# Patient Record
Sex: Female | Born: 1970 | Race: Black or African American | Hispanic: No | Smoking: Former smoker
Health system: Southern US, Community
[De-identification: ages and names within clinical notes are randomized; demographics above are authoritative.]

## PROBLEM LIST (undated history)

## (undated) DIAGNOSIS — G4730 Sleep apnea, unspecified: Secondary | ICD-10-CM

## (undated) DIAGNOSIS — M797 Fibromyalgia: Secondary | ICD-10-CM

## (undated) DIAGNOSIS — K219 Gastro-esophageal reflux disease without esophagitis: Secondary | ICD-10-CM

## (undated) DIAGNOSIS — E119 Type 2 diabetes mellitus without complications: Secondary | ICD-10-CM

## (undated) DIAGNOSIS — E079 Disorder of thyroid, unspecified: Secondary | ICD-10-CM

## (undated) DIAGNOSIS — G35 Multiple sclerosis: Secondary | ICD-10-CM

## (undated) HISTORY — PX: HERNIA REPAIR: SHX51

## (undated) HISTORY — PX: ENDOMETRIAL BIOPSY: SHX622

---

## 2003-02-01 ENCOUNTER — Emergency Department (HOSPITAL_COMMUNITY): Admission: EM | Admit: 2003-02-01 | Discharge: 2003-02-01 | Payer: Self-pay | Admitting: Emergency Medicine

## 2003-03-12 ENCOUNTER — Emergency Department (HOSPITAL_COMMUNITY): Admission: EM | Admit: 2003-03-12 | Discharge: 2003-03-12 | Payer: Self-pay | Admitting: Emergency Medicine

## 2003-04-04 ENCOUNTER — Emergency Department (HOSPITAL_COMMUNITY): Admission: EM | Admit: 2003-04-04 | Discharge: 2003-04-04 | Payer: Self-pay | Admitting: Emergency Medicine

## 2003-04-07 ENCOUNTER — Other Ambulatory Visit: Admission: RE | Admit: 2003-04-07 | Discharge: 2003-04-07 | Payer: Self-pay | Admitting: Obstetrics and Gynecology

## 2003-05-06 ENCOUNTER — Encounter: Payer: Self-pay | Admitting: Obstetrics and Gynecology

## 2003-05-06 ENCOUNTER — Ambulatory Visit (HOSPITAL_COMMUNITY): Admission: RE | Admit: 2003-05-06 | Discharge: 2003-05-06 | Payer: Self-pay | Admitting: Obstetrics and Gynecology

## 2003-07-16 ENCOUNTER — Ambulatory Visit (HOSPITAL_COMMUNITY): Admission: RE | Admit: 2003-07-16 | Discharge: 2003-07-16 | Payer: Self-pay | Admitting: Obstetrics and Gynecology

## 2003-07-16 ENCOUNTER — Encounter: Payer: Self-pay | Admitting: Obstetrics and Gynecology

## 2003-08-07 ENCOUNTER — Inpatient Hospital Stay (HOSPITAL_COMMUNITY): Admission: AD | Admit: 2003-08-07 | Discharge: 2003-08-07 | Payer: Self-pay | Admitting: Obstetrics and Gynecology

## 2003-10-20 ENCOUNTER — Inpatient Hospital Stay (HOSPITAL_COMMUNITY): Admission: AD | Admit: 2003-10-20 | Discharge: 2003-10-20 | Payer: Self-pay | Admitting: Obstetrics and Gynecology

## 2003-11-19 ENCOUNTER — Inpatient Hospital Stay (HOSPITAL_COMMUNITY): Admission: AD | Admit: 2003-11-19 | Discharge: 2003-11-19 | Payer: Self-pay | Admitting: Obstetrics and Gynecology

## 2003-11-22 ENCOUNTER — Inpatient Hospital Stay (HOSPITAL_COMMUNITY): Admission: AD | Admit: 2003-11-22 | Discharge: 2003-11-22 | Payer: Self-pay | Admitting: Obstetrics and Gynecology

## 2003-12-06 ENCOUNTER — Inpatient Hospital Stay (HOSPITAL_COMMUNITY): Admission: AD | Admit: 2003-12-06 | Discharge: 2003-12-08 | Payer: Self-pay | Admitting: Obstetrics and Gynecology

## 2004-01-10 ENCOUNTER — Other Ambulatory Visit: Admission: RE | Admit: 2004-01-10 | Discharge: 2004-01-10 | Payer: Self-pay | Admitting: Obstetrics and Gynecology

## 2004-09-18 ENCOUNTER — Encounter: Admission: RE | Admit: 2004-09-18 | Discharge: 2004-09-18 | Payer: Self-pay | Admitting: Internal Medicine

## 2004-10-18 ENCOUNTER — Encounter: Admission: RE | Admit: 2004-10-18 | Discharge: 2004-10-18 | Payer: Self-pay | Admitting: Neurology

## 2004-11-01 ENCOUNTER — Ambulatory Visit (HOSPITAL_COMMUNITY): Admission: RE | Admit: 2004-11-01 | Discharge: 2004-11-01 | Payer: Self-pay | Admitting: Neurology

## 2005-01-02 ENCOUNTER — Encounter: Admission: RE | Admit: 2005-01-02 | Discharge: 2005-02-01 | Payer: Self-pay | Admitting: Neurology

## 2005-01-10 ENCOUNTER — Other Ambulatory Visit: Admission: RE | Admit: 2005-01-10 | Discharge: 2005-01-10 | Payer: Self-pay | Admitting: Obstetrics and Gynecology

## 2005-02-11 ENCOUNTER — Ambulatory Visit: Payer: Self-pay | Admitting: Physical Medicine & Rehabilitation

## 2005-02-11 ENCOUNTER — Inpatient Hospital Stay (HOSPITAL_COMMUNITY): Admission: AD | Admit: 2005-02-11 | Discharge: 2005-02-13 | Payer: Self-pay | Admitting: Neurology

## 2005-08-15 ENCOUNTER — Emergency Department (HOSPITAL_COMMUNITY): Admission: EM | Admit: 2005-08-15 | Discharge: 2005-08-15 | Payer: Self-pay | Admitting: Emergency Medicine

## 2006-01-24 ENCOUNTER — Encounter: Admission: RE | Admit: 2006-01-24 | Discharge: 2006-01-24 | Payer: Self-pay | Admitting: Neurology

## 2006-03-07 ENCOUNTER — Encounter: Admission: RE | Admit: 2006-03-07 | Discharge: 2006-03-07 | Payer: Self-pay | Admitting: Neurology

## 2006-04-01 ENCOUNTER — Encounter: Admission: RE | Admit: 2006-04-01 | Discharge: 2006-06-30 | Payer: Self-pay | Admitting: Neurology

## 2006-04-28 ENCOUNTER — Inpatient Hospital Stay (HOSPITAL_COMMUNITY): Admission: EM | Admit: 2006-04-28 | Discharge: 2006-04-30 | Payer: Self-pay | Admitting: Emergency Medicine

## 2006-11-02 ENCOUNTER — Encounter: Admission: RE | Admit: 2006-11-02 | Discharge: 2006-11-02 | Payer: Self-pay | Admitting: *Deleted

## 2007-05-29 ENCOUNTER — Ambulatory Visit (HOSPITAL_COMMUNITY): Admission: RE | Admit: 2007-05-29 | Discharge: 2007-05-29 | Payer: Self-pay | Admitting: *Deleted

## 2007-10-26 ENCOUNTER — Encounter: Admission: RE | Admit: 2007-10-26 | Discharge: 2007-10-26 | Payer: Self-pay | Admitting: Neurology

## 2007-11-04 ENCOUNTER — Inpatient Hospital Stay (HOSPITAL_COMMUNITY): Admission: AD | Admit: 2007-11-04 | Discharge: 2007-11-04 | Payer: Self-pay | Admitting: Obstetrics and Gynecology

## 2007-12-03 ENCOUNTER — Ambulatory Visit: Payer: Self-pay | Admitting: Obstetrics & Gynecology

## 2007-12-03 ENCOUNTER — Encounter: Payer: Self-pay | Admitting: Obstetrics & Gynecology

## 2007-12-10 ENCOUNTER — Ambulatory Visit (HOSPITAL_COMMUNITY): Admission: RE | Admit: 2007-12-10 | Discharge: 2007-12-10 | Payer: Self-pay | Admitting: Obstetrics and Gynecology

## 2007-12-18 ENCOUNTER — Ambulatory Visit: Payer: Self-pay | Admitting: *Deleted

## 2008-01-29 ENCOUNTER — Ambulatory Visit: Payer: Self-pay | Admitting: Obstetrics and Gynecology

## 2008-06-13 ENCOUNTER — Inpatient Hospital Stay (HOSPITAL_COMMUNITY): Admission: EM | Admit: 2008-06-13 | Discharge: 2008-06-16 | Payer: Self-pay | Admitting: Neurology

## 2008-06-16 ENCOUNTER — Encounter: Admission: RE | Admit: 2008-06-16 | Discharge: 2008-06-16 | Payer: Self-pay | Admitting: Neurology

## 2009-01-24 ENCOUNTER — Emergency Department (HOSPITAL_BASED_OUTPATIENT_CLINIC_OR_DEPARTMENT_OTHER): Admission: EM | Admit: 2009-01-24 | Discharge: 2009-01-24 | Payer: Self-pay | Admitting: Emergency Medicine

## 2009-03-05 ENCOUNTER — Emergency Department (HOSPITAL_BASED_OUTPATIENT_CLINIC_OR_DEPARTMENT_OTHER): Admission: EM | Admit: 2009-03-05 | Discharge: 2009-03-05 | Payer: Self-pay | Admitting: Emergency Medicine

## 2009-03-07 ENCOUNTER — Other Ambulatory Visit: Admission: RE | Admit: 2009-03-07 | Discharge: 2009-03-07 | Payer: Self-pay | Admitting: Family Medicine

## 2009-04-19 ENCOUNTER — Encounter: Admission: RE | Admit: 2009-04-19 | Discharge: 2009-04-19 | Payer: Self-pay | Admitting: Neurology

## 2009-08-22 ENCOUNTER — Emergency Department (HOSPITAL_BASED_OUTPATIENT_CLINIC_OR_DEPARTMENT_OTHER): Admission: EM | Admit: 2009-08-22 | Discharge: 2009-08-22 | Payer: Self-pay | Admitting: Emergency Medicine

## 2009-09-08 ENCOUNTER — Encounter: Admission: RE | Admit: 2009-09-08 | Discharge: 2009-09-28 | Payer: Self-pay | Admitting: Neurology

## 2009-09-08 ENCOUNTER — Encounter: Admission: RE | Admit: 2009-09-08 | Discharge: 2009-09-08 | Payer: Self-pay | Admitting: Neurology

## 2010-03-25 ENCOUNTER — Emergency Department (HOSPITAL_BASED_OUTPATIENT_CLINIC_OR_DEPARTMENT_OTHER): Admission: EM | Admit: 2010-03-25 | Discharge: 2010-03-25 | Payer: Self-pay | Admitting: Emergency Medicine

## 2010-09-15 ENCOUNTER — Encounter
Admission: RE | Admit: 2010-09-15 | Discharge: 2010-09-15 | Payer: Self-pay | Source: Home / Self Care | Attending: Internal Medicine | Admitting: Internal Medicine

## 2010-10-22 ENCOUNTER — Encounter: Payer: Self-pay | Admitting: Neurology

## 2010-12-15 ENCOUNTER — Emergency Department (HOSPITAL_BASED_OUTPATIENT_CLINIC_OR_DEPARTMENT_OTHER)
Admission: EM | Admit: 2010-12-15 | Discharge: 2010-12-15 | Disposition: A | Discharge status: Home / Self Care | Payer: Medicare PPO | Attending: Emergency Medicine | Admitting: Emergency Medicine

## 2010-12-15 DIAGNOSIS — Z79899 Other long term (current) drug therapy: Secondary | ICD-10-CM | POA: Insufficient documentation

## 2010-12-15 DIAGNOSIS — M79609 Pain in unspecified limb: Secondary | ICD-10-CM | POA: Insufficient documentation

## 2010-12-15 DIAGNOSIS — L089 Local infection of the skin and subcutaneous tissue, unspecified: Secondary | ICD-10-CM | POA: Insufficient documentation

## 2010-12-17 LAB — BASIC METABOLIC PANEL
BUN: 9 mg/dL (ref 6–23)
CO2: 33 mEq/L — ABNORMAL HIGH (ref 19–32)
Chloride: 103 mEq/L (ref 96–112)
Creatinine, Ser: 0.8 mg/dL (ref 0.4–1.2)
Potassium: 3.9 mEq/L (ref 3.5–5.1)

## 2011-01-03 LAB — URINALYSIS, ROUTINE W REFLEX MICROSCOPIC
Ketones, ur: 15 mg/dL — AB
Specific Gravity, Urine: 1.039 — ABNORMAL HIGH (ref 1.005–1.030)

## 2011-01-03 LAB — PREGNANCY, URINE: Preg Test, Ur: NEGATIVE

## 2011-01-08 LAB — URINE CULTURE

## 2011-01-08 LAB — COMPREHENSIVE METABOLIC PANEL
ALT: 7 U/L (ref 0–35)
Calcium: 8.9 mg/dL (ref 8.4–10.5)
Creatinine, Ser: 0.8 mg/dL (ref 0.4–1.2)
GFR calc Af Amer: >60 mL/min (ref >60)
Glucose, Bld: 79 mg/dL (ref 70–99)
Sodium: 145 mEq/L (ref 135–145)
Total Protein: 8.4 g/dL — ABNORMAL HIGH (ref 6.0–8.3)

## 2011-01-08 LAB — URINALYSIS, ROUTINE W REFLEX MICROSCOPIC
Bilirubin Urine: NEGATIVE
Nitrite: NEGATIVE
Specific Gravity, Urine: 1.029 (ref 1.005–1.030)
Urobilinogen, UA: 0.2 mg/dL (ref 0.0–1.0)
pH: 5.5 (ref 5.0–8.0)

## 2011-01-08 LAB — URINE MICROSCOPIC-ADD ON

## 2011-01-08 LAB — PREGNANCY, URINE: Preg Test, Ur: NEGATIVE

## 2011-01-08 LAB — CBC
HCT: 34.7 % — ABNORMAL LOW (ref 36.0–46.0)
MCHC: 32.6 g/dL (ref 30.0–36.0)

## 2011-01-08 LAB — DIFFERENTIAL
Eosinophils Absolute: 0 10*3/uL (ref 0.0–0.7)
Lymphocytes Relative: 45 % (ref 12–46)
Lymphs Abs: 2.8 10*3/uL (ref 0.7–4.0)
Monocytes Relative: 9 % (ref 3–12)
Neutrophils Relative %: 45 % (ref 43–77)

## 2011-02-13 NOTE — Group Therapy Note (Signed)
NAME:  [name]       ACCOUNT NO.:  [id number]   MEDICAL RECORD NO.:  [id number]          PATIENT TYPE:  WOC   LOCATION:  [facility]                   FACILITY:  [facility]   PHYSICIAN:  Allie Bossier, MD        DATE OF BIRTH:  [birth year]   DATE OF SERVICE:  12/03/2007                                  CLINIC NOTE   [name] as a 40 year old divorced black gravida 1, para 1, with a 4-year-  old daughter, who complains of heavy irregular and long periods since  the birth of her daughter 4 years ago.  Prior to that, she was on oral  contraceptive pills and had regular periods.  She has also had a  lifelong issue with hirsutism, and she was seen in the [facility] for this  bleeding on November 04, 2007.  At that time, she was slightly anemic,  with hemoglobin being 10.6.  She was given a Depo-Provera injection and  a appropriate prescription for p.o. Provera.   PAST MEDICAL HISTORY:  1. Morbid obesity.  2. Hirsutism.  3. Multiple sclerosis diagnosed by Dr. Avie George.  4. Cervical dysplasia at age 9.  5. Anemia.  6. Menorrhagia.   PAST SURGICAL HISTORY:  1. Umbilical hernia repair.  2. Cryosurgery.   ALLERGIES:  No allergies to medicines.  No latex allergies.   REVIEW OF SYSTEMS:  She had a Pap smear in 2007 at Dr. Ashley George' office  that she reports as normal.  She is sexually active and uses condoms  100% of the time.  She is on disability secondary to multiple sclerosis.  She has been monogamous for less than a month, and has lost 10 pounds  recently.   PHYSICAL EXAMINATION:  VITAL SIGNS:  Weight 295 pounds, height 5 feet 4  inches, pulse 97, blood pressure 127/74.  HEENT:  Normal.  HEART:  Regular rate and rhythm.  BREASTS:  Normal bilaterally.  ABDOMEN:  Obese.  No palpable organs.  PELVIC:  External genitalia normal.  Cervix appears normal.  Bimanual  exam:  I am not able to palpate any masses.   ASSESSMENT AND PLAN:  Menorrhagia, with resulting anemia, along with  hirsutism and morbid obesity.  It is quite likely that she has  polycystic ovarian syndrome.  I certainly would recommend weight loss as  general health maintenance exam.  For her gynecology health, I have  checked a Pap smear.  With regard to her bleeding, I have  checked a repeat CBC and a TSH, as well as cervical cultures and a  random glucose due to her morbid obesity.  She will follow up in several  weeks.      Allie Bossier, MD     MCD/MEDQ  D:  12/18/2007  T:  12/18/2007  Job:  [id number]

## 2011-02-13 NOTE — Group Therapy Note (Signed)
[name], [name]       ACCOUNT NO.:  [id number]   MEDICAL RECORD NO.:  [id number]          PATIENT TYPE:  WOC   LOCATION:  [facility]                   FACILITY:  [facility]   PHYSICIAN:  Karlton Lemon, MD      DATE OF BIRTH:  1971-08-22   DATE OF SERVICE:                                  CLINIC NOTE   CHIEF COMPLAINT:  Follow-up of results.   HISTORY OF PRESENT ILLNESS:  This is a 40 year old, gravida 1, para 1  with a history of menorrhagia, seen by Dr. Marice Potter on December 03, 2007.  She  states that her bleeding continued after she saw Dr. Marice Potter, until December 12, 2007.  She had bled for approximately 2 1/2 weeks at that time.  She  has a history of missed periods and then heavy bleeding in the past.  Her assessment involved Pap smear, which was negative for  intraepithelial lesion, ultrasound showing no evidence of fibroids.  The  uterus appeared normal, the ovaries appeared normal and there were no  adnexal masses.  She had DC and chlamydia that was negative.  Hemoglobin  10.0, platelets 337, glucose 79, TSH 1.278.  White blood count was  normal.  The patient has been counseled about these results.  She has  been started on slow FE iron replacement for her anemia.   OBSTETRIC HISTORY:  She is gravida 1, para 1.   PAST MEDICAL HISTORY:  She has multiple sclerosis, fibromyalgia and  arthritis and menorrhagia.   PHYSICAL EXAMINATION:  Well appearing female, in no distress.  Temperature 98.5, pulse 84, blood pressure 118/78, weight 292.5.   ASSESSMENT AND PLAN:  The patient is here for results and counseling of  labs performed before.  She is a 40 year old gravida 1, para 1 with  menorrhagia.  She was previously normal when she was on birth control.  The results of her testing from her previous visits are normal, except  for mild to moderate anemia.  She has been started on iron for this iron  deficiency anemia.  She will be started on oral contraceptive pills,  Sprintec to help  control her cycle.  She has been instructed to start  this Sunday and she is to follow-up in 3 months to assess how well this  method is doing to control her periods.           ______________________________  Karlton Lemon, MD     NS/MEDQ  D:  12/18/2007  T:  12/18/2007  Job:  [id number]

## 2011-02-13 NOTE — Discharge Summary (Signed)
[name], [name]       ACCOUNT NO.:  [id number]   MEDICAL RECORD NO.:  [id number]          PATIENT TYPE:  INP   LOCATION:  3004                         FACILITY:  [facility]   PHYSICIAN:  Genene Churn. Love, M.D.    DATE OF BIRTH:  01/06/1971   DATE OF ADMISSION:  06/13/2008  DATE OF DISCHARGE:  06/16/2008                               DISCHARGE SUMMARY   This is one of multiple [facility] admissions for this 40-year-  old right-handed African American single female admitted from the  emergency room on June 13, 2008, by Dr. Noel Christmas for  suspected multiple sclerosis attack.   HISTORY OF PRESENT ILLNESS:  [name] has a history of optic neuritis  in the past and transverse myelitis with chronic gait disorder and  intermittent speech difficulties secondary to multiple sclerosis.  She  has had documented lesions in the brain and the spinal cord.  She was  initially treated with Avonex therapy and subsequently switched to Rebif  because of worsening condition.  She was in her usual state of health,  walking with a walker and independent activities of daily living when  she developed worsening gait and speech difficulties on June 12, 2008.  She was seen in the emergency room by Dr. Noel Christmas on  June 13, 2008, and admitted for evaluation.  She had no associated  Lhermitte sign, or bowel or bladder symptomatology.   PAST MEDICAL HISTORY:  1. Pain in her joints, arms, and legs thought to represent      fibromyalgia.  2. Obesity.  3. Obstructive sleep apnea syndrome.  4. Right proximal leg discomfort thought to represent meralgia      paresthetica.  5. Umbilical hernia repair.  6. Anxiety disorder.   MEDICATIONS AT THE TIME OF ADMISSION INCLUDED:  1. Provigil 200 mg q.a.m.  2. Rebif 44 mcg on Mondays, Wednesdays, and Fridays.  3. Skelaxin 400 mg t.i.d.  4. Effexor XR 75 mg daily.  5. Baclofen 30 mg t.i.d.  6. Lyrica 100 mg t.i.d.  7. Aleve  220 mg two tablets daily.  8. OxyContin 20 mg in the morning and 10 mg in the evening.   PHYSICAL EXAMINATION:  VITAL SIGNS:  At the time of her admission  revealed temperature of 98.5; pulse 85, markedly obese female in no  acute distress with blood pressure of 131/91.  GENERAL:  She was oriented to x3.  She follows 1, 2, and 3-step  commands.  HEENT:  She has difficulty with speech output, had foreign accent  syndrome.  She left out articles in her speech.  Her visual fields were  full.  The extraocular movements were full.  Corneal's are present.  Hearing was intact.  Air conduction greater than bone conduction.  NEUROLOGIC:  Motor examination revealed she had ataxia in both upper and  lower extremities with 1 to 2+ deep tendon reflexes.  Plantar responses  downgoing.  EXTREMITIES:  Normal according to Dr. Roseanne Reno when she was admitted.   LABORATORY DATA:  CBC with white blood cell count of 11,000, hemoglobin  10.2, hematocrit 32.1, and platelet count 328,000.  Protime was  14.0  with an INR of 1.1, and PTT was 27.  Composite metabolic panel reveals  sodium of 139, potassium 3.6, chloride 106, CO2 content 27, glucose 81,  and BUN 9.  Liver function tests were normal.  Albumin was 3.3 and  calcium was 8.8.  Chest x-ray with 2 views on June 13, 2008, showed  no evidence of active disease.   HOSPITAL COURSE:  The patient was admitted with suspected acute attack  of demyelinating disease and placed on IV Solu-Medrol 1 g daily for 3  days to be followed by p.o. prednisone taper.  Because of her size, she  could not have an MRI study in the hospital and it was set up as an  outpatient for [facility] imaging to be performed of the brain and of  the cervical spine without and with contrast enhancement.  During her  hospital course, she was treated with Lovenox 40 mg subcu daily and it  was noted that she had been again developing improvement in her speech  following Solu-Medrol and  was able to walk with a cane following the  second dosage.  She used her CPAP machine in the hospital for her  obstructive sleep apnea syndrome.   IMPRESSION:  1. Multiple sclerosis, code 340.  2. Obstructive sleep apnea syndrome, code 780.57.  3. Obesity, code 278.01.   PLAN AT THIS TIME:  Discharge the patient for an MRI this day on  baclofen 30 mg t.i.d.; Provigil 200 mg daily; Skelaxin 400 mg t.i.d.;  OxyContin 20 mg in the morning and 10 mg at night; Effexor XR 75 mg  daily; Lyrica 100 mg t.i.d.; and Protonix 80 mg daily; Rebif 44 mcg  subcu Mondays, Wednesdays, and Fridays; prednisone taper 60 mg daily for  3 days, 40 mg daily for 3 days, and then 20 mg daily for 4 days.  She  will return to the office in 10 days for followup evaluation.  Pending  studies at the time of discharge will be a serum for interferon  antibodies.           ______________________________  Genene Churn. Sandria Manly, M.D.     JML/MEDQ  D:  06/16/2008  T:  06/16/2008  Job:  [id number]

## 2011-02-13 NOTE — H&P (Signed)
NAME:  [name], [name]       ACCOUNT NO.:  [id number]   MEDICAL RECORD NO.:  [id number]          PATIENT TYPE:  WOC   LOCATION:  [facility]                          FACILITY:  [facility]   PHYSICIAN:  Noel Christmas, MD    DATE OF BIRTH:  09/30/1971   DATE OF ADMISSION:  DATE OF DISCHARGE:                              HISTORY & PHYSICAL   This is a 40 year old African-American lady with a history of multiple  sclerosis, the patient of Dr. Sandria Manly, who began experiencing speech  difficulty yesterday as well as difficulty walking.  Symptoms were worse  on waking up today.  She has experienced problems with getting words  out, in addition to requiring assistance with ambulation including with  use of her cane.  She has not experienced any numbness involving the  right side.  She has known demyelinating lesions involving brain and  spinal cord and has a history of transverse myelitis.  Her last  admission for multiple sclerosis exacerbation was in July 2007.  She had  a good response to a brief treatment of high dose Solu-Medrol followed  by prednisone taper as an outpatient.  This is her first exacerbation  apparently since the hospitalization.  The patient has a history also of  fibromyalgia and chronic pain.  She has been on Rebif 44 mcg on Monday,  Wednesday, and Friday of each week.  She typically walks without a cane  unless she has problems with hip or lower extremity pain otherwise.  She  has not developed any bowel or bladder control problems.  The patient  has not experienced swallowing difficulty.   PAST MEDICAL HISTORY:  In addition to multiple sclerosis is remarkable  for recurrent low back pain as well as fibromyalgia, chronic obesity,  umbilical hernia repair, and chronic anxiety disorder as well as chronic  depression.   CURRENT MEDICATIONS:  1. Provigil 200 mg q.a.m.  2. Rebif 44 mcg on Monday, Wednesday, and Friday of each week.  3. Skelaxin 400 mg t.i.d.  4. Effexor XR 75  mg per day.  5. Baclofen 30 mg t.i.d.  6. Lyrica 100 mg t.i.d.  7. Aleve 220 mg 2 tablets daily.  8. OxyContin 20 mg q.a.m. and 10 mg q.p.m.   FAMILY HISTORY:  Noncontributory.   REVIEW OF SYSTEMS:  Negative except for chronic somatic pain syndrome  associated with fibromyalgia and chronic low back pain in addition to  obesity.   PHYSICAL EXAMINATION:  VITAL SIGNS:  Temperature is 98.5, pulse was 85  per minute, respirations 18 per minute, and blood pressure was 131/91.  GENERAL:  The appearance was that of a markedly obese young lady who is  alert and cooperative in no acute distress.  She was oriented to time as  well as place.  She had significant problems with speech output, but  clearly knew what she wanted to say.  There were no frank paraphasic  errors.  She had no difficulty understanding spoken language.  HEAD, EYES, EARS, NOSE, AND THROAT:  Remarkable.  NECK:  Supple with no masses or tenderness.  CHEST:  Clear to auscultation.  CARDIAC:  Reveals normal rate and  rhythm and normal heart sounds.  ABDOMEN:  Soft and distended.  Bowel sounds were normal.  EXTREMITIES:  Obese and symmetrical.  She had normal peripheral pulses  including dorsalis pedis pulses in both feet which were 2+.  GENITALS and RECTUM:  Deferred.   Her pupils reacted normally to light.  Extraocular movements and visual  fields were normal.  There was no facial weakness.  Hearing was normal.  Speech as described above was very hesitant and there was spasticity of  speech output along with some frustration and not being able to get out  what she wanted to say.  Palette and movement was symmetrical.  Coordination of extremities was normal.  Motor exam showed mild hip  flexor weakness on the right compared to the left.  She had equivocal  quadriceps weakness on the right as well compared to the left.  Tibialis  anterior and gastrocnemius strength was normal.  Strength of her right  upper extremity and left  upper and lower extremities was normal.  Muscle  tone was normal throughout.  Deep tendon reflexes were 2+, brisk, and  symmetrical in the upper extremities throughout the knees and 2+ of  ankles.  Plantar responses were flexor.  Sensory examination was normal.  Carotid auscultation was normal.   CLINICAL IMPRESSION:  1. Exacerbation of multiple sclerosis manifested by speech difficulty      and gait instability acutely.  2. Chronic fibromyalgia.  3. Chronic obesity.   PLAN:  1. IV administration of Solu-Medrol 1 g per day with total of 3 doses      followed by prednisone taper as an outpatient.  2. Physical therapy intervention for gait evaluation recommendations.  3. Speech therapy intervention for evaluation and recommendations as      well for speech output difficulty.      Noel Christmas, MD  Electronically Signed     CS/MEDQ  D:  06/13/2008  T:  06/14/2008  Job:  [id number]

## 2011-02-13 NOTE — Group Therapy Note (Signed)
NAMEDAVINE, SWENEY       ACCOUNT NO.:  [id number]   MEDICAL RECORD NO.:  [id number]          PATIENT TYPE:  WOC   [facility]ATION[facility]                   [facility]AC[facility]   PHYSICIAN:  Argentina Donovan, MD        DATE OF BIRTH:  20-Jan-1971   DATE OF SERVICE:  01/29/2008                                  CLINIC NOTE   The patient is a 40 year old English speaking Hispanic female, gravida  1, para 1-0-0-1 with multiple sclerosis for which she is on many  medications.  She comes in because she wants an antiviral to suppress  her recurrent episodes of herpes; however, she has only two within a  year.  I have talked to her about the possibility of using Zovirax, and  she wants a prescription for that.  She also has had problems with Depo-  Provera and birth control pills with breakthrough bleeding, possibly due  to her weight of 297 pounds.  I have convinced her to fill out the  papers for the [facility] program and see if we did a Mirena IUD for her which  I think would be better.  Her third complaint is a knot on her labia  which is nontender.  We examined that, and it turned out to be a small  sebaceous cyst.   IMPRESSION:  Multiple sclerosis with morbid obesity.   PLAN:  Uterine contraceptive device, i.e., Mirena if the patient  qualifies and a prescription for Valtrex.           ______________________________  Argentina Donovan, MD     PR/MEDQ  D:  01/29/2008  T:  01/29/2008  Job:  [id number]

## 2011-02-16 NOTE — Discharge Summary (Signed)
NAME:  [name], [name]                       ACCOUNT NO.:  [id number]   MEDICAL RECORD NO.:  [id number]                   PATIENT TYPE:  INP   LOCATION:  9109                                 [facility]   PHYSICIAN:  James A. Ashley Royalty, M.D.             DATE OF BIRTH:  [birth year]   DATE OF ADMISSION:  12/06/2003  DATE OF DISCHARGE:  12/08/2003                                 DISCHARGE SUMMARY   DISCHARGE DIAGNOSES:  1. Intrauterine pregnancy at term, delivered.  2. Spontaneous rupture of membranes.  3. Group B Streptococcus carrier.  4. Term birth living child, vertex.   OPERATIONS AND SPECIAL PROCEDURES:  OB delivery with episiotomy and  episiorrhaphy.   COMPLICATIONS OF DELIVERY:  Shoulder dystocia - relieved by extending the  episiotomy, suprapubic pressure, and McRoberts maneuver.   CONSULTATIONS:  None.   DISCHARGE MEDICATIONS:  Tylenol.   HISTORY AND PHYSICAL:  This is a 40 year old primigravida, 2 days before her  due date.  Prenatal care complicated by group B strep positive and apparent  cervical stenosis.  The patient presented complaining of labor with onset at  4 a.m.  For the remainder of the history and physical please see chart.   HOSPITAL COURSE:  The patient was admitted to [facility] of  [place].  Admission laboratory studies were drawn.  Initial cervical  examination revealed the cervix to be closed, 90-100% effaced, -1 station,  vertex presentation.  It felt stenotic.  Spontaneous rupture of membranes  was documented.  The patient went on to labor.  On December 06, 2003 at 3:05  p.m. she was noted to be 5 cm, 100% effaced, +1 station, vertex  presentation.  A forebag was ruptured and revealed meconium.  Amnioinfusion  was initiated.  The patient went on to deliver at 6:29 p.m. on December 06, 2003.  The infant was an 8-pound 1-ounce female, Apgars 8 at one minute and  9 five minutes, sent to newborn nursery.  Delivery was complicated by  shoulder  dystocia which was relieved by extending the episiotomy, suprapubic  pressure, and McRoberts maneuvers.  The infant was subjected to DeLee  suction on the perineum.  Pediatrics team was present at delivery.  Arterial  cord pH was obtained and the value was 7.31.  The patient's postpartum  course was benign.  She was discharged home on postpartum day #2, afebrile  and in satisfactory condition.   ACCESSORY CLINICAL FINDINGS:  Hemoglobin and hematocrit on admission were  11.3 and 35.1 respectively.  Repeat values obtained on December 08, 2003 were  8.9 and 27.3 respectively.  White blood cell count on the day of discharge  was 9600.   DISPOSITION:  The patient is to [facility] in 4-6  weeks for postpartum evaluation.  James A. Ashley Royalty, M.D.    [name]  D:  12/29/2003  T:  12/29/2003  Job:  [id number]

## 2011-02-16 NOTE — Discharge Summary (Signed)
[name], [name]       ACCOUNT NO.:  [id number]   MEDICAL RECORD NO.:  [id number]          PATIENT TYPE:  INP   LOCATION:  5015                         FACILITY:  [facility]   PHYSICIAN:  Genene Churn. Love, M.D.    DATE OF BIRTH:  07-04-1971   DATE OF ADMISSION:  02/11/2005  DATE OF DISCHARGE:                                 DISCHARGE SUMMARY   PATIENT ADDRESS:  [address]  [place], [place]  [id number].   This is one of several [facility] admissions for this 40 year old  right-handed African-American female from [place], [place],  admitted for evaluation of bilateral lower extremity pain.   HISTORY OF PRESENT ILLNESS:  [name] has a known history of  multiple sclerosis complicated by a gait disorder.  She has recently  developed problems with pain occurring in her joints with swelling, on a  tapering course of steroids for treatment of multiple sclerosis.  She was  admitted by Dr. Vickey Huger on Feb 11, 2005, to [facility] with  complaints of difficulty walking.  There was no history of single-eye visual  loss, double vision, hiccups, swallowing problems, etc.  She was admitted  for treatment with IV steroids, considering this possibly an MS attack.  She  had no known history of alcoholism or cigarette use and has not had any  serious history of medical problems.  She has had a past history of an  intrauterine pregnancy which delivered in March 2005, a group B  streptococcus carrier and multiple sclerosis.  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  Her other  details of her history have been dictated elsewhere.   PHYSICAL EXAMINATION:  VITAL SIGNS:  At the time of admission, her vital  signs were normal.  There was some difficulty in feeling a pulse in her left  radial, but she felt pulses in the popliteal.  MENTAL STATUS:  Her speech revealed a foreign accent syndrome,  which sounded  almost [place].  NEUROLOGICAL:  Her cranial nerve examination revealed the visual fields to  be full.  Discs were flat with spontaneous venous pulsations seen.  Extraocular movements were full, and corneals were present.  There was no  facial motor asymmetry.  Hearing was present with air conduction greater  than bone conduction.  Tongue was midline.  The uvula was midline.  Gags  were present.  Motor examination revealed 5/5 strength in the upper and  lower extremities.  She complained of pain in her wrists and in her ankles  bilaterally.  Her strength was in the 5/5 range.  Deep tendon reflexes were  2+, and plantar responses were downgoing.  GENERAL:  Examination was unremarkable.   LABORATORY DATA:  Her white blood cell count was 16,200 while on steroids,  hemoglobin 12.8, hematocrit 38.5, platelet count 353K.  The differential was  69% polys, 25% lymphs and 5% monocytes.  Sed rate was about 25.  ANA is  pending, but a previous ANA, about four months ago, was unremarkable.  Initial sodium was 134, potassium 4.1, chloride 104, CO2 content 24, glucose  199 with a repeat of 157, BUN 13, creatinine 1.0, calcium 8.5.  Uric acid  5.1.  Serum protein electrophoresis unremarkable.  Her CRP was high with  sensitivity 29.2.  Urine drug screen was unremarkable.  Urinalysis was  negative.  RA factor was less than 20.  Bone scan was negative.  X-rays of  the lower extremities revealed a right ankle medial malleolar fracture which  was old.  Chest x-ray showed no significant abnormalities.   HOSPITAL COURSE:  The patient was thought most likely to have had a  tenosynovitis versus MS exacerbation and was treated with 500 mg of IV Solu-  Medrol q.d. x 3 days with definite improvement in symptomatology.  She was  seen by physical therapy, who felt that she was ambulating 200 feet and  would do well as an outpatient.  She was seen in consultation by Dr.  Jimmy Footman from rheumatology,  who recommended that her diagnosis was  synovitis of the ankles and considered doing a Parvovirus.  ANA is pending  at the time of this dictation.   IMPRESSION:  1.  Bilateral lower extremity pain, code 729.5, most likely representing      tenosynovitis.  2.  Multiple sclerosis, code 340.  3.  Obesity, code 278.01.   PLAN:  At this time is to discharge the patient on a tapering course of  prednisone 60 mg q.d. for 2 days, 40 mg q.d. for 3 days and 20 mg q.d. for 3  days; Cymbalta 30 mg q.h.s. is added to the regimen; Avonex 30 mcg IM q.  weekly; Flexeril 10 mg up to t.i.d. for pain.  She will be out of work until  Feb 19, 2005, and then begin six-hour days.  She will return to me on Feb 20, 2005, for further evaluation.  Pending studies at this time are  Parvovirus and ANA.      JML/MEDQ  D:  02/13/2005  T:  02/13/2005  Job:  [id number]   cc:   Marcene Duos, M.D.  [place]  [place]  [place] [address]  Fax: [phone]

## 2011-02-16 NOTE — H&P (Signed)
NAME:  [name], [name]                       ACCOUNT NO.:  [id number]   MEDICAL RECORD NO.:  [id number]                   PATIENT TYPE:  MAT   LOCATION:  MATC                                 FACILITY:  [facility]   PHYSICIAN:  Timothy P. Fontaine, M.D.           DATE OF BIRTH:  17-Sep-1971   DATE OF ADMISSION:  08/07/2003  DATE OF DISCHARGE:                                HISTORY & PHYSICAL   CHIEF COMPLAINT:  Cough, sputum.   HISTORY OF PRESENT ILLNESS:  A 40 year old G1, P0 female at approximately 21  to 22 weeks with a 1 week history of increasing cough and sputum production.  The patient notes that her sputum is clear and she has been having a  worsening cough since its onset about a week ago. She has no fevers or  chills. No nausea, vomiting, diarrhea or constipation. She has no urinary  tract symptoms and no symptoms attributable to the pregnancy such as  cramping, vaginal bleeding or discharge changes. The patient actually was  evaluated a family practitioner with auscultation and was told that her  lungs were clear 2 days ago and she presented because she was taking  Robitussin and did not seem to be getting better. For the remainder of her  past history, see her [facility].   PHYSICAL EXAMINATION:  VITAL SIGNS:  Afebrile. Vital signs are stable.  HEENT:  Normal.  LUNGS:  Clear.  CARDIAC:  Regular rate without rubs, murmurs, or gallops.  ABDOMEN:  Appropriate uterine size, gestational age with positive fetal  heart tones. Soft, nontender without masses, guarding or rebound or  organomegaly.  PELVIC:  Speculum of pelvic shows normal appearing discharge. Cervix is long  and closed. GC and Chlamydia screen done.   LABORATORY DATA:  Urinalysis shows greater than 80 ketones with moderate  leukocyte esterase. Microscopic shows 3 to 6 WBC's, 0 to 2 RBC's, few  bacteria, few squamous and trichomonas.   ASSESSMENT:  Viral upper respiratory infection. Lungs are clear. Discussed  with  patient, the appropriate over-the-counter medications and she is going  to try these to see if this does not help. IV hydration x1 liter due to the  ketonuria, although the patient does note that she has been drinking. Will  go ahead and hydrate her. Low level changes on her urinalysis. Will go ahead  and check a urine culture. Do not feel strong enough that we need to treat  at this time as she is asymptomatic and again, the urinalysis is low level.  Will go ahead and check culture and then treat as appropriate. Lastly, she  does have trichomonas on her urinalysis and I discussed this with her. A GC  and Chlamydia screen was done and I will go ahead and treat her with Flagyl  2 gram single dose after appropriate  discussion with her as far as the risks and benefits of mediation exposure  during pregnancy, which she understands  and accepts. The patient's questions  are answered to her satisfaction and she will followup with Dr. Ashley Royalty  after this coming week and they will followup on the culture results, both  GC and Chlamydia, as well as her urine culture.                                               Timothy P. Audie Box, M.D.    TPF/MEDQ  D:  08/07/2003  T:  08/07/2003  Job:  [id number]   cc:   Fayrene Fearing A. Ashley Royalty, M.D.  [address]  [place], [place] [address]  Fax: [phone]

## 2011-02-16 NOTE — Op Note (Signed)
NAMECHELCY, BOLDA             ACCOUNT NO.:  [id number]   MEDICAL RECORD NO.:  [id number]          PATIENT TYPE:  OUT   LOCATION:  [facility]C                          FACILITY:  [facility]   PHYSICIAN:  Genene Churn. Love, M.D.    DATE OF BIRTH:  28-Jan-1971   DATE OF PROCEDURE:  11/01/2004  DATE OF DISCHARGE:                                 OPERATIVE REPORT   CLINICAL INFORMATION:  This patient has had symptoms of radiculopathy and is  being evaluated for the possibility of a demyelinating disorder.   PHYSICIAN:  Genene Churn. Love, M.D.   DESCRIPTION OF PROCEDURE:  The patient was prepped and draped in the left  lateral decubitus position using Betadine and 1% Xylocaine.  L4-5 interspace  was entered.  She noted some numbness initially in her left foot along the  sole, and this was repositioned.  Opening pressure was 210 mmH20.  Initially  one drop of blood that was sort of pink, but clear CSF was obtained and sent  for protein, glucose, cell count, differential, angiotensin converting  enzyme, IgG, oligoclonal IgG, anti-DNA and anti-neuronal DNA.  The patient  tolerated the procedure well.      JML/MEDQ  D:  11/01/2004  T:  11/01/2004  Job:  [id number]

## 2011-02-16 NOTE — Discharge Summary (Signed)
NAMEANAYI, [name]       ACCOUNT NO.:  [id number]   MEDICAL RECORD NO.:  [id number]          PATIENT TYPE:  INP   LOCATION:  3003                         FACILITY:  [facility]   PHYSICIAN:  Genene Churn. Love, M.D.    DATE OF BIRTH:  01-07-1971   DATE OF ADMISSION:  04/28/2006  DATE OF DISCHARGE:  04/30/2006                                 DISCHARGE SUMMARY   This is one of several [facility] admissions for this 40 year old  right-handed black female with a history of multiple sclerosis. She has a  past history of optic neuritis and symptoms of transverse myelitis with pain  in her legs at times thought to represent fibromyalgia and followed by Dr.  Jimmy Footman. She has had documented lesions of the brain and spinal cord with  recent study in June 2007 showing a new lesion in the left medullary portion  of the brain stem. At that time she was switched from Avonex to Rebif. She  has been on disability because of discomfort in her legs from suspected  fibromyalgia versus multiple sclerosis. Over the last week-and-a-half she  has had difficulty with lower back pain. She awoke early on the morning of  April 28, 2006 having fallen out of bed and noted initially that she had  numbness in both legs which resolved in the right leg, and she was left with  numbness in her left leg. She had no associated bowel or bladder  dysfunction. She came to the emergency room and was admitted by Dr. Anne Hahn.   PAST MEDICAL HISTORY:  Significant for multiple sclerosis, new onset left  greater than right lower extremity numbness, fibromyalgia, obesity,  umbilical hernia repair, anxiety, and depression.   MEDICATIONS ON ADMISSION:  Rebif 44 mcg three times per week, Lexapro 20 mg  once per day, Baclofen 20 mg three times per day, Skelaxin 400 mg three  times per day, vitamin D supplement, Lexapro 100 mg t.i.d.   Other details of her history have been dictated elsewhere.   PHYSICAL EXAMINATION:   NEUROLOGICAL:  Examination at time of admission  revealed that her strength was good in her lower extremities. She had pain  with elevation of her left leg. She had altered sensation of her left leg  all the way to the hip level. Otherwise, examination was unremarkable.   LABORATORY DATA:  Sodium 436, potassium 3.7, chloride 100, CO2 31, glucose  81, BUN 8, creatinine 0.9. Calcium 8.4. Total protein 6.7. Albumin 3.2. SGOT  23, SGPT 17. Alk phos 58, total bilirubin 0.7. White blood cell count 8900,  hemoglobin 11.7, hematocrit 35.7, platelet count 256K. Differential was 63%  polys, 31% lymphs, 5% monocytes, 1% eosinophils. Sed rate was elevated at  47.   X-rays of the chest, lumbar spine, and left hip showed no evidence of any  significant abnormalities.  An MRI study of the lumbar spine without and  with contrast enhancement showed no significant abnormalities. EKG showed  normal sinus rhythm and was in normal EKG. Urinalysis is pending at the time  of dictation.   HOSPITAL COURSE:  The patient was admitted and placed on high-dose IV  Solu-  Medrol. Has had significant improvement in symptomatology. Initial concerns  were that she had injured herself with a fall out of bed. Other  possibilities was an acute MS attack. Recent complaints of back pain and  elevated sed rate raise the question of fibromyalgia as a potential cause  for her discomfort. In the hospital her numbness resolved in the left leg,  and she was able to walk in the room without assistance. She was taking  oxycodone without Tylenol for pain relief.   IMPRESSION:  1.  Low back pain, code 724.4.  2.  Left leg numbness, code 782.0.  3.  Multiple sclerosis with active disease by MRI study June 2007, code 340.  4.  Obesity, code 278.01.  5.  Depression, code 311.  6.  Fibromyalgia, code 729.1.   PLAN:  At this time is discharge the patient on tapering course of  prednisone and oxycodone 10 mg q.4 h.  p.r.n. pain. Return  to me in two  weeks as an outpatient for further evaluation.   DISCHARGE MEDICATIONS:  Rebif 44 mcg subcu three times a week, Lexapro 20 mg  daily, Baclofen 20 mg t.i.d., Skelaxin 400 mg three times per day, vitamin D  supplement, prednisone 10 mg, five day steroid taper, oxycodone 10 mg q.4 h.  p.r.n. pain, Tums or antacids daily, and Lyrica 100 mg t.i.d.   She is discharged improved from prehospital status with urinalysis pending  at the time of dictation.           ______________________________  Genene Churn. Sandria Manly, M.D.     JML/MEDQ  D:  04/30/2006  T:  04/30/2006  Job:  [id number]

## 2011-02-16 NOTE — H&P (Signed)
NAMERONA, TOMSON       ACCOUNT NO.:  [id number]   MEDICAL RECORD NO.:  [id number]          PATIENT TYPE:  INP   LOCATION:  1828                         FACILITY:  [facility]   PHYSICIAN:  Marlan Palau, M.D.  DATE OF BIRTH:  10/30/1970   DATE OF ADMISSION:  04/28/2006  DATE OF DISCHARGE:                                HISTORY & PHYSICAL   NEUROLOGY ADMISSION NOTE   HISTORY OF PRESENT ILLNESS:  [name] is a 40 year old, right-  handed, black female born 1970/12/10, with a history of multiple  sclerosis followed by Dr. Sandria Manly.  This patient has had optic neuritis in the  past and central complaints, also felt to have fibromyalgia.  The patient  has recently been documented to have acute lesions in the brain and spinal  cord by MRI studies and was switched from Avonex to Rebif.  The patient has  been on disability for the last two to three months because of this.  The  patient comes in at this point with a one week history of severe back pain  and pain down the left leg.  Dr. Sandria Manly had set the patient up to have an MRI  scan of the lumbosacral spine to be done on the 29th of July, but patient  comes into the emergency room at this point for evaluation of gait disorder.  The patient claims that she has been fully ambulatory up until last evening.  The patient apparently fell out of bed around 1 or 2 in the morning.  The  patient has had noted numbness of both legs when she fell, but notes that  the right leg has improved.  The left leg has remained quite numb, cannot  feel the leg at all, has severe pain in the back and down the left leg at  this point, and she cannot walk at this time.  The patient denies any  problems controlling the bowels or the bladder, the patient is not sure  whether there may be some weakness present or not.  The patient denies any  neck pain or pain in the arms, numbness in the arms.  The patient comes to  the emergency room for an  evaluation of the worsening problems with pain in  the back and leg, the difficulty with ambulation, and numbness in the leg.  The patient claims the numbness includes the entirety of the leg up into the  hip area, does not include the body.   PAST MEDICAL HISTORY:  1.  History of multiple sclerosis.  2.  New onset of left greater than right lower extremity numbness, weakness,      severe back pain, and left leg pain.  3.  Fibromyalgia.  4.  Obesity.  5.  Umbilical hernia repair.  6.  Anxiety disorder.   CURRENT MEDICATIONS:  1.  Rebif three times a week.  2.  Lexapro 20 mg a day.  3.  Baclofen 20 mg three times a day.  4.  Skelaxin 400 mg three times a day.  5.  Vitamin D supplementation.   The patient has no known drug allergies.  Does  not smoke or drink.   SOCIAL HISTORY:  This patient lives in the [place], [place], area,  is on short-term disability, has a 68-year-old child, is divorced.   FAMILY MEDICAL HISTORY:  Father died with lung cancer.  Mother is alive with  hypertension and degenerative arthritis.  The patient has one brother, who  has degenerative arthritis.  One sister died from TTP.   REVIEW OF SYSTEMS:  Notable for no recent fevers, chills.  The patient does  note a slight headache two days ago.  Denies neck pain, has slight shortness  of breath, denies chest pain, denies any problems controlling the bowels or  the bladder, denies any abdominal pain, numbness on the body.  The patient  has not had any blackout episodes or dizziness.   PHYSICAL EXAMINATION:  VITAL SIGNS:  Blood pressure is 132/93, heart rate is  77, respiratory rate 20, temperature afebrile.  GENERAL:  This patient is a markedly obese, black female, who is alert and  cooperative at the time of the examination.  HEENT:  Head is atraumatic.  Eyes:  Pupils are equal, round, and reactive to  light.  Disks are flat bilaterally.  NECK:  Supple, no carotid bruits noted.  RESPIRATORY  EXAMINATION:  Clear.  CARDIOVASCULAR EXAMINATION:  Regular rate and rhythm, no obvious murmurs or  rubs noted.  EXTREMITIES:  Without significant edema.  ABDOMEN:  Obese, nontender, positive bowel sounds.  NEUROLOGIC EXAMINATION:  Cranial nerves as above.  Facial symmetry is  present.  The patient has good sensation of the face to pinprick and soft  touch bilaterally, has good strength of the facial muscles, muscles of the  head, turning and shoulder shrug bilaterally.  Speech is well-enunciated and  not aphasic.  Again, extraocular movements are full.  Motor testing appears  to show good strength on all fours, including the left lower extremity.  The  patient notes decreased pinprick sensation in the entirety of the left leg  as compared to the right.  Vibratory sensation is depressed on the leg as  compared to the right.  Pinprick, soft touch, and vibratory sensation is  symmetric in the arms.  The patient has good finger-to-nose-to-finger and  toe-to-finger bilaterally.  No drift is seen in the upper extremities.  Deep  tendon reflexes are present but symmetric.  Toes are neutral bilaterally.  The patient does note pain with elevation of the leg and with internal and  external rotation of the hip.  Much of this patient's complaints of pain is  around the left hip.   IMPRESSION:  1.  History of multiple sclerosis.  2.  Back pain and left leg pain.  3.  Status post fall with worsening pain in the left leg, and numbness, both      legs, left greater than right.   This patient really has minimal findings objectively on examination but does  need to be considered for workup for a possible herniated lumbosacral disk.  The patient claims that she is no longer ambulatory.  The patient claims  numbness in the entirety of the left leg, had numbness on both legs  initially.  I suppose a multiple sclerosis attack does need to be  considered.  The patient's multiple sclerosis has been quite  active  recently.  PLAN:  1.  Admission to [facility].  2.  X-ray of the left hip.  3.  MRI scan of the lumbosacral spine.  4.  Admission blood work.  5.  IV steroids and analgesics.   I will follow the patient closely, of course, while in house.      Marlan Palau, M.D.  Electronically Signed     CKW/MEDQ  D:  04/28/2006  T:  04/28/2006  Job:  [id number]   cc:   Marcene Duos, M.D.  [facility]

## 2011-02-16 NOTE — Consult Note (Signed)
NAMEBRYNNLY, BONET             ACCOUNT NO.:  [id number]   MEDICAL RECORD NO.:  [id number]          PATIENT TYPE:  INP   LOCATION:  5015                         FACILITY:  [facility]   PHYSICIAN:  Lemmie Evens, M.D.DATE OF BIRTH:  [birth year]   DATE OF CONSULTATION:  02/13/2005  DATE OF DISCHARGE:  02/13/2005                                   CONSULTATION   [name] is a 40 year old African-American female who works in the  laboratory at [employer] who has been hospitalized for an evaluation  of bilateral ankle and leg pain.  In addition, it was felt she could have a  flare-up of her multiple sclerosis.   The patient has had a diagnosis of multiple sclerosis since January of this  year.  She did have an abnormal brain MRI scan suggesting demyelinating  process.  Initially, she did have marked difficulty walking with frequent  muscle discomfort as well as spinal pain.  She has had optic neuritis with  blurriness of vision.  Over the last few months she has required  intermittent infusion of high dose Medrol as well as short courses of oral  prednisone.   Approximately a month ago the patient developed discomfort in the hips,  legs, and even ankle region.  She has not had pain or swelling in the upper  extremity.  The pain in the ankle area became so severe that she found it  difficult to walk without assistance over the course of the past week.   The patient has noticed swelling in the ankle joint region.  She has not had  definite skin rash, mouth sores, chest or abdominal complaints, or evidence  of Raynaud's phenomenon.  She denies numbness in the lower extremity,  although she has had some numbness in the hands and arms.   The patient does have a history of GERD.  She does take medication for that.  Also, she has been using injectable Interferon weekly.  She has had no prior  history of reaction to that.   The patient does have a 60-month-old daughter.   Apparently the daughter has  been in good health.  Patient does have a sister with lupus.   The patient has not had fevers, sweats, chills, cough, severe sore throat,  or diarrhea.   In the initial phase of the hospitalization it was noted that her hemoglobin  was 12.8, white cell count 16,200, platelets 353,000, sedimentation rate 5,  creatinine 1, CRP 29, uric acid 5.  Quantitative immunoglobulins for IgM,  IgG, and IgA were within normal limits.  Urinalysis was normal.  Rheumatoid  factor was negative.  Drug screen was negative.  Chest x-ray appeared  normal.  X-ray of both ankles showed bony fragments over the medial and  lateral malleolar bone in the left ankle and in the medial aspect of the  right ankle.   Patient was told that her ANA was negative in January.  Also, she did  undergo a bone scan during the hospitalization.  There is a note in the  chart that it was negative.   Patient did  receive a two-day course of Solu-Medrol 500 mg given  intravenously.   Patient is feeling somewhat better at this time with less pain and swelling  in the lower extremity joints.   PHYSICAL EXAMINATION:  GENERAL:  The patient appears to be healthy and well-  nourished.  EXTREMITIES:  Good hand grip bilaterally.  There is no swelling of the MCP  or PIP joint.  Wrist, elbow, and shoulder examination appear normal.  Knee,  ankle, and foot examination reveal good mobility without joint swelling.  She does have good motion of both ankle joints.  There is some tenderness on  palpation of the posterolateral aspect of the right ankle.  Hip examination  revealed good hip flexion, extension, abduction, adduction, internal, and  external rotation.  Muscle strength testing revealed 5/5 strength in the  biceps, triceps, deltoid, and lower extremity muscle groups.  Skin  examination revealed no definite rash on the trunk, face, or extremity.   ASSESSMENT:  The patient has had a history of pain in both  ankle joints with  some swelling.  This could be a reactive arthritis which has improved  significantly with parenteral Medrol.  Certainly, one would have to rule out  a drug side effect or even exposure to a recent infection such as a viral  infection.  I did order Parvovirus, B19, IgG, and IgM antibody, __________  therapy, and CPK.  The patient would benefit by continued physical therapy  to help with lower extremity muscle strengthening.  It is very doubtful that  the patient has lupus with reported negative ANA about three months ago.  Also, she does not have the clinical features for rheumatoid arthritis in  that she has no involvement of the upper extremity joints.  Sarcoidosis is  less likely with a normal chest x-ray.   The patient may continue to use Voltaren 50 mg once or twice a day for  control of joint discomfort.      JJZ/MEDQ  D:  02/13/2005  T:  02/13/2005  Job:  [id number]

## 2011-03-12 ENCOUNTER — Emergency Department (HOSPITAL_BASED_OUTPATIENT_CLINIC_OR_DEPARTMENT_OTHER)
Admission: EM | Admit: 2011-03-12 | Discharge: 2011-03-12 | Disposition: A | Discharge status: Home / Self Care | Payer: Medicare PPO | Attending: Emergency Medicine | Admitting: Emergency Medicine

## 2011-03-12 DIAGNOSIS — Z79899 Other long term (current) drug therapy: Secondary | ICD-10-CM | POA: Insufficient documentation

## 2011-03-12 DIAGNOSIS — L0231 Cutaneous abscess of buttock: Secondary | ICD-10-CM | POA: Insufficient documentation

## 2011-03-14 ENCOUNTER — Emergency Department (HOSPITAL_BASED_OUTPATIENT_CLINIC_OR_DEPARTMENT_OTHER)
Admission: EM | Admit: 2011-03-14 | Discharge: 2011-03-14 | Disposition: A | Discharge status: Home / Self Care | Payer: Medicare PPO | Attending: Emergency Medicine | Admitting: Emergency Medicine

## 2011-03-14 DIAGNOSIS — L02419 Cutaneous abscess of limb, unspecified: Secondary | ICD-10-CM | POA: Insufficient documentation

## 2011-03-14 DIAGNOSIS — IMO0001 Reserved for inherently not codable concepts without codable children: Secondary | ICD-10-CM | POA: Insufficient documentation

## 2011-03-14 DIAGNOSIS — G35 Multiple sclerosis: Secondary | ICD-10-CM | POA: Insufficient documentation

## 2011-03-14 DIAGNOSIS — L03119 Cellulitis of unspecified part of limb: Secondary | ICD-10-CM | POA: Insufficient documentation

## 2011-03-14 LAB — GLUCOSE, CAPILLARY: Glucose-Capillary: 104 mg/dL — ABNORMAL HIGH (ref 70–99)

## 2011-03-18 LAB — CULTURE, ROUTINE-ABSCESS: Gram Stain: NONE SEEN

## 2011-06-07 ENCOUNTER — Other Ambulatory Visit: Payer: Self-pay | Admitting: Neurology

## 2011-06-07 DIAGNOSIS — M5410 Radiculopathy, site unspecified: Secondary | ICD-10-CM

## 2011-06-07 DIAGNOSIS — G5710 Meralgia paresthetica, unspecified lower limb: Secondary | ICD-10-CM

## 2011-06-07 DIAGNOSIS — R269 Unspecified abnormalities of gait and mobility: Secondary | ICD-10-CM

## 2011-06-07 DIAGNOSIS — G35 Multiple sclerosis: Secondary | ICD-10-CM

## 2011-06-22 LAB — SAMPLE TO BLOOD BANK

## 2011-06-22 LAB — CBC
HCT: 32.5 — ABNORMAL LOW
Hemoglobin: 10.6 — ABNORMAL LOW
MCHC: 32.7
RBC: 4.18
RDW: 16 — ABNORMAL HIGH

## 2011-06-22 LAB — WET PREP, GENITAL: Clue Cells Wet Prep HPF POC: NONE SEEN

## 2011-06-22 LAB — GC/CHLAMYDIA PROBE AMP, GENITAL: GC Probe Amp, Genital: NEGATIVE

## 2011-06-25 ENCOUNTER — Ambulatory Visit
Admission: RE | Admit: 2011-06-25 | Discharge: 2011-06-25 | Disposition: A | Discharge status: Home / Self Care | Payer: Medicare PPO | Source: Ambulatory Visit | Attending: Neurology | Admitting: Neurology

## 2011-06-25 DIAGNOSIS — G35 Multiple sclerosis: Secondary | ICD-10-CM

## 2011-06-25 DIAGNOSIS — G5710 Meralgia paresthetica, unspecified lower limb: Secondary | ICD-10-CM

## 2011-06-25 DIAGNOSIS — R269 Unspecified abnormalities of gait and mobility: Secondary | ICD-10-CM

## 2011-06-25 DIAGNOSIS — M5410 Radiculopathy, site unspecified: Secondary | ICD-10-CM

## 2011-06-25 MED ORDER — GADOBENATE DIMEGLUMINE 529 MG/ML IV SOLN
20.0000 mL | Freq: Once | INTRAVENOUS | Status: AC | PRN
  Administered 2011-06-25: 20 mL via INTRAVENOUS

## 2011-06-26 LAB — POCT PREGNANCY, URINE
Operator id: 297281
Preg Test, Ur: NEGATIVE

## 2011-07-04 LAB — MISCELLANEOUS TEST

## 2011-07-04 LAB — CBC
HCT: 32.1 — ABNORMAL LOW
Hemoglobin: 10.2 — ABNORMAL LOW
MCHC: 31.8
Platelets: 328
RDW: 16.9 — ABNORMAL HIGH

## 2011-07-04 LAB — PROTIME-INR
INR: 1.1
Prothrombin Time: 14

## 2011-07-04 LAB — COMPREHENSIVE METABOLIC PANEL
Albumin: 3.3 — ABNORMAL LOW
BUN: 9
Calcium: 8.8
Glucose, Bld: 81
Potassium: 3.6
Sodium: 139
Total Protein: 7

## 2011-07-04 LAB — APTT: aPTT: 27

## 2011-11-15 ENCOUNTER — Ambulatory Visit
Admission: RE | Admit: 2011-11-15 | Discharge: 2011-11-15 | Disposition: A | Discharge status: Home / Self Care | Payer: Medicare PPO | Source: Ambulatory Visit | Attending: Neurology | Admitting: Neurology

## 2011-11-15 ENCOUNTER — Other Ambulatory Visit: Payer: Self-pay | Admitting: Neurology

## 2011-11-15 DIAGNOSIS — R52 Pain, unspecified: Secondary | ICD-10-CM

## 2012-07-22 ENCOUNTER — Emergency Department (HOSPITAL_BASED_OUTPATIENT_CLINIC_OR_DEPARTMENT_OTHER)
Admission: EM | Admit: 2012-07-22 | Discharge: 2012-07-22 | Disposition: A | Discharge status: Home / Self Care | Payer: Medicare PPO | Attending: Emergency Medicine | Admitting: Emergency Medicine

## 2012-07-22 ENCOUNTER — Encounter (HOSPITAL_BASED_OUTPATIENT_CLINIC_OR_DEPARTMENT_OTHER): Payer: Self-pay

## 2012-07-22 DIAGNOSIS — Z79899 Other long term (current) drug therapy: Secondary | ICD-10-CM | POA: Insufficient documentation

## 2012-07-22 DIAGNOSIS — J329 Chronic sinusitis, unspecified: Secondary | ICD-10-CM | POA: Insufficient documentation

## 2012-07-22 DIAGNOSIS — G35 Multiple sclerosis: Secondary | ICD-10-CM | POA: Insufficient documentation

## 2012-07-22 DIAGNOSIS — E079 Disorder of thyroid, unspecified: Secondary | ICD-10-CM | POA: Insufficient documentation

## 2012-07-22 DIAGNOSIS — E119 Type 2 diabetes mellitus without complications: Secondary | ICD-10-CM | POA: Insufficient documentation

## 2012-07-22 DIAGNOSIS — G4730 Sleep apnea, unspecified: Secondary | ICD-10-CM | POA: Insufficient documentation

## 2012-07-22 DIAGNOSIS — Z791 Long term (current) use of non-steroidal anti-inflammatories (NSAID): Secondary | ICD-10-CM | POA: Insufficient documentation

## 2012-07-22 DIAGNOSIS — R51 Headache: Secondary | ICD-10-CM

## 2012-07-22 HISTORY — DX: Type 2 diabetes mellitus without complications: E11.9

## 2012-07-22 HISTORY — DX: Sleep apnea, unspecified: G47.30

## 2012-07-22 HISTORY — DX: Disorder of thyroid, unspecified: E07.9

## 2012-07-22 HISTORY — DX: Fibromyalgia: M79.7

## 2012-07-22 HISTORY — DX: Multiple sclerosis: G35

## 2012-07-22 MED ORDER — FLUCONAZOLE 150 MG PO TABS: 150.0000 mg | ORAL_TABLET | Freq: Once | ORAL | Status: AC

## 2012-07-22 MED ORDER — SODIUM CHLORIDE 0.9 % IV BOLUS (SEPSIS)
1000.0000 mL | Freq: Once | INTRAVENOUS | Status: AC
  Administered 2012-07-22: 1000 mL via INTRAVENOUS

## 2012-07-22 MED ORDER — MORPHINE SULFATE 4 MG/ML IJ SOLN
4.0000 mg | Freq: Once | INTRAMUSCULAR | Status: AC
  Administered 2012-07-22: 4 mg via INTRAVENOUS
  Filled 2012-07-22: qty 1

## 2012-07-22 MED ORDER — AMOXICILLIN 500 MG PO CAPS: 500.0000 mg | ORAL_CAPSULE | Freq: Three times a day (TID) | ORAL | Status: DC

## 2012-07-22 MED ORDER — ONDANSETRON HCL 4 MG/2ML IJ SOLN
4.0000 mg | Freq: Once | INTRAMUSCULAR | Status: AC
  Administered 2012-07-22: 4 mg via INTRAVENOUS
  Filled 2012-07-22: qty 2

## 2012-07-22 NOTE — ED Notes (Signed)
Pt reports a headache x 1 week. 

## 2012-07-22 NOTE — ED Provider Notes (Signed)
History     CSN: [id number]  Arrival date & time 07/22/12  1805   First MD Initiated Contact with Patient 07/22/12 1836      Chief Complaint  Patient presents with  . Headache     HPI Patient presents to the emergency room with complaints of left-sided headache. The headache has been ongoing for the last week progressively getting worse. She has been having sinus congestion and pressure on the left side of her face. She has been trying antihistamines and decongestants as well as Flonase nasal spray without relief. She has not had any fevers or neck pain. She has had some nausea and vomiting that she attributes to the postnasal drip. She says that she gets this about once a year  She does have a history of multiple sclerosis and recently started a steroid taper for an exacerbation associated with speech deficits and extremity weakness. She states usually she does not having trouble with headaches associated with her MS Past Medical History  Diagnosis Date  . MS (multiple sclerosis)   . Thyroid disease   . Fibromyalgia   . Sleep apnea   . Diabetes mellitus without complication     Past Surgical History  Procedure Date  . Hernia repair     No family history on file.  History  Substance Use Topics  . Smoking status: Never Smoker   . Smokeless tobacco: Not on file  . Alcohol Use: No    OB History    Grav Para Term Preterm Abortions TAB SAB Ect Mult Living                  Review of Systems  All other systems reviewed and are negative.    Allergies  Review of patient's allergies indicates no known allergies.  Home Medications   Current Outpatient Rx  Name Route Sig Dispense Refill  . BACLOFEN 10 MG PO TABS Oral Take 20 mg by mouth 2 (two) times daily.    Marland Kitchen CETIRIZINE HCL 10 MG PO TABS Oral Take 10 mg by mouth daily.    Marland Kitchen FINGOLIMOD HCL 0.5 MG PO CAPS Oral Take by mouth.    . FLUOXETINE HCL 20 MG PO CAPS Oral Take 20 mg by mouth daily.    Marland Kitchen FLUTICASONE  PROPIONATE 50 MCG/ACT NA SUSP Nasal Place 2 sprays into the nose daily.    Marland Kitchen LEVOTHYROXINE SODIUM 75 MCG PO TABS Oral Take 75 mcg by mouth daily.    . MELOXICAM 7.5 MG PO TABS Oral Take 7.5 mg by mouth daily.    Marland Kitchen METFORMIN HCL 500 MG PO TABS Oral Take 500 mg by mouth 2 (two) times daily with a meal.    . MODAFINIL 200 MG PO TABS Oral Take 200 mg by mouth daily.    . MORPHINE SULFATE ER 30 MG PO CP24 Oral Take 30 mg by mouth every morning.    Marland Kitchen MORPHINE SULFATE ER 50 MG PO CP24 Oral Take 50 mg by mouth at bedtime as needed.    Marland Kitchen PERCOCET PO Oral Take 20 mg by mouth daily.    Marland Kitchen PREGABALIN 75 MG PO CAPS Oral Take 75 mg by mouth 2 (two) times daily.      BP 147/77  Pulse 98  Temp 98.1 F (36.7 C) (Oral)  Resp 18  Ht 5\' 4"  (1.626 m)  Wt 230 lb (104.327 kg)  BMI 39.48 kg/m2  SpO2 99%  Physical Exam  Nursing note and vitals reviewed. Constitutional: She  appears well-developed and well-nourished. No distress.  HENT:  Head: Normocephalic and atraumatic.  Right Ear: External ear normal.  Left Ear: External ear normal.       Left maxillary sinus tenderness to palpation  Eyes: Conjunctivae normal are normal. Right eye exhibits no discharge. Left eye exhibits no discharge. No scleral icterus.  Neck: Neck supple. No tracheal deviation present.  Cardiovascular: Normal rate, regular rhythm and intact distal pulses.   Pulmonary/Chest: Effort normal and breath sounds normal. No stridor. No respiratory distress. She has no wheezes. She has no rales.  Abdominal: Soft. Bowel sounds are normal. She exhibits no distension. There is no tenderness. There is no rebound and no guarding.  Musculoskeletal: She exhibits no edema and no tenderness.  Neurological: She is alert. She has normal strength. She displays tremor. No cranial nerve deficit ( no gross defecits noted) or sensory deficit. She exhibits normal muscle tone. She displays no seizure activity.       Slurred speech  Skin: Skin is warm and dry.  No rash noted.  Psychiatric: She has a normal mood and affect.    ED Course  Procedures (including critical care time)  Medications  Fingolimod HCl (GILENYA) 0.5 MG CAPS (not administered)  FLUoxetine (PROZAC) 20 MG capsule (not administered)  modafinil (PROVIGIL) 200 MG tablet (not administered)  baclofen (LIORESAL) 10 MG tablet (not administered)  pregabalin (LYRICA) 75 MG capsule (not administered)  metFORMIN (GLUCOPHAGE) 500 MG tablet (not administered)  levothyroxine (SYNTHROID, LEVOTHROID) 75 MCG tablet (not administered)  Oxycodone-Acetaminophen (PERCOCET PO) (not administered)  morphine (KADIAN) 30 MG 24 hr capsule (not administered)  morphine (KADIAN) 50 MG 24 hr capsule (not administered)  meloxicam (MOBIC) 7.5 MG tablet (not administered)  cetirizine (ZYRTEC) 10 MG tablet (not administered)  fluticasone (FLONASE) 50 MCG/ACT nasal spray (not administered)  morphine 4 MG/ML injection 4 mg (4 mg Intravenous Given 07/22/12 1913)  ondansetron (ZOFRAN) injection 4 mg (4 mg Intravenous Given 07/22/12 1909)  sodium chloride 0.9 % bolus 1,000 mL (1000 mL Intravenous New Bag/Given 07/22/12 1905)    Labs Reviewed - No data to display No results found.    MDM  The patient is feeling much better at this point after treatment. Her symptoms are suggestive of a sinus infection. I will give her a prescription for antibiotics and have her followup with her primary Dr.  At this time there does not appear to be any evidence of an acute emergency medical condition and the patient appears stable for discharge with appropriate outpatient follow up.         Celene Kras, MD 07/22/12 2029

## 2012-10-23 DIAGNOSIS — E559 Vitamin D deficiency, unspecified: Secondary | ICD-10-CM | POA: Insufficient documentation

## 2012-10-23 DIAGNOSIS — G250 Essential tremor: Secondary | ICD-10-CM | POA: Insufficient documentation

## 2012-10-23 DIAGNOSIS — R269 Unspecified abnormalities of gait and mobility: Secondary | ICD-10-CM | POA: Insufficient documentation

## 2012-10-23 DIAGNOSIS — E349 Endocrine disorder, unspecified: Secondary | ICD-10-CM | POA: Insufficient documentation

## 2012-10-23 DIAGNOSIS — G5710 Meralgia paresthetica, unspecified lower limb: Secondary | ICD-10-CM | POA: Insufficient documentation

## 2012-10-23 DIAGNOSIS — IMO0002 Reserved for concepts with insufficient information to code with codable children: Secondary | ICD-10-CM | POA: Insufficient documentation

## 2012-10-23 DIAGNOSIS — F411 Generalized anxiety disorder: Secondary | ICD-10-CM | POA: Insufficient documentation

## 2012-10-23 DIAGNOSIS — E041 Nontoxic single thyroid nodule: Secondary | ICD-10-CM | POA: Insufficient documentation

## 2012-10-23 DIAGNOSIS — R251 Tremor, unspecified: Secondary | ICD-10-CM | POA: Insufficient documentation

## 2012-10-23 DIAGNOSIS — R7309 Other abnormal glucose: Secondary | ICD-10-CM | POA: Insufficient documentation

## 2012-10-23 DIAGNOSIS — G35 Multiple sclerosis: Secondary | ICD-10-CM | POA: Insufficient documentation

## 2013-01-19 ENCOUNTER — Other Ambulatory Visit: Payer: Self-pay

## 2013-01-19 MED ORDER — MORPHINE SULFATE ER 30 MG PO CP24: 30.0000 mg | ORAL_CAPSULE | Freq: Every morning | ORAL | Status: DC

## 2013-01-19 MED ORDER — MORPHINE SULFATE ER 50 MG PO CP24: 50.0000 mg | ORAL_CAPSULE | Freq: Every evening | ORAL | Status: DC

## 2013-01-19 MED ORDER — OXYCODONE-ACETAMINOPHEN 5-325 MG PO TABS: 1.0000 | ORAL_TABLET | Freq: Two times a day (BID) | ORAL | Status: DC

## 2013-01-19 NOTE — Telephone Encounter (Signed)
[facility] patient assigned to Dr [name].  Requesting refills on all 3 pain meds.  She woulld like them mailed to her when ready.

## 2013-02-10 ENCOUNTER — Other Ambulatory Visit: Payer: Self-pay

## 2013-02-10 MED ORDER — OXYCODONE-ACETAMINOPHEN 5-325 MG PO TABS: 1.0000 | ORAL_TABLET | Freq: Two times a day (BID) | ORAL | Status: DC

## 2013-02-10 MED ORDER — MORPHINE SULFATE ER 50 MG PO CP24: 50.0000 mg | ORAL_CAPSULE | Freq: Every evening | ORAL | Status: DC

## 2013-02-10 MED ORDER — MORPHINE SULFATE ER 30 MG PO CP24: 30.0000 mg | ORAL_CAPSULE | Freq: Every morning | ORAL | Status: DC

## 2013-02-10 NOTE — Telephone Encounter (Signed)
[facility] patient assigned to Dr [name] calling to refill all 3 of her pain meds.

## 2013-02-19 ENCOUNTER — Encounter: Payer: Self-pay | Admitting: Neurology

## 2013-02-19 DIAGNOSIS — R7309 Other abnormal glucose: Secondary | ICD-10-CM

## 2013-02-19 DIAGNOSIS — E349 Endocrine disorder, unspecified: Secondary | ICD-10-CM

## 2013-02-19 DIAGNOSIS — E041 Nontoxic single thyroid nodule: Secondary | ICD-10-CM

## 2013-02-19 DIAGNOSIS — R269 Unspecified abnormalities of gait and mobility: Secondary | ICD-10-CM

## 2013-02-19 DIAGNOSIS — IMO0002 Reserved for concepts with insufficient information to code with codable children: Secondary | ICD-10-CM

## 2013-02-19 DIAGNOSIS — G250 Essential tremor: Secondary | ICD-10-CM

## 2013-02-19 DIAGNOSIS — F411 Generalized anxiety disorder: Secondary | ICD-10-CM

## 2013-02-19 DIAGNOSIS — G35 Multiple sclerosis: Secondary | ICD-10-CM

## 2013-02-19 DIAGNOSIS — E559 Vitamin D deficiency, unspecified: Secondary | ICD-10-CM

## 2013-02-20 ENCOUNTER — Encounter: Payer: Self-pay | Admitting: Neurology

## 2013-02-20 ENCOUNTER — Ambulatory Visit (INDEPENDENT_AMBULATORY_CARE_PROVIDER_SITE_OTHER): Payer: Medicare PPO | Admitting: Neurology

## 2013-02-20 ENCOUNTER — Other Ambulatory Visit: Payer: Self-pay

## 2013-02-20 VITALS — Ht 64.0 in | Wt 311.0 lb

## 2013-02-20 DIAGNOSIS — G35 Multiple sclerosis: Secondary | ICD-10-CM

## 2013-02-20 DIAGNOSIS — R269 Unspecified abnormalities of gait and mobility: Secondary | ICD-10-CM

## 2013-02-20 DIAGNOSIS — G35D Multiple sclerosis, unspecified: Secondary | ICD-10-CM

## 2013-02-20 MED ORDER — FINGOLIMOD HCL 0.5 MG PO CAPS: 0.5000 mg | ORAL_CAPSULE | Freq: Every day | ORAL | Status: DC

## 2013-02-20 NOTE — Progress Notes (Signed)
History of Present Illness:  [name] is a 42 year old right-handed African American single female from [place], [place], she was diagnosed with multiple sclerosis 10/2004 with positive MRI of the brain and  6 oligoclonal bands in CSF.  She was patient of Dr. Sandria George.  She was begun on Avonex. 03/12/2006 she had  new cervical medullary lesion and was changed to rebif. She is in the EPOC study with Gilenya and began the  medication 05/24/10. She is not having any side effects from the medication. She had a normal examination at the [facility] specialists clinic checking for macular edema on 08/17/10. She denies side effects from the medication.  She denies Lhermitte's sign or bowel or bladder dysfunction. She has  lower back pain and right hip pain that extends to her right upper thigh. It occurs intermittently. She has pain in the muscles of her arms and legs.     She has thyroid disease and thyroid nodules and was followed by Dr.Kerr, endocrinologist but now by Dr. Louis George who indicates that she  has diabetes mellitus. Anti thyroglobulin and thyro peroxidase antibodies have been negative. 05/29/2011 CMP and CBC were normal except for white blood cell count 3800 and hemoglobin 11.5.glucose was 106.   06/25/11 MRI of the brain and cervical spine showed a few scattered supratentorial nonspecific foci of T2 hyperintensity without enhancement and no change versus 04/19/09. There was disc bulging at C5-6 and C6-7 without cord lesions present and no change versus 06/16/08.   She complains of lower back pain which is not radiating to her legs. She is on Kadian 30 mg XR 24 one in the morning and Kadian 50 mg XR one in the evening for fibromyalgia pain and leg pain.  She is hesitant to  increase Lyrica 75 mg twice a day because of weight gain. She is on baclofen 20 mg twice a day.    She has numbness in the right anterior thigh which may be lumbar radiculopathy versus meralgia paresthetica. Her bowel and  bladder function are normal.    She is independent in her activities of daily living. She has had recurrence of pain in her back and right hip down the right leg.This is made worse by walking and decreased with sitting down.  She rates it  6/10.It is also made worse by cold air. Thursday 11/08/11 she felt better but was overtired. She noticed her right leg dragging. 11/13/11, she stood up and noted a lightheaded sensation and hot flash. She noted increased numbness in her legs  She developed foreign accent syndrome in 2012.  A course of 3 days of IV Solu-Medrol followed by po  prednisone was effective. This was her first attacks since she had begun Gilenya.   She is using CPAP At 13 cm of water with ESS 3. She had  pulmonary function tests because of decreased breath sounds  which were normal.     07/08/2012 she awoke with nausea and  shaking of her right hand and arm, and  inability to speak. Her right leg "did not want to go".  The following day her speech was hesitant  but well-formed. She was treated by iv followed by po steroid.  UPDATE May 23rd, 2014, last clinical visit is Oct 23, 2012 by Dr. Sandria George She complains of bilateral leg hot, burning sensation, right leg pain from hip to foot,  She complains of diffuse body achy pain, She is still taking Gilenya, both legs and feet hurt burning sensation, especially at  night time, on chronic morphine,  Review of Systems  Out of a complete 14 system review, the patient complains of only the following symptoms, and all other reviewed systems are negative.   Constitutional:   fatigue Cardiovascular:  N/A Ear/Nose/Throat:  N/A Skin: N/A Eyes: N/A Respiratory: N/A Gastroitestinal: N/A    Hematology/Lymphatic:  N/A Endocrine:  Feeling hot, cold Musculoskeletal: aching muscles Allergy/Immunology: allergy Neurological: weakness, insominia, tremor Psychiatric:    N/A  Neurologic Exam  Mental Status: Alert and oriented to time, place, and person.   Speaks with hesitant speech and foreign accent syndrome  Cranial Nerves: Visual fields are full to count fingers examination. Discs flat.  Extraocular movements full. Visual field is full on confrontational test.  Hearing intact.  There was no facial asymmetry.  Tongue midline, uvula midline, and gags present.   Tremor in her voice. Sternocleidomastoid and trapezius testing normal. No ocular dysmetria Motor: 5/5 strength proximally and distally in the upper and lower extremities.  No evidence of proximal, pronator, or distal drift.  No focal atrophy and no fasciculations seen.   Sensory: Intact to pinprick, light touch, joint position, and vibration sensation. Coordination:  Outstretched hand and arm tremor.  Intact finger-to-nose, heel-to-shin, and rapid alternating movements.  No evidence of rebound. Gait and Station:  Can stand on her toes. Can stand on her heels. Can get out of a chair without using her hands and arms.  She complains of mild atalgic due to right leg pain. Reflexes:  DTRs 2+ and equal.  Plantar responses downgoing.   Assessement and Plan: 42 yo with relapsing remitting multiple sclerosis, on Gilenya treatment. Also with chronic diffuse body achy pain, fibromyalgia, Diabetes mellitus, Thyroid nodule, Foreign Accent syndrome   1. She is doing well on Gilenya.  2. Moderate exercise. 3. Pain management refer.

## 2013-02-24 DIAGNOSIS — Z0289 Encounter for other administrative examinations: Secondary | ICD-10-CM

## 2013-03-03 ENCOUNTER — Telehealth: Payer: Self-pay | Admitting: Neurology

## 2013-03-03 NOTE — Telephone Encounter (Signed)
Patient called stating she is having a MS flare. Patient stated she is having tremors in her back, arms and left leg, and she was told to take Baclofen and lay down and she had tried that and it didn't work yesterday. Patient want to know if she can be seen today.

## 2013-03-03 NOTE — Telephone Encounter (Signed)
Please give her a followup appt in 1-3 days.

## 2013-03-05 ENCOUNTER — Ambulatory Visit (INDEPENDENT_AMBULATORY_CARE_PROVIDER_SITE_OTHER): Payer: Medicare PPO | Admitting: Neurology

## 2013-03-05 ENCOUNTER — Encounter: Payer: Self-pay | Admitting: Neurology

## 2013-03-05 VITALS — BP 138/87 | HR 66 | Ht 65.0 in | Wt 311.0 lb

## 2013-03-05 DIAGNOSIS — R269 Unspecified abnormalities of gait and mobility: Secondary | ICD-10-CM

## 2013-03-05 DIAGNOSIS — G35 Multiple sclerosis: Secondary | ICD-10-CM

## 2013-03-05 NOTE — Progress Notes (Signed)
History of Present Illness:  [name] is a 42 year old right-handed African American single female from [place], [place], she was diagnosed with multiple sclerosis 10/2004 with positive MRI of the brain and  6 oligoclonal bands in CSF.  She was patient of Dr. Sandria Manly.  She was begun on Avonex. 03/12/2006 she had  new cervical medullary lesion and was changed to rebif. She is in the EPOC study with Gilenya and began the  medication 05/24/10. She is not having any side effects from the medication. She had a normal examination at the [facility] specialists clinic checking for macular edema on 08/17/10. She denies side effects from the medication.  She denies Lhermitte's sign or bowel or bladder dysfunction. She has  lower back pain and right hip pain that extends to her right upper thigh. It occurs intermittently. She has pain in the muscles of her arms and legs.     She has thyroid disease and thyroid nodules and was followed by Dr.Kerr, endocrinologist but now by Dr. Louis Meckel who indicates that she  has diabetes mellitus. Anti thyroglobulin and thyro peroxidase antibodies have been negative. 05/29/2011 CMP and CBC were normal except for white blood cell count 3800 and hemoglobin 11.5.glucose was 106.   06/25/11 MRI of the brain and cervical spine showed a few scattered supratentorial nonspecific foci of T2 hyperintensity without enhancement and no change versus 04/19/09. There was disc bulging at C5-6 and C6-7 without cord lesions present and no change versus 06/16/08.   She complains of lower back pain which is not radiating to her legs. She is on Kadian 30 mg XR 24 one in the morning and Kadian 50 mg XR one in the evening for fibromyalgia pain and leg pain.  She is hesitant to  increase Lyrica 75 mg twice a day because of weight gain. She is on baclofen 20 mg twice a day.    She has numbness in the right anterior thigh which may be lumbar radiculopathy versus meralgia paresthetica. Her bowel and  bladder function are normal.    She is independent in her activities of daily living. She has had recurrence of pain in her back and right hip down the right leg.This is made worse by walking and decreased with sitting down.  She rates it  6/10.It is also made worse by cold air. Thursday 11/08/11 she felt better but was overtired. She noticed her right leg dragging. 11/13/11, she stood up and noted a lightheaded sensation and hot flash. She noted increased numbness in her legs  She developed foreign accent syndrome in 2012.  A course of 3 days of IV Solu-Medrol followed by po  prednisone was effective. This was her first attacks since she had begun Gilenya.   She is using CPAP At 13 cm of water with ESS 3. She had  pulmonary function tests because of decreased breath sounds  which were normal.     07/08/2012 she awoke with nausea and  shaking of her right hand and arm, and  inability to speak. Her right leg "did not want to go".  The following day her speech was hesitant  but well-formed. She was treated by iv followed by po steroid.  UPDATE May 23rd, 2014, last clinical visit is Oct 23, 2012 by Dr. Sandria Manly She complains of bilateral leg hot, burning sensation, right leg pain from hip to foot,  She complains of diffuse body achy pain, She is still taking Gilenya, both legs and feet hurt burning sensation, especially at  night time, on chronic morphine,  UPDATE June 5th 2014: Since her last visit in May 20 third, she had multiple phone call to our office, with constellation of complains, including increased bilateral lower extremity tremor, gait difficulty, continued language difficulty, she is taking baclofen 20 mg 3 times a day, Xanax 0. 2 5 mg twice a day per instruction of on-call physician.   Review of Systems  Out of a complete 14 system review, the patient complains of only the following symptoms, and all other reviewed systems are negative.   Constitutional:   Fatigue, obese Cardiovascular:   N/A Ear/Nose/Throat:  N/A Skin: N/A Eyes: N/A Respiratory: N/A Gastroitestinal: N/A    Hematology/Lymphatic:  N/A Endocrine:  Feeling hot, cold Musculoskeletal: aching muscles Allergy/Immunology: allergy Neurological: weakness, insominia, tremor Psychiatric:    N/A  Neurologic Exam  Mental Status: Alert and oriented to time, place, and person.  Speaks with hesitant speech and foreign accent syndrome  Cranial Nerves: Visual fields are full to count fingers examination. Extraocular movements full. Visual field is full on confrontational test.  Hearing intact.  There was no facial asymmetry.  Tongue midline, uvula midline, and gags present.   Tremor in her voice. Sternocleidomastoid and trapezius testing normal. No ocular dysmetria Motor: 5/5 strength proximally and distally in the upper and lower extremities.  No evidence of proximal, pronator, or distal drift.  No focal atrophy and no fasciculations seen.   Sensory: Intact to pinprick, light touch, joint position, and vibration sensation. Coordination:  Outstretched hand and arm tremor.  Intact finger-to-nose, heel-to-shin, and rapid alternating movements.  No evidence of rebound. Gait and Station:  she get up from chair by pushing on chair arm, relies on her cane, cautious, Deep tendon reflex, hypoactive and symmetric   Assessement and Plan: 41 yo with relapsing remitting multiple sclerosis, on Gilenya treatment. Also with chronic diffuse body achy pain, fibromyalgia, Diabetes mellitus, Thyroid nodule, Foreign Accent syndrome   1. Keep Gilenya.  2. Repeat MRI brain w/wo 3. Lab today 4. Will hold off steroid treatment for possible MS because of her comorbidity. Wait MRI result

## 2013-03-06 LAB — COMPREHENSIVE METABOLIC PANEL
AST: 26 IU/L (ref 0–40)
Albumin: 4.1 g/dL (ref 3.5–5.5)
Alkaline Phosphatase: 110 IU/L (ref 39–117)
BUN/Creatinine Ratio: 15 (ref 9–23)
BUN: 12 mg/dL (ref 6–24)
CO2: 25 mmol/L (ref 19–28)
Calcium: 8.9 mg/dL (ref 8.7–10.2)
Chloride: 102 mmol/L (ref 97–108)

## 2013-03-06 LAB — CBC
MCV: 81 fL (ref 79–97)
Platelets: 252 10*3/uL (ref 155–379)
RBC: 4.43 x10E6/uL (ref 3.77–5.28)
WBC: 4.2 10*3/uL (ref 3.4–10.8)

## 2013-03-09 NOTE — Progress Notes (Signed)
Quick Note:  Called patient and told her normal labs. ______

## 2013-03-10 ENCOUNTER — Other Ambulatory Visit: Payer: Self-pay | Admitting: Neurology

## 2013-03-10 MED ORDER — MORPHINE SULFATE ER 30 MG PO CP24: 30.0000 mg | ORAL_CAPSULE | Freq: Every morning | ORAL | Status: DC

## 2013-03-10 MED ORDER — OXYCODONE-ACETAMINOPHEN 5-325 MG PO TABS: 1.0000 | ORAL_TABLET | Freq: Two times a day (BID) | ORAL | Status: DC

## 2013-03-10 MED ORDER — MORPHINE SULFATE ER 50 MG PO CP24: 50.0000 mg | ORAL_CAPSULE | Freq: Every evening | ORAL | Status: DC

## 2013-03-10 NOTE — Telephone Encounter (Signed)
I refilled her pain meds, please check on her pain management refer, further refill to be done by pain management.

## 2013-03-10 NOTE — Telephone Encounter (Signed)
Called patient and spoke to her she wants to know if Dr.Yan is doing A1C lab.

## 2013-03-10 NOTE — Telephone Encounter (Signed)
Patient called requesting refills on all 3 pain meds.  She would like them mailed to her when ready.

## 2013-03-10 NOTE — Telephone Encounter (Signed)
Pt states she was advised Dr. [name] req something different than what she saw on her results she wants to see about her pre-diabetic if she is and did they do the wrong test. Please call pt about this to confirm if the correct test was given per lab work.

## 2013-03-13 ENCOUNTER — Ambulatory Visit
Admission: RE | Admit: 2013-03-13 | Discharge: 2013-03-13 | Disposition: A | Discharge status: Home / Self Care | Payer: Medicare PPO | Source: Ambulatory Visit | Attending: Neurology | Admitting: Neurology

## 2013-03-13 DIAGNOSIS — G35 Multiple sclerosis: Secondary | ICD-10-CM

## 2013-03-13 DIAGNOSIS — R269 Unspecified abnormalities of gait and mobility: Secondary | ICD-10-CM

## 2013-03-13 MED ORDER — GADOBENATE DIMEGLUMINE 529 MG/ML IV SOLN
20.0000 mL | Freq: Once | INTRAVENOUS | Status: AC | PRN
  Administered 2013-03-13: 20 mL via INTRAVENOUS

## 2013-03-15 ENCOUNTER — Encounter (HOSPITAL_COMMUNITY): Payer: Self-pay | Admitting: *Deleted

## 2013-03-15 ENCOUNTER — Inpatient Hospital Stay (HOSPITAL_COMMUNITY)
Admission: EM | Admit: 2013-03-15 | Discharge: 2013-03-18 | DRG: 059 | Disposition: A | Discharge status: Home / Self Care | Payer: Medicare PPO | Attending: Family Medicine | Admitting: Family Medicine

## 2013-03-15 DIAGNOSIS — Z87891 Personal history of nicotine dependence: Secondary | ICD-10-CM

## 2013-03-15 DIAGNOSIS — K5900 Constipation, unspecified: Secondary | ICD-10-CM | POA: Diagnosis present

## 2013-03-15 DIAGNOSIS — R2681 Unsteadiness on feet: Secondary | ICD-10-CM

## 2013-03-15 DIAGNOSIS — F411 Generalized anxiety disorder: Secondary | ICD-10-CM | POA: Diagnosis present

## 2013-03-15 DIAGNOSIS — F329 Major depressive disorder, single episode, unspecified: Secondary | ICD-10-CM | POA: Diagnosis present

## 2013-03-15 DIAGNOSIS — G252 Other specified forms of tremor: Secondary | ICD-10-CM

## 2013-03-15 DIAGNOSIS — I10 Essential (primary) hypertension: Secondary | ICD-10-CM | POA: Diagnosis present

## 2013-03-15 DIAGNOSIS — F3289 Other specified depressive episodes: Secondary | ICD-10-CM | POA: Diagnosis present

## 2013-03-15 DIAGNOSIS — IMO0001 Reserved for inherently not codable concepts without codable children: Secondary | ICD-10-CM | POA: Diagnosis present

## 2013-03-15 DIAGNOSIS — E039 Hypothyroidism, unspecified: Secondary | ICD-10-CM | POA: Diagnosis present

## 2013-03-15 DIAGNOSIS — G35 Multiple sclerosis: Principal | ICD-10-CM | POA: Diagnosis present

## 2013-03-15 DIAGNOSIS — G8929 Other chronic pain: Secondary | ICD-10-CM | POA: Diagnosis present

## 2013-03-15 DIAGNOSIS — T380X5A Adverse effect of glucocorticoids and synthetic analogues, initial encounter: Secondary | ICD-10-CM | POA: Diagnosis present

## 2013-03-15 DIAGNOSIS — R269 Unspecified abnormalities of gait and mobility: Secondary | ICD-10-CM

## 2013-03-15 DIAGNOSIS — E119 Type 2 diabetes mellitus without complications: Secondary | ICD-10-CM | POA: Diagnosis present

## 2013-03-15 DIAGNOSIS — Z79899 Other long term (current) drug therapy: Secondary | ICD-10-CM

## 2013-03-15 DIAGNOSIS — Z6841 Body Mass Index (BMI) 40.0 and over, adult: Secondary | ICD-10-CM

## 2013-03-15 DIAGNOSIS — G4730 Sleep apnea, unspecified: Secondary | ICD-10-CM | POA: Diagnosis present

## 2013-03-15 DIAGNOSIS — E559 Vitamin D deficiency, unspecified: Secondary | ICD-10-CM | POA: Diagnosis present

## 2013-03-15 DIAGNOSIS — G5710 Meralgia paresthetica, unspecified lower limb: Secondary | ICD-10-CM | POA: Diagnosis present

## 2013-03-15 LAB — CBC WITH DIFFERENTIAL/PLATELET
HCT: 36.6 % (ref 36.0–46.0)
Hemoglobin: 12.1 g/dL (ref 12.0–15.0)
Lymphocytes Relative: 5 % — ABNORMAL LOW (ref 12–46)
Monocytes Absolute: 0.3 10*3/uL (ref 0.1–1.0)
Monocytes Relative: 3 % (ref 3–12)
Neutro Abs: 9.9 10*3/uL — ABNORMAL HIGH (ref 1.7–7.7)
WBC: 10.7 10*3/uL — ABNORMAL HIGH (ref 4.0–10.5)

## 2013-03-15 LAB — COMPREHENSIVE METABOLIC PANEL
ALT: 12 U/L (ref 0–35)
Albumin: 3.8 g/dL (ref 3.5–5.2)
Alkaline Phosphatase: 100 U/L (ref 39–117)
Chloride: 101 mEq/L (ref 96–112)
Glucose, Bld: 134 mg/dL — ABNORMAL HIGH (ref 70–99)
Potassium: 4.1 mEq/L (ref 3.5–5.1)
Sodium: 137 mEq/L (ref 135–145)
Total Bilirubin: 0.3 mg/dL (ref 0.3–1.2)
Total Protein: 7.3 g/dL (ref 6.0–8.3)

## 2013-03-15 MED ORDER — ONDANSETRON HCL 4 MG PO TABS: 4.0000 mg | ORAL_TABLET | Freq: Four times a day (QID) | ORAL | Status: DC | PRN

## 2013-03-15 MED ORDER — FLUTICASONE PROPIONATE 50 MCG/ACT NA SUSP
2.0000 | Freq: Every day | NASAL | Status: DC
  Administered 2013-03-16 – 2013-03-18 (×3): 2 via NASAL
  Filled 2013-03-15: qty 16

## 2013-03-15 MED ORDER — ALPRAZOLAM 0.25 MG PO TABS: 0.2500 mg | ORAL_TABLET | Freq: Every day | ORAL | Status: DC

## 2013-03-15 MED ORDER — INSULIN ASPART 100 UNIT/ML ~~LOC~~ SOLN: 0.0000 [IU] | Freq: Every day | SUBCUTANEOUS | Status: DC

## 2013-03-15 MED ORDER — HEPARIN SODIUM (PORCINE) 5000 UNIT/ML IJ SOLN
5000.0000 [IU] | Freq: Three times a day (TID) | INTRAMUSCULAR | Status: DC
  Administered 2013-03-15 – 2013-03-18 (×8): 5000 [IU] via SUBCUTANEOUS
  Filled 2013-03-15 (×11): qty 1

## 2013-03-15 MED ORDER — ALPRAZOLAM 0.25 MG PO TABS
0.2500 mg | ORAL_TABLET | Freq: Two times a day (BID) | ORAL | Status: DC
  Administered 2013-03-15 – 2013-03-18 (×6): 0.25 mg via ORAL
  Filled 2013-03-15 (×6): qty 1

## 2013-03-15 MED ORDER — SODIUM CHLORIDE 0.9 % IJ SOLN: 3.0000 mL | INTRAMUSCULAR | Status: DC | PRN

## 2013-03-15 MED ORDER — MELOXICAM 7.5 MG PO TABS
7.5000 mg | ORAL_TABLET | Freq: Every day | ORAL | Status: DC
  Administered 2013-03-15 – 2013-03-17 (×3): 7.5 mg via ORAL
  Filled 2013-03-15 (×4): qty 1

## 2013-03-15 MED ORDER — MODAFINIL 200 MG PO TABS
200.0000 mg | ORAL_TABLET | Freq: Every day | ORAL | Status: DC
  Administered 2013-03-16 – 2013-03-18 (×3): 200 mg via ORAL
  Filled 2013-03-15 (×3): qty 1

## 2013-03-15 MED ORDER — BACLOFEN 20 MG PO TABS
20.0000 mg | ORAL_TABLET | Freq: Three times a day (TID) | ORAL | Status: DC
  Administered 2013-03-15 – 2013-03-18 (×8): 20 mg via ORAL
  Filled 2013-03-15 (×10): qty 1

## 2013-03-15 MED ORDER — PREGABALIN 75 MG PO CAPS
75.0000 mg | ORAL_CAPSULE | Freq: Two times a day (BID) | ORAL | Status: DC
  Administered 2013-03-15 – 2013-03-18 (×6): 75 mg via ORAL
  Filled 2013-03-15 (×6): qty 1

## 2013-03-15 MED ORDER — MAGNESIUM OXIDE -MG SUPPLEMENT 400 (240 MG) MG PO TABS: 40.0000 mg | ORAL_TABLET | Freq: Every day | ORAL | Status: DC

## 2013-03-15 MED ORDER — SODIUM CHLORIDE 0.9 % IV SOLN: 250.0000 mL | INTRAVENOUS | Status: DC | PRN

## 2013-03-15 MED ORDER — FLUOXETINE HCL 20 MG PO CAPS
20.0000 mg | ORAL_CAPSULE | Freq: Every day | ORAL | Status: DC
  Administered 2013-03-16 – 2013-03-18 (×3): 20 mg via ORAL
  Filled 2013-03-15 (×3): qty 1

## 2013-03-15 MED ORDER — MORPHINE SULFATE ER 15 MG PO TBCR
60.0000 mg | EXTENDED_RELEASE_TABLET | Freq: Every day | ORAL | Status: DC
  Administered 2013-03-15 – 2013-03-17 (×3): 60 mg via ORAL
  Filled 2013-03-15 (×5): qty 4

## 2013-03-15 MED ORDER — MORPHINE SULFATE ER 15 MG PO TBCR
30.0000 mg | EXTENDED_RELEASE_TABLET | Freq: Every day | ORAL | Status: DC
  Administered 2013-03-16 – 2013-03-18 (×3): 30 mg via ORAL
  Filled 2013-03-15 (×2): qty 2

## 2013-03-15 MED ORDER — MORPHINE SULFATE ER 30 MG PO CP24: 30.0000 mg | ORAL_CAPSULE | Freq: Every morning | ORAL | Status: DC

## 2013-03-15 MED ORDER — MAGNESIUM OXIDE 400 (241.3 MG) MG PO TABS
200.0000 mg | ORAL_TABLET | Freq: Every day | ORAL | Status: DC
  Administered 2013-03-16 – 2013-03-18 (×3): 200 mg via ORAL
  Filled 2013-03-15 (×3): qty 0.5

## 2013-03-15 MED ORDER — OXYCODONE-ACETAMINOPHEN 5-325 MG PO TABS
1.0000 | ORAL_TABLET | Freq: Two times a day (BID) | ORAL | Status: DC | PRN
  Administered 2013-03-16 – 2013-03-18 (×3): 1 via ORAL
  Filled 2013-03-15: qty 2
  Filled 2013-03-15: qty 1
  Filled 2013-03-15: qty 2
  Filled 2013-03-15: qty 1

## 2013-03-15 MED ORDER — SODIUM CHLORIDE 0.9 % IV SOLN
500.0000 mg | Freq: Two times a day (BID) | INTRAVENOUS | Status: AC
  Administered 2013-03-15 – 2013-03-18 (×6): 500 mg via INTRAVENOUS
  Filled 2013-03-15 (×7): qty 4

## 2013-03-15 MED ORDER — SENNA 8.6 MG PO TABS
1.0000 | ORAL_TABLET | Freq: Two times a day (BID) | ORAL | Status: DC
  Administered 2013-03-15 – 2013-03-18 (×5): 8.6 mg via ORAL
  Filled 2013-03-15 (×7): qty 1

## 2013-03-15 MED ORDER — ONDANSETRON HCL 4 MG/2ML IJ SOLN
4.0000 mg | Freq: Four times a day (QID) | INTRAMUSCULAR | Status: DC | PRN
  Administered 2013-03-17: 4 mg via INTRAVENOUS
  Filled 2013-03-15: qty 2

## 2013-03-15 MED ORDER — MORPHINE SULFATE ER 50 MG PO CP24: 50.0000 mg | ORAL_CAPSULE | Freq: Every evening | ORAL | Status: DC

## 2013-03-15 MED ORDER — HYDROMORPHONE HCL PF 1 MG/ML IJ SOLN
1.0000 mg | Freq: Once | INTRAMUSCULAR | Status: AC
  Administered 2013-03-15: 1 mg via INTRAVENOUS
  Filled 2013-03-15: qty 1

## 2013-03-15 MED ORDER — SODIUM CHLORIDE 0.9 % IJ SOLN
3.0000 mL | Freq: Two times a day (BID) | INTRAMUSCULAR | Status: DC
  Administered 2013-03-15: 3 mL via INTRAVENOUS
  Administered 2013-03-16: 21:00:00 via INTRAVENOUS
  Administered 2013-03-17 – 2013-03-18 (×3): 3 mL via INTRAVENOUS

## 2013-03-15 MED ORDER — DOCUSATE SODIUM 100 MG PO CAPS
200.0000 mg | ORAL_CAPSULE | Freq: Two times a day (BID) | ORAL | Status: DC
  Administered 2013-03-15: 100 mg via ORAL
  Administered 2013-03-16 – 2013-03-18 (×5): 200 mg via ORAL
  Filled 2013-03-15 (×3): qty 2
  Filled 2013-03-15: qty 1

## 2013-03-15 MED ORDER — LEVOTHYROXINE SODIUM 100 MCG PO TABS
100.0000 ug | ORAL_TABLET | Freq: Every day | ORAL | Status: DC
  Administered 2013-03-16 – 2013-03-18 (×3): 100 ug via ORAL
  Filled 2013-03-15 (×4): qty 1

## 2013-03-15 MED ORDER — INSULIN ASPART 100 UNIT/ML ~~LOC~~ SOLN
0.0000 [IU] | Freq: Three times a day (TID) | SUBCUTANEOUS | Status: DC
  Administered 2013-03-16 (×2): 3 [IU] via SUBCUTANEOUS
  Administered 2013-03-16 – 2013-03-17 (×2): 2 [IU] via SUBCUTANEOUS
  Administered 2013-03-17 – 2013-03-18 (×3): 3 [IU] via SUBCUTANEOUS

## 2013-03-15 MED ORDER — FINGOLIMOD HCL 0.5 MG PO CAPS
0.5000 mg | ORAL_CAPSULE | Freq: Every day | ORAL | Status: DC
  Administered 2013-03-16 – 2013-03-18 (×3): 0.5 mg via ORAL

## 2013-03-15 MED ORDER — LORATADINE 10 MG PO TABS
10.0000 mg | ORAL_TABLET | Freq: Every day | ORAL | Status: DC
  Administered 2013-03-16 – 2013-03-18 (×3): 10 mg via ORAL
  Filled 2013-03-15 (×3): qty 1

## 2013-03-15 NOTE — Consult Note (Signed)
NEURO HOSPITALIST CONSULT NOTE    Reason for Consult: Exacerbation of multiple sclerosis  HPI:                                                                                                                                          [name] is an 42 y.o. female with multiple sclerosis diagnosed in 2006, fibromyalgia, thyroid disease and sleep apnea, presenting with progressive weakness of left lower extremity and difficulty with walking as well as progression of tremor affecting right extremities more than left extremities and also affecting her speech. She was seen by Dr. [name] at [facility] on 03/05/2013 and had an MRI on 03/13/2013. MRI results pending. Her gait has become progressively more unstable. Patient has had her lying on family members as well as use of a cane for ambulation over the past one to 2 days. Patient is being admitted for a course of Solu-Medrol intravenously, 500 mg every 12 hours x6. She has been taking herGilenya for multiple sclerosis.  Past Medical History  Diagnosis Date  . MS (multiple sclerosis)   . Thyroid disease   . Fibromyalgia   . Sleep apnea   . Diabetes mellitus without complication     Past Surgical History  Procedure Laterality Date  . Hernia repair      Family History  Problem Relation Age of Onset  . Hypertension Mother   . High blood pressure Father   . Migraines Mother   . Lung cancer Father     Social History:  reports that she quit smoking about 24 years ago. She has never used smokeless tobacco. She reports that she does not drink alcohol or use illicit drugs.  No Known Allergies  MEDICATIONS:                                                                                                                     I have reviewed the patient's current medications. Prior to Admission:    ALPRAZolam (XANAX) 0.25 MG tablet           .  baclofen (LIORESAL) 10 MG tablet   Oral   Take 20 mg  by mouth 2 (two) times daily.       .  cetirizine (ZYRTEC) 10 MG  tablet   Oral   Take 10 mg by mouth daily.       .  Fingolimod HCl (GILENYA) 0.5 MG CAPS   Oral   Take 1 capsule (0.5 mg total) by mouth daily.   90 capsule   3   .  FLUoxetine (PROZAC) 20 MG capsule   Oral   Take 20 mg by mouth daily.       .  fluticasone (FLONASE) 50 MCG/ACT nasal spray   Nasal   Place 2 sprays into the nose daily.       Marland Kitchen  levothyroxine (SYNTHROID, LEVOTHROID) 75 MCG tablet   Oral   Take 100 mcg by mouth daily.       .  Magnesium Oxide 400 (240 MG) MG TABS   Oral   Take 40 mg by mouth daily.       .  meloxicam (MOBIC) 7.5 MG tablet   Oral   Take 7.5 mg by mouth daily.       .  modafinil (PROVIGIL) 200 MG tablet   Oral   Take 200 mg by mouth daily.       Marland Kitchen  morphine (KADIAN) 30 MG 24 hr capsule   Oral   Take 1 capsule (30 mg total) by mouth every morning.   30 capsule   0     Mail   .  morphine (KADIAN) 50 MG 24 hr capsule   Oral   Take 1 capsule (50 mg total) by mouth every evening.   30 capsule   0     Mail   .  oxyCODONE-acetaminophen (PERCOCET) 5-325 MG per tablet   Oral   Take 1 tablet by mouth 2 (two) times daily.   60 tablet   0     Mail   .  pregabalin (LYRICA) 75 MG capsule   Oral   Take 75 mg by mouth 2 (two) times daily.       .  Prenatal Vit-Fe Sulfate-FA (PRENATAL VITAMIN PO)   Oral   Take by mouth. Patient not pregnant these work better for her.       .  Vitamin D, Ergocalciferol, (DRISDOL) 50000 UNITS CAPS   Oral   Take 50,000 Units by mouth every 7 (seven) days.           ROS:                                                                                                                                       History obtained from the patient  General ROS: negative for - chills, fever, night sweats, weight gain or weight loss Psychological ROS: negative for - behavioral disorder, hallucinations, memory difficulties, mood swings or suicidal ideation Ophthalmic ROS: negative for - blurry  vision, double vision, eye pain or loss of vision ENT ROS: negative for - epistaxis, nasal discharge, oral lesions, sore throat, tinnitus or  vertigo Allergy and Immunology ROS: negative for - hives or itchy/watery eyes Hematological and Lymphatic ROS: negative for - bleeding problems, bruising or swollen lymph nodes Endocrine ROS: negative for - galactorrhea, hair pattern changes, polydipsia/polyuria or temperature intolerance Respiratory ROS: negative for - cough, hemoptysis, shortness of breath or wheezing Cardiovascular ROS: negative for - chest pain, dyspnea on exertion, edema or irregular heartbeat Gastrointestinal ROS: negative for - abdominal pain, diarrhea, hematemesis, nausea/vomiting or stool incontinence Genito-Urinary ROS: negative for - dysuria, hematuria, incontinence or urinary frequency/urgency Musculoskeletal ROS: negative for - joint swelling or muscular weakness Neurological ROS: as noted in HPI Dermatological ROS: negative for rash and skin lesion changes   Blood pressure 114/50, pulse 75, temperature 98.1 F (36.7 C), temperature source Oral, resp. rate 15, weight 141.069 kg (311 lb), SpO2 98.00%.   Neurologic Examination:                                                                                                      Mental Status: Alert, oriented, thought content appropriate.  Speech is somewhat telegraphic in character in addition to being slightly dysarthric. Able to follow commands without difficulty. Cranial Nerves: II-Visual fields were normal. III/IV/VI-Pupils were equal and reacted. Extraocular movements were full and conjugate.    V/VII-no facial numbness and no facial weakness. VIII-normal. X-slightly dysarthric speech; symmetrical palatal movement. Motor: Strength of upper and lower extremities was normal with manual testing; muscle tone was normal throughout. Patient had fairly coarse tremor involving head trunk and extremities, right greater than  left. Tremor was somewhat variable and at times was not present when she was distracted. Sensory: Normal throughout. Deep Tendon Reflexes: 2+ and symmetric. Plantars: Flexor bilaterally Cerebellar: Normal finger-to-nose testing and impaired commensurate with the extent of upper extremity tremors bilaterally.  No results found for this basename: cbc, bmp, coags, chol, tri, ldl, hga1c    Results for orders placed during the hospital encounter of 03/15/13 (from the past 48 hour(s))  CBC WITH DIFFERENTIAL     Status: Abnormal   Collection Time    03/15/13  4:10 PM      Result Value Range   WBC 10.7 (*) 4.0 - 10.5 K/uL   RBC 4.43  3.87 - 5.11 MIL/uL   Hemoglobin 12.1  12.0 - 15.0 g/dL   HCT 40.9  81.1 - 91.4 %   MCV 82.6  78.0 - 100.0 fL   MCH 27.3  26.0 - 34.0 pg   MCHC 33.1  30.0 - 36.0 g/dL   RDW 78.2  95.6 - 21.3 %   Platelets 282  150 - 400 K/uL   Neutrophils Relative % 92 (*) 43 - 77 %   Neutro Abs 9.9 (*) 1.7 - 7.7 K/uL   Lymphocytes Relative 5 (*) 12 - 46 %   Lymphs Abs 0.5 (*) 0.7 - 4.0 K/uL   Monocytes Relative 3  3 - 12 %   Monocytes Absolute 0.3  0.1 - 1.0 K/uL   Eosinophils Relative 0  0 - 5 %   Eosinophils Absolute 0.0  0.0 - 0.7 K/uL  Basophils Relative 0  0 - 1 %   Basophils Absolute 0.0  0.0 - 0.1 K/uL  COMPREHENSIVE METABOLIC PANEL     Status: Abnormal   Collection Time    03/15/13  4:10 PM      Result Value Range   Sodium 137  135 - 145 mEq/L   Potassium 4.1  3.5 - 5.1 mEq/L   Chloride 101  96 - 112 mEq/L   CO2 28  19 - 32 mEq/L   Glucose, Bld 134 (*) 70 - 99 mg/dL   BUN 11  6 - 23 mg/dL   Creatinine, Ser 1.61  0.50 - 1.10 mg/dL   Calcium 9.2  8.4 - 09.6 mg/dL   Total Protein 7.3  6.0 - 8.3 g/dL   Albumin 3.8  3.5 - 5.2 g/dL   AST 20  0 - 37 U/L   ALT 12  0 - 35 U/L   Alkaline Phosphatase 100  39 - 117 U/L   Total Bilirubin 0.3  0.3 - 1.2 mg/dL   GFR calc non Af Amer >90  >90 mL/min   GFR calc Af Amer >90  >90 mL/min   Comment:            The eGFR  has been calculated     using the CKD EPI equation.     This calculation has not been     validated in all clinical     situations.     eGFR's persistently     <90 mL/min signify     possible Chronic Kidney Disease.  GLUCOSE, CAPILLARY     Status: Abnormal   Collection Time    03/15/13  4:19 PM      Result Value Range   Glucose-Capillary 123 (*) 70 - 99 mg/dL   Comment 1 Documented in Chart     Comment 2 Notify RN      No results found.   Assessment/Plan: MS exacerbation with progressive deterioration of ambulatory ability in addition to worsening of tremor and increasing speech difficulty. There may be psychophysiologic factors contributing to the patient's presentation as well. Deficits have been getting progressively over the past 2-3 weeks and up to the point that patient can no longer function independently with activities of daily living.  Recommendations: 1. Solu-Medrol intravenously 500 mg every 12 hours for total of 6 doses 2. Continue Gilenya at current dose 3. Physical therapy, occupational therapy and speech therapy consults  Venetia Maxon M.D. [facility] [phone]  03/15/2013, 5:29 PM

## 2013-03-15 NOTE — ED Notes (Signed)
Pt A&Ox4, interactive, calm, NAD, resps e/u, dysarthria present, words clear, denies pain, states, bed is uncomfortable. Mother present at [facility]. NPO explained with rationale and plan. FM contacted regarding home MS med and home CPAP. Report called to Phil, RN 4N, pt going to room 4N 10.

## 2013-03-15 NOTE — H&P (Signed)
FMTS Attending Admission Note: Brooke Fant,MD I  have seen and examined this patient, reviewed their chart. I have discussed this patient with the resident. I agree with the resident's findings, assessment and care plan.  42 Y/O Female with Pmx MS present to the ED with few week hx of gait imbalance which had gradually worsened,this is associated with speech difficulty, I could barely understand patient.She has had similar episode in the past with her MS for which she was treated with steroid by her neurologist,but steroid was delayed this time so she can get MRI done to assess her MS. She denies any recent fall at home,she feels weak and does have muscle spasm with movement of her joints especially her lower limbs. She feels more weak on her right LL compared to the left.  Exam: Gen: Not in distress. Neuro: Awake,alert,oriented x 3, CN ii-VII,assessed intact,positive dysarthria.Power of right LL about 3/5,others normal,she had tremors with exam. No sensory loss,DTR intact.Gait not assessed at this time. Resp: Air entry equal B/L. CV: S1 S2 no murmurs. Abd: Benign. Ext: No edema.  A/P: 42 Y/O Female with MS flare up presenting as gait instability and dysarthria.        Appreciate neuro consult.        Continue steroid regimen as per neuro. Continue home med.        PT assessment.        Fall precaution.        Might benefit from speech therapy.  For chronic conditions,continue home regimen.

## 2013-03-15 NOTE — ED Provider Notes (Signed)
History     CSN: [id number]  Arrival date & time 03/15/13  1500   First MD Initiated Contact with Patient 03/15/13 1551      Chief Complaint  Patient presents with  . Multiple Sclerosis    (Consider location/radiation/quality/duration/timing/severity/associated sxs/prior treatment) HPI Comments: Pt presents to the ED for MS relapse.  Pt notes she went to [place], [place] on 02/20/13 and ever since returning she has had increased tremors, changes in gait and speech, and increased pain.  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  She recently saw her neurologist, Dr. [name], with labs completed and MRI.  Trial of PO steroids attempted but no changes in sx.  Over past few days, she has been unable to ambulate at all.  Also notes some increased LE edema, but states this is due to her Lyrica.  Denies any chest pain, SOB, palpitations, abdominal pain, N/V/D, fevers, sweats, or chills.  The history is provided by the patient.    Past Medical History  Diagnosis Date  . MS (multiple sclerosis)   . Thyroid disease   . Fibromyalgia   . Sleep apnea   . Diabetes mellitus without complication     Past Surgical History  Procedure Laterality Date  . Hernia repair      Family History  Problem Relation Age of Onset  . Hypertension Mother   . High blood pressure Father   . Migraines Mother   . Lung cancer Father     History  Substance Use Topics  . Smoking status: Former Smoker    Quit date: 10/01/1988  . Smokeless tobacco: Never Used  . Alcohol Use: No     Comment: Quit alcohol consumption in 1990's    OB History   Grav Para Term Preterm Abortions TAB SAB Ect Mult Living                  Review of Systems  Musculoskeletal: Positive for myalgias and gait problem.  Neurological: Positive for tremors and speech difficulty.  All other systems reviewed and are negative.    Allergies  Review of patient's allergies indicates no known allergies.  Home  Medications   Current Outpatient Rx  Name  Route  Sig  Dispense  Refill  . ALPRAZolam (XANAX) 0.25 MG tablet               . baclofen (LIORESAL) 10 MG tablet   Oral   Take 20 mg by mouth 2 (two) times daily.         . cetirizine (ZYRTEC) 10 MG tablet   Oral   Take 10 mg by mouth daily.         . Fingolimod HCl (GILENYA) 0.5 MG CAPS   Oral   Take 1 capsule (0.5 mg total) by mouth daily.   90 capsule   3   . FLUoxetine (PROZAC) 20 MG capsule   Oral   Take 20 mg by mouth daily.         . fluticasone (FLONASE) 50 MCG/ACT nasal spray   Nasal   Place 2 sprays into the nose daily.         Marland Kitchen levothyroxine (SYNTHROID, LEVOTHROID) 75 MCG tablet   Oral   Take 100 mcg by mouth daily.          . Magnesium Oxide 400 (240 MG) MG TABS   Oral   Take 40 mg by mouth daily.         Marland Kitchen  meloxicam (MOBIC) 7.5 MG tablet   Oral   Take 7.5 mg by mouth daily.         . modafinil (PROVIGIL) 200 MG tablet   Oral   Take 200 mg by mouth daily.         Marland Kitchen morphine (KADIAN) 30 MG 24 hr capsule   Oral   Take 1 capsule (30 mg total) by mouth every morning.   30 capsule   0     Mail   . morphine (KADIAN) 50 MG 24 hr capsule   Oral   Take 1 capsule (50 mg total) by mouth every evening.   30 capsule   0     Mail   . oxyCODONE-acetaminophen (PERCOCET) 5-325 MG per tablet   Oral   Take 1 tablet by mouth 2 (two) times daily.   60 tablet   0     Mail   . pregabalin (LYRICA) 75 MG capsule   Oral   Take 75 mg by mouth 2 (two) times daily.         . Prenatal Vit-Fe Sulfate-FA (PRENATAL VITAMIN PO)   Oral   Take by mouth. Patient not pregnant these work better for her.         . Vitamin D, Ergocalciferol, (DRISDOL) 50000 UNITS CAPS   Oral   Take 50,000 Units by mouth every 7 (seven) days.           BP 146/81  Pulse 72  Temp(Src) 98.1 F (36.7 C) (Oral)  Resp 20  Wt 311 lb (141.069 kg)  BMI 51.75 kg/m2  SpO2 100%  Physical Exam  Nursing note and  vitals reviewed. Constitutional: She is oriented to person, place, and time. She appears well-developed and well-nourished. No distress.  HENT:  Head: Normocephalic and atraumatic.  Mouth/Throat: Oropharynx is clear and moist.  Eyes: Conjunctivae and EOM are normal. Pupils are equal, round, and reactive to light.  Neck: Normal range of motion. Neck supple.  Cardiovascular: Normal rate, regular rhythm and normal heart sounds.   Pulmonary/Chest: Effort normal and breath sounds normal. No respiratory distress. She has no wheezes.  Abdominal: Soft. Bowel sounds are normal. There is no tenderness. There is no guarding.  Musculoskeletal: Normal range of motion. She exhibits edema (2+ BLE).  Neurological: She is alert and oriented to person, place, and time. She displays tremor. No cranial nerve deficit or sensory deficit. She displays no seizure activity.  Tremors of RUE and BLE, sensation intact  Skin: Skin is warm and dry.  Psychiatric: She has a normal mood and affect.  Speech difficult to understand- foreign accent syndrome    ED Course  Procedures (including critical care time)  Labs Reviewed  CBC WITH DIFFERENTIAL - Abnormal; Notable for the following:    WBC 10.7 (*)    Neutrophils Relative % 92 (*)    Neutro Abs 9.9 (*)    Lymphocytes Relative 5 (*)    Lymphs Abs 0.5 (*)    All other components within normal limits  GLUCOSE, CAPILLARY - Abnormal; Notable for the following:    Glucose-Capillary 123 (*)    All other components within normal limits  COMPREHENSIVE METABOLIC PANEL   No results found.   1. Unstable gait   2. Multiple sclerosis       MDM   Labs as above.  IV dilaudid given for pain.  Consulted neuro, Dr. Roseanne Reno, he will review recent MRI results and consult if needed.  Consulted unassigned, pt will  be admitted to family medicine service.  VS stable for transfer.         Garlon Hatchet, PA-C 03/16/13 1600

## 2013-03-15 NOTE — H&P (Signed)
Family Medicine Teaching [facility] Admission History and Physical Service Pager: [phone]  Patient name: [name] [facility] Medical record number: [id number] Date of birth: [birth year] Age: 42 y.o. Gender: female  Primary Care Provider: Charlette Caffey, MD  Chief Complaint: weakness  Assessment and Plan: [name] is a 42 y.o. year old female presenting with weakness and speech difficulty from an MS flare. Neurology consulted in ED and recommended admission for IV steroid administration.  # MS Flare: - neurology consulting, appreciate neuro recommendations - IV solumedrol per neurology - PT/OT/SLP evaluations - continue home gilenya per neuro  # Psych: - continue home prozac, xanax prn  # Chronic pain: - continue home morphine PO, with percocet prn - continue lyrica, mobic  # Hypothyroidism: - check TSH - continue synthroid  # DM: - check HgbA1c - moderate SSI - anticipate that will need increased insulin due to hyperglycemia from high dose IV solumedrol  # Sleep apnea: - CPAP per RT while hospitalized  # FEN/GI: - stroke swallow screen, then carb modified diet if passes screen - SLIV - colace BID for constipation  # Prophylaxis: - heparin SQ  # Dispo:  - pending clinical improvement, PT/OT/SLP evals, further neurology recs  # Code Status: discussed with patient, she desires to be full code   History of Present Illness: [name] is a 42 y.o. year old female with a history of MS presenting with an acute MS exacerbation, manifested by speech difficulty and weakness of her legs.  Has had tremors in the past, but they started to get bad on the June 5. She has not been able to walk for the last two days. She is also in a lot of pain in her back and legs. She normally can walk unassisted without canes. 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 Her speech as it is now started  around Friday night/Saturday, with her not being able to talk at all. She had thoughts in her mind but was not able to get them out by speaking. Her speech has started coming back to her more now if she concentrates very hard.  Her neurologist is Dr. [name], who she first saw for the first time May 23. When this relapse started to get worse she saw her on June 5, and Dr. [name] didn't start her on steroids but wanted her to get an MRI instead. Yesterday she started a prednisone taper at 40mg  per day. She does not take chronic steroids, only takes them when she has a flare.   Right now her pain is better than when it first came in. The pain is in her low back and legs. Her left knee and down hurts worse because she has a leg tremor. Her right leg hurts around the hip, she thinks might be due to sciatica. In the ED she got a dose of dilaudid and her pain is improved. Normally she takes 30mg  of kadian in the morning and 50mg  at night. She also takes percocet for breakthrough pain and baclofen for spasms.  Denies fevers, dysuria, headaches, chest pain, shortness of breath, abdominal pain, vision changes. Does have constipation from her drugs.  Review Of Systems: Per HPI. Otherwise 12 point review of systems was performed and was unremarkable.  Patient Active Problem List   Diagnosis Date Noted  . Unstable gait 03/15/2013  . Morbid obesity 03/05/2013  . Anxiety state, unspecified 10/23/2012  . Essential and other specified forms  of tremor 10/23/2012  . Nontoxic uninodular goiter 10/23/2012  . Other abnormal glucose 10/23/2012  . Meralgia paresthetica 10/23/2012  . Unspecified vitamin D deficiency 10/23/2012  . Unspecified endocrine disorder 10/23/2012  . Thoracic or lumbosacral neuritis or radiculitis, unspecified 10/23/2012  . Abnormality of gait 10/23/2012  . Multiple sclerosis 10/23/2012   Past Medical History: Past Medical History  Diagnosis Date  . MS (multiple sclerosis)   . Thyroid disease    . Fibromyalgia   . Sleep apnea   . Diabetes mellitus without complication    Past Surgical History: Past Surgical History  Procedure Laterality Date  . Hernia repair      Home Medications: No current facility-administered medications on file prior to encounter.   Current Outpatient Prescriptions on File Prior to Encounter  Medication Sig Dispense Refill  . ALPRAZolam (XANAX) 0.25 MG tablet Take 0.25 mg by mouth daily.       . baclofen (LIORESAL) 10 MG tablet Take 20 mg by mouth 3 (three) times daily.       . cetirizine (ZYRTEC) 10 MG tablet Take 10 mg by mouth daily.      . Fingolimod HCl (GILENYA) 0.5 MG CAPS Take 1 capsule (0.5 mg total) by mouth daily.  90 capsule  3  . FLUoxetine (PROZAC) 20 MG capsule Take 20 mg by mouth daily.      . fluticasone (FLONASE) 50 MCG/ACT nasal spray Place 2 sprays into the nose daily.      Marland Kitchen levothyroxine (SYNTHROID, LEVOTHROID) 75 MCG tablet Take 100 mcg by mouth daily.       . Magnesium Oxide 400 (240 MG) MG TABS Take 40 mg by mouth daily.      . meloxicam (MOBIC) 7.5 MG tablet Take 7.5 mg by mouth daily.      . modafinil (PROVIGIL) 200 MG tablet Take 200 mg by mouth daily.      Marland Kitchen morphine (KADIAN) 30 MG 24 hr capsule Take 1 capsule (30 mg total) by mouth every morning.  30 capsule  0  . morphine (KADIAN) 50 MG 24 hr capsule Take 1 capsule (50 mg total) by mouth every evening.  30 capsule  0  . oxyCODONE-acetaminophen (PERCOCET) 5-325 MG per tablet Take 1 tablet by mouth 2 (two) times daily.  60 tablet  0  . pregabalin (LYRICA) 75 MG capsule Take 75 mg by mouth 2 (two) times daily.      . Prenatal Vit-Fe Sulfate-FA (PRENATAL VITAMIN PO) Take by mouth. Patient not pregnant these work better for her.        Social History: History  Substance Use Topics  . Smoking status: Former Smoker    Quit date: 10/01/1988  . Smokeless tobacco: Never Used  . Alcohol Use: No     Comment: Quit alcohol consumption in 1990's  She lives at home with her  mother and her 47 year old daughter.  For any additional social history documentation, please refer to relevant sections of EMR.  Family History: Family History  Problem Relation Age of Onset  . Hypertension Mother   . High blood pressure Father   . Migraines Mother   . Lung cancer Father     Allergies: No Known Allergies    Physical Exam: BP 114/50  Pulse 75  Temp(Src) 98.1 F (36.7 C) (Oral)  Resp 15  Wt 311 lb (141.069 kg)  BMI 51.75 kg/m2  SpO2 98% Exam: General: NAD, pleasant and cooperative HEENT: NCAT Cardiovascular: heart sounds distant and difficult to  auscultate per my exam, but RRR per Dr. Sherron Flemings Cruz's Respiratory: CTAB, NWOB Abdomen: soft, nontender to palpation Extremities: no calf tenderness or swelling Skin: no rashes noted Neuro: coarse tremor of both arms, right greater than left. Tremor of trunk present as well. Speech is dysarthric and slowed. Mentation clearly in tact, patient is excellent historian. Able to lift bilateral lower extremities off bed by 2-3 inches only.  Labs and Imaging:  CBC:    Component Value Date/Time   WBC 10.7* 03/15/2013 1610   WBC 4.2 03/05/2013 1520   HGB 12.1 03/15/2013 1610   HCT 36.6 03/15/2013 1610   PLT 282 03/15/2013 1610   MCV 82.6 03/15/2013 1610   NEUTROABS 9.9* 03/15/2013 1610   LYMPHSABS 0.5* 03/15/2013 1610   MONOABS 0.3 03/15/2013 1610   EOSABS 0.0 03/15/2013 1610   BASOSABS 0.0 03/15/2013 1610   Comprehensive Metabolic Panel:    Component Value Date/Time   NA 137 03/15/2013 1610   NA 141 03/05/2013 1520   K 4.1 03/15/2013 1610   CL 101 03/15/2013 1610   CO2 28 03/15/2013 1610   BUN 11 03/15/2013 1610   BUN 12 03/05/2013 1520   CREATININE 0.65 03/15/2013 1610   GLUCOSE 134* 03/15/2013 1610   GLUCOSE 90 03/05/2013 1520   CALCIUM 9.2 03/15/2013 1610   AST 20 03/15/2013 1610   ALT 12 03/15/2013 1610   ALKPHOS 100 03/15/2013 1610   BILITOT 0.3 03/15/2013 1610   PROT 7.3 03/15/2013 1610   PROT 6.3 03/05/2013 1520    ALBUMIN 3.8 03/15/2013 1610    Levert Feinstein, MD Family Medicine PGY-1 Service Pager [phone]  PGY-3 Addendum: Patient seen, examined.  Available data reviewed.  Agree with findings, assessment, and plan as outlined by Dr. Pollie Meyer.    [place], DO 03/15/2013 9:09 PM

## 2013-03-15 NOTE — ED Notes (Signed)
Pt in stating she is having an MS relapse, states she has been having changes in ability to walk, speech, and new tremors, states they tried oral steroids but that did not help, states they called back today and were told to come here for further evaluation. Pt was given impression they were sent here for direct admission but when they got here they were told they had to be seen through the emergency room to be admitted.

## 2013-03-16 DIAGNOSIS — G250 Essential tremor: Secondary | ICD-10-CM

## 2013-03-16 LAB — GLUCOSE, CAPILLARY
Glucose-Capillary: 99 mg/dL (ref 70–99)
Glucose-Capillary: 145 mg/dL — ABNORMAL HIGH (ref 70–99)

## 2013-03-16 LAB — CBC
MCH: 27.1 pg (ref 26.0–34.0)
Platelets: 283 10*3/uL (ref 150–400)
RBC: 4.47 MIL/uL (ref 3.87–5.11)
RDW: 13.3 % (ref 11.5–15.5)
WBC: 8.7 10*3/uL (ref 4.0–10.5)

## 2013-03-16 LAB — BASIC METABOLIC PANEL
Calcium: 9.1 mg/dL (ref 8.4–10.5)
Creatinine, Ser: 0.69 mg/dL (ref 0.50–1.10)
GFR calc non Af Amer: >90 mL/min (ref >90)
Sodium: 138 mEq/L (ref 135–145)

## 2013-03-16 LAB — TSH: TSH: 0.138 u[IU]/mL — ABNORMAL LOW (ref 0.350–4.500)

## 2013-03-16 LAB — HEMOGLOBIN A1C: Hgb A1c MFr Bld: 5.5 % (ref <5.7)

## 2013-03-16 MED ORDER — PANTOPRAZOLE SODIUM 40 MG PO TBEC
40.0000 mg | DELAYED_RELEASE_TABLET | Freq: Every day | ORAL | Status: DC
  Administered 2013-03-16 – 2013-03-18 (×3): 40 mg via ORAL
  Filled 2013-03-16 (×2): qty 1

## 2013-03-16 NOTE — Progress Notes (Signed)
FMTS Attending Daily Note: Atilano Covelli MD [phone] pager office [phone] I  have seen and examined this patient, reviewed their chart. I have discussed this patient with the resident. I agree with the resident's findings, assessment and care plan. 

## 2013-03-16 NOTE — Progress Notes (Signed)
Family Medicine Teaching Service Daily Progress Note Service Pager: [phone]   Subjective: Pt reports she is doing better this morning. Her pain is at its baseline (chronic back pain). Feels stronger today.  Objective: Temp:  [97.3 F (36.3 C)-98.3 F (36.8 C)] 98.1 F (36.7 C) (06/16 0950) Pulse Rate:  [66-76] 66 (06/16 0950) Resp:  [9-24] 18 (06/16 0950) BP: (112-155)/(50-81) 129/69 mmHg (06/16 0950) SpO2:  [98 %-100 %] 98 % (06/16 0950) Weight:  [311 lb (141.069 kg)-317 lb 7.4 oz (144 kg)] 317 lb 7.4 oz (144 kg) (06/15 2248) Exam: General: NAD, standing at bedside bathing with assistant of aides Cardiovascular: RRR, quiet 1/6 SM loudest at LUSB Respiratory: CTAB, mildly increased WOB after standing Abdomen: soft, obese, nontender to palpation Neuro: less tremor today than yesterday. Alert and grossly oriented. Speech improved from yesterday but is still broken. EOMI. Grip 5/5 bilaterally. More strength in legs today.  I have reviewed the patient's medications, labs, imaging, and diagnostic testing.  Notable results are summarized below.  Medications:  Scheduled Meds: . ALPRAZolam  0.25 mg Oral BID  . baclofen  20 mg Oral TID  . docusate sodium  200 mg Oral BID  . Fingolimod HCl  0.5 mg Oral Daily  . FLUoxetine  20 mg Oral Daily  . fluticasone  2 spray Each Nare Daily  . heparin  5,000 Units Subcutaneous Q8H  . insulin aspart  0-15 Units Subcutaneous TID WC  . insulin aspart  0-5 Units Subcutaneous QHS  . levothyroxine  100 mcg Oral Daily  . loratadine  10 mg Oral Daily  . magnesium oxide  200 mg Oral Daily  . meloxicam  7.5 mg Oral Daily  . methylPREDNISolone (SOLU-MEDROL) injection  500 mg Intravenous Q12H  . modafinil  200 mg Oral Daily  . morphine  30 mg Oral Daily  . morphine  60 mg Oral QHS  . pregabalin  75 mg Oral BID  . senna  1 tablet Oral BID  . sodium chloride  3 mL Intravenous Q12H   Continuous Infusions:  PRN Meds:.sodium chloride, ondansetron  (ZOFRAN) IV, ondansetron, oxyCODONE-acetaminophen, sodium chloride  Labs:  CBC  Recent Labs Lab 03/15/13 1610 03/16/13 0600  WBC 10.7* 8.7  HGB 12.1 12.1  HCT 36.6 36.6  PLT 282 283     CMET  Recent Labs Lab 03/15/13 1610 03/16/13 0600  NA 137 138  K 4.1 4.2  CL 101 102  CO2 28 27  BUN 11 14  CREATININE 0.65 0.69  GLUCOSE 134* 195*  CALCIUM 9.2 9.1  AST 20  --   ALT 12  --   ALKPHOS 100  --   PROT 7.3  --   ALBUMIN 3.8  --       A1c pending TSH pending  Assessment/Plan:  [name] is a 42 y.o. year old female presenting with weakness and speech difficulty from an MS flare.    # MS Flare: seems improved since yesterday after a dose of IV steroid - neurology consulting, appreciate neuro recommendations  - IV solumedrol per neurology  - continue home gilenya and other MS drugs per neuro  [ ]  f/u PT/OT/SLP evaluations  [ ]  f/u outpatient MRI from 6/13 (not read yet)  # Psych:  - continue home prozac, xanax prn   # Chronic pain: stable per patient - continue home morphine PO, with percocet prn  - continue lyrica, mobic   # Hypothyroidism:  - continue synthroid  [ ]  f/u TSH   #  DM:  - moderate SSI  - CBG currently less than 200, anticipate that will need increased insulin due to hyperglycemia from high dose IV solumedrol  [ ]  f/u A1c  # Sleep apnea:  - CPAP per RT while hospitalized   # FEN/GI:  - carb modified diet - SLIV  - colace BID for constipation   # Prophylaxis:  - heparin SQ   # Dispo:  - pending clinical improvement, PT/OT/SLP evals, further neurology recs   # Code Status: discussed with patient upon admission, she desires to be full code   Levert Feinstein, MD [facility] Medicine PGY-1 Service Pager [phone]

## 2013-03-16 NOTE — Progress Notes (Signed)
NEURO HOSPITALIST PROGRESS NOTE   SUBJECTIVE:                                                                                                                        Patient has no complaints, she feels her speech has improved significantly. She has no strength complaints.  OBJECTIVE:                                                                                                                           Vital signs in last 24 hours: Temp:  [97.3 F (36.3 C)-98.3 F (36.8 C)] 98.1 F (36.7 C) (06/16 0950) Pulse Rate:  [66-76] 66 (06/16 0950) Resp:  [9-24] 18 (06/16 0950) BP: (112-155)/(50-81) 129/69 mmHg (06/16 0950) SpO2:  [98 %-100 %] 98 % (06/16 0950) Weight:  [141.069 kg (311 lb)-144 kg (317 lb 7.4 oz)] 144 kg (317 lb 7.4 oz) (06/15 2248)  Intake/Output from previous day:   Intake/Output this shift: Total I/O In: 240 [P.O.:240] Out: -  Nutritional status: Carb Control  Past Medical History  Diagnosis Date  . MS (multiple sclerosis)   . Thyroid disease   . Fibromyalgia   . Sleep apnea   . Diabetes mellitus without complication     Neurologic ROS negative with exception of above. Musculoskeletal ROS no complaints  Neurologic Exam:  Mental Status: Alert, oriented, thought content appropriate.  Speech telegraphic without evidence of aphasia.  Able to follow 3 step commands without difficulty. Cranial Nerves: II: Visual fields grossly normal, pupils equal, round, reactive to light and accommodation III,IV, VI: ptosis not present, extra-ocular motions intact bilaterally V,VII: smile symmetric, facial light touch sensation normal bilaterally VIII: hearing normal bilaterally IX,X: gag reflex present XI: bilateral shoulder shrug XII: midline tongue extension Motor: Right : Upper extremity   5/5    Left:     Upper extremity   5/5  Lower extremity   5/5     Lower extremity   5/5 Tone and bulk:normal tone throughout; no atrophy  noted Sensory: Pinprick and light touch intact throughout, bilaterally Deep Tendon Reflexes: 2+ and symmetric throughout UE, bilateral KJ 0/4 and no AJ Plantars: Right: downgoing   Left: downgoing Cerebellar: normal finger-to-nose,  normal heel-to-shin test CV: pulses  palpable throughout    Lab Results: No results found for this basename: cbc, bmp, coags, chol, tri, ldl, hga1c   Lipid Panel No results found for this basename: CHOL, TRIG, HDL, CHOLHDL, VLDL, LDLCALC,  in the last 72 hours  Studies/Results: No results found.  MEDICATIONS                                                                                                                        Scheduled: . ALPRAZolam  0.25 mg Oral BID  . baclofen  20 mg Oral TID  . docusate sodium  200 mg Oral BID  . Fingolimod HCl  0.5 mg Oral Daily  . FLUoxetine  20 mg Oral Daily  . fluticasone  2 spray Each Nare Daily  . heparin  5,000 Units Subcutaneous Q8H  . insulin aspart  0-15 Units Subcutaneous TID WC  . insulin aspart  0-5 Units Subcutaneous QHS  . levothyroxine  100 mcg Oral Daily  . loratadine  10 mg Oral Daily  . magnesium oxide  200 mg Oral Daily  . meloxicam  7.5 mg Oral Daily  . methylPREDNISolone (SOLU-MEDROL) injection  500 mg Intravenous Q12H  . modafinil  200 mg Oral Daily  . morphine  30 mg Oral Daily  . morphine  60 mg Oral QHS  . pregabalin  75 mg Oral BID  . senna  1 tablet Oral BID  . sodium chloride  3 mL Intravenous Q12H    ASSESSMENT/PLAN:                                                                                                             MS exacerbation. Patient has received one dose of 500 mg Solumedrol and has had no SE. She is improving with both speech and strength.    Recommendations:  1. Solu-Medrol intravenously 500 mg every 12 hours for total of 6 doses --Patient has 5 doses left 2. Continue Gilenya at current dose  3. Continue physical therapy, occupational therapy and speech  therapy consults   Assessment and plan discussed with with attending physician and they are in agreement.    Felicie Morn PA-C [facility] [phone]  03/16/2013, 10:07 AM

## 2013-03-16 NOTE — Evaluation (Addendum)
Speech Language Pathology Evaluation Patient Details Name: MADALINA ROSMAN MRN: [id number] DOB: Dec 09, 1970 Today's Date: 03/16/2013 Time: 0929-1002 SLP Time Calculation (min): 33 min  Problem List:  Patient Active Problem List   Diagnosis Date Noted  . Unstable gait 03/15/2013  . Morbid obesity 03/05/2013  . Anxiety state, unspecified 10/23/2012  . Essential and other specified forms of tremor 10/23/2012  . Nontoxic uninodular goiter 10/23/2012  . Other abnormal glucose 10/23/2012  . Meralgia paresthetica 10/23/2012  . Unspecified vitamin D deficiency 10/23/2012  . Unspecified endocrine disorder 10/23/2012  . Thoracic or lumbosacral neuritis or radiculitis, unspecified 10/23/2012  . Abnormality of gait 10/23/2012  . Multiple sclerosis 10/23/2012   Past Medical History:  Past Medical History  Diagnosis Date  . MS (multiple sclerosis)   . Thyroid disease   . Fibromyalgia   . Sleep apnea   . Diabetes mellitus without complication    Past Surgical History:  Past Surgical History  Procedure Laterality Date  . Hernia repair     HPI:  MS exacerbation with progressive deterioration of ambulatory ability in addition to worsening of tremor and increasing speech difficulty. There may be psychophysiologic factors contributing to the patient's presentation as well. Deficits have been getting progressively over the past 2-3 weeks and up to the point that patient can no longer function independently with activities of daily living.  Pt will get IV solumedrol during hospital stay.    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 Her articulation is consistently different from baseline with alteration of vowels and some consonants as if she had an accent typical of a [place] person. (ex:  [??rd] --> /t??/ "third"articulated "tud"). 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 Pt is able to name items and write sentences with grammatic accuracy. Her cognition is WNL.    There are published case studies of FAS and agrammatism in pts with neurodegenerative disorders. See Lavinia Sharps, Macoir J, Paquet N, Fossard M, Gagnon L. Psychogenic or neurogenic origin of agrammatism and foreign accent syndrome in a bipolar patient: a case report. Annals of General Psychiatry. 2007;6:1. [PMC free article] [PubMed]  Etiology of impairment may be neurogenic or psychogenic. Regardless, her communicative function and ADLs are impacted, so SLP will continue to follow to facilitate fluency and utilize compensatory strategies. If function does not rapidly improve with treatment she may need f/u with outpatient SLP.     SLP Assessment  Patient needs continued Speech Lanaguage Pathology Services    Follow Up Recommendations       Frequency and Duration min 2x/week  2 weeks   Pertinent Vitals/Pain NA   SLP Goals  SLP Goals Potential to Achieve Goals: Good SLP Goal #1: Pt will verbally correct her own agrammatical phrases given moderate written cues (pt will transcribe speech and write correctly and repeat, fading to verbal cues only) x10.   SLP Evaluation Prior Functioning  Cognitive/Linguistic Baseline: Baseline deficits Baseline deficit details: Foreign language syndrome with MS flare up per pt.  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 [place] Vocation: On disability   Cognition  Overall Cognitive Status: Within Functional Limits for tasks assessed Orientation Level: Oriented X4    Comprehension  Auditory Comprehension Overall Auditory Comprehension: Appears within functional limits for tasks assessed Reading Comprehension Reading Status: Within funtional limits  Expression Expression Primary Mode of Expression: Verbal Verbal Expression Overall  Verbal Expression: Impaired Initiation: No impairment Automatic Speech: Name;Month of year Level of Generative/Spontaneous Verbalization: Conversation Repetition: No impairment Naming: No impairment Pragmatics: No impairment Other Verbal Expression Comments: telegraphic, agrammatic Written Expression Dominant Hand: Right   Oral / Motor Oral Motor/Sensory Function Overall Oral Motor/Sensory Function: Appears within functional limits for tasks assessed Motor Speech Overall Motor Speech: Impaired Respiration: Within functional limits Phonation: Normal Resonance: Within functional limits Articulation: Impaired Level of Impairment: Word Intelligibility: Intelligible Motor Planning: Witnin functional limits Motor Speech Errors: Aware   GO    Harlon Ditty, MA CCC-SLP [phone]  Claudine Mouton 03/16/2013, 10:28 AM

## 2013-03-16 NOTE — Evaluation (Signed)
Physical Therapy Evaluation Patient Details Name: [name] MRN: [id number] DOB: June 17, 1971 Today's Date: 03/16/2013 Time: 4098-1191 PT Time Calculation (min): 32 min  PT Assessment / Plan / Recommendation Clinical Impression  Patient is a 42 y/o female admitted with MS exacerbation with tremors and difficulty walking and talking.  She presents with intermittent tremors, and abnormal gait, but low risk for falls per Sharlene Motts balance test (54/56.)  Do not feel pt needs skilled PT at this time.  Did discuss having walker for use in case of exacerbations and would recommend rollator (bariatric,) due to limited activity tolerance with MS and h/o fibromyalgia.  No follow up PT at this time.  Did discuss follow through on her plan to check with [facility] in [place] regarding silver sneakers program and participating in water aerobics when finished with sterioids and cleared by MD.    PT Assessment  Patent does not need any further PT services    Follow Up Recommendations  No PT follow up    Does the patient have the potential to tolerate intense rehabilitation    N/A  Barriers to Discharge  None      Equipment Recommendations  Other (comment) (Bariatric rollator walker (4 wheels and seat))    Recommendations for Other Services   None  Frequency   N/A   Precautions / Restrictions Precautions Precautions: None   Pertinent Vitals/Pain 7/10 in back      Mobility  Bed Mobility Bed Mobility: Not assessed Details for Bed Mobility Assistance: Pt in chair Transfers Transfers: Sit to Stand;Stand to Sit Sit to Stand: From chair/3-in-1;6: Modified independent (Device/Increase time);From bed Stand to Sit: 6: Modified independent (Device/Increase time);With armrests;To chair/3-in-1;To bed Ambulation/Gait Ambulation/Gait Assistance: 4: Min guard;5: Supervision Ambulation Distance (Feet): 200 Feet Assistive device: None Gait Pattern: Wide base of support;Lateral trunk lean to right;Lateral  trunk lean to left;Shuffle;Decreased stride length      PT Goals    Visit Information  Last PT Received On: 03/16/13 Assistance Needed: +1    Subjective Data  Subjective: Feel little better.   Patient Stated Goal: To go home   Prior Functioning  Home Living Lives With: Family Available Help at Discharge: Family Type of Home: House Home Access: Level entry Home Layout: One level Bathroom Shower/Tub: Tub/shower unit;Curtain Firefighter: Standard Home Adaptive Equipment: Straight cane;Shower chair with back Prior Function Level of Independence: Independent Driving: Yes Vocation: On disability Comments: out of work since 2008 Communication Communication: Expressive difficulties Dominant Hand: Right    Cognition  Cognition Arousal/Alertness: Awake/alert Behavior During Therapy: WFL for tasks assessed/performed Overall Cognitive Status: Within Functional Limits for tasks assessed    Extremity/Trunk Assessment Right Lower Extremity Assessment RLE ROM/Strength/Tone: WFL for tasks assessed RLE Sensation: WFL - Light Touch RLE Coordination: WFL - gross/fine motor Left Lower Extremity Assessment LLE ROM/Strength/Tone: Deficits LLE ROM/Strength/Tone Deficits: AROM WFL, strength grossly 4/5; positive tremors at time. LLE Sensation: WFL - Light Touch LLE Coordination: WFL - gross/fine motor   Balance Standardized Balance Assessment Standardized Balance Assessment: Berg Balance Test Berg Balance Test Sit to Stand: Able to stand without using hands and stabilize independently Standing Unsupported: Able to stand safely 2 minutes Sitting with Back Unsupported but Feet Supported on Floor or Stool: Able to sit safely and securely 2 minutes Stand to Sit: Sits safely with minimal use of hands Transfers: Able to transfer safely, minor use of hands Standing Unsupported with Eyes Closed: Able to stand 10 seconds safely Standing Ubsupported with Feet Together: Able to  place feet  together independently and stand 1 minute safely From Standing, Reach Forward with Outstretched Arm: Can reach confidently >25 cm (10") From Standing Position, Pick up Object from Floor: Able to pick up shoe safely and easily From Standing Position, Turn to Look Behind Over each Shoulder: Looks behind from both sides and weight shifts well Turn 360 Degrees: Able to turn 360 degrees safely but slowly Standing Unsupported, Alternately Place Feet on Step/Stool: Able to stand independently and safely and complete 8 steps in 20 seconds Standing Unsupported, One Foot in Front: Able to place foot tandem independently and hold 30 seconds Standing on One Leg: Able to lift leg independently and hold > 10 seconds Total Score: 54  End of Session PT - End of Session Equipment Utilized During Treatment: Gait belt Activity Tolerance: Patient tolerated treatment well Patient left: in chair;with call bell/phone within reach  GP     [facility] 03/16/2013, 2:36 PM [place], [place] [phone] 03/16/2013

## 2013-03-17 LAB — GLUCOSE, CAPILLARY
Glucose-Capillary: 138 mg/dL — ABNORMAL HIGH (ref 70–99)
Glucose-Capillary: 138 mg/dL — ABNORMAL HIGH (ref 70–99)

## 2013-03-17 MED ORDER — POLYETHYLENE GLYCOL 3350 17 G PO PACK
17.0000 g | PACK | Freq: Every day | ORAL | Status: DC
  Administered 2013-03-17: 17 g via ORAL
  Filled 2013-03-17 (×2): qty 1

## 2013-03-17 NOTE — Progress Notes (Signed)
Family Medicine Teaching Service Daily Progress Note Service Pager: [phone]   Subjective: Pt reports she is doing well this AM. Denies any acute pain. Ambulating with more ease.  Objective: Temp:  [97.7 F (36.5 C)-98.3 F (36.8 C)] 98 F (36.7 C) (06/17 0600) Pulse Rate:  [59-69] 65 (06/17 0600) Resp:  [18-20] 20 (06/17 0600) BP: (129-152)/(62-76) 146/75 mmHg (06/17 0600) SpO2:  [97 %-99 %] 97 % (06/17 0600) Exam: General: NAD, sitting on toilet Head: NCAT Respiratory: normal respiratory effort Neuro: sits comfortably on toilet. Alert and grossly oriented. Speech much improved from admission, now with Jamaican-type accent. Requires much less effort now to speak fluently.  I have reviewed the patient's medications, labs, imaging, and diagnostic testing.  Notable results are summarized below.  Medications:  Scheduled Meds: . ALPRAZolam  0.25 mg Oral BID  . baclofen  20 mg Oral TID  . docusate sodium  200 mg Oral BID  . Fingolimod HCl  0.5 mg Oral Daily  . FLUoxetine  20 mg Oral Daily  . fluticasone  2 spray Each Nare Daily  . heparin  5,000 Units Subcutaneous Q8H  . insulin aspart  0-15 Units Subcutaneous TID WC  . insulin aspart  0-5 Units Subcutaneous QHS  . levothyroxine  100 mcg Oral Daily  . loratadine  10 mg Oral Daily  . magnesium oxide  200 mg Oral Daily  . meloxicam  7.5 mg Oral Daily  . methylPREDNISolone (SOLU-MEDROL) injection  500 mg Intravenous Q12H  . modafinil  200 mg Oral Daily  . morphine  30 mg Oral Daily  . morphine  60 mg Oral QHS  . pantoprazole  40 mg Oral Daily  . pregabalin  75 mg Oral BID  . senna  1 tablet Oral BID  . sodium chloride  3 mL Intravenous Q12H   Continuous Infusions:  PRN Meds:.sodium chloride, ondansetron (ZOFRAN) IV, ondansetron, oxyCODONE-acetaminophen, sodium chloride  Labs:  CBC  Recent Labs Lab 03/15/13 1610 03/16/13 0600  WBC 10.7* 8.7  HGB 12.1 12.1  HCT 36.6 36.6  PLT 282 283     CMET  Recent  Labs Lab 03/15/13 1610 03/16/13 0600  NA 137 138  K 4.1 4.2  CL 101 102  CO2 28 27  BUN 11 14  CREATININE 0.65 0.69  GLUCOSE 134* 195*  CALCIUM 9.2 9.1  AST 20  --   ALT 12  --   ALKPHOS 100  --   PROT 7.3  --   ALBUMIN 3.8  --        MRI 6/13: 1. Few periventricular and subcortical T2 hyperintensities. Some of these are hypointense on T1 views. Consistent with chronic demyelinating plaques. 2. No abnormal lesions are seen on post contrast views. No acute plaques. 3. No change from MRI on 06/25/11.   Assessment/Plan:  [name] is a 42 y.o. year old female presenting with weakness and speech difficulty from an MS flare.    # MS Flare: seems improved since yesterday after a dose of IV steroid - neurology consulting, appreciate neuro recommendations  - IV solumedrol per neurology  - continue home gilenya and other MS drugs per neuro  - PT rec no f/u, SLP will continue while inpatient (may need outpt SLP if no further improvement) [ ]  f/u OT evaluation  # Psych:  - continue home prozac, xanax prn   # Chronic pain: stable per patient - continue home morphine PO, with percocet prn  - continue lyrica, mobic   #  Hypothyroidism:  - continue synthroid  - TSH 0.138 [ ]  defer change in synthroid dose to PCP  # Hyperglycemia:  - Hgb A1c 5.5 - moderate SSI  - CBG remain less than 200, anticipate that will need increased insulin due to hyperglycemia from high dose IV solumedrol   # Sleep apnea:  - CPAP per RT while hospitalized   # FEN/GI:  - carb modified diet - SLIV  - colace BID for constipation   # Prophylaxis:  - heparin SQ   # Dispo:  - likely d/c home in AM after gets last solumedrol dose  # Code Status: discussed with patient upon admission, she desires to be full code   Levert Feinstein, MD [facility] Medicine PGY-1 Service Pager [phone]

## 2013-03-17 NOTE — Progress Notes (Signed)
Subjective: Speech is improved. Patient is no longer experiencing tremor. Gait stability also improved. She's tolerating IV Solu-Medrol well with no significant side effects except for upper GI discomfort. She's getting Protonix 40 mg per day. .  Objective: Current vital signs: BP 146/75  Pulse 65  Temp(Src) 98 F (36.7 C) (Oral)  Resp 20  Ht 5\' 4"  (1.626 m)  Wt 144 kg (317 lb 7.4 oz)  BMI 54.47 kg/m2  SpO2 97%  Neurologic Exam: Alert in no acute distress. Patient is well-oriented to time as well as place. Speech is nearly to normal. She still has slight telegraphic quality to speech flow. No tremor noted including a tremor of extremities, trunk nor head. Moves extremities well with equal strength. Coordination was normal.  Lab Results: Results for orders placed during the hospital encounter of 03/15/13 (from the past 48 hour(s))  CBC WITH DIFFERENTIAL     Status: Abnormal   Collection Time    03/15/13  4:10 PM      Result Value Range   WBC 10.7 (*) 4.0 - 10.5 K/uL   RBC 4.43  3.87 - 5.11 MIL/uL   Hemoglobin 12.1  12.0 - 15.0 g/dL   HCT 78.2  95.6 - 21.3 %   MCV 82.6  78.0 - 100.0 fL   MCH 27.3  26.0 - 34.0 pg   MCHC 33.1  30.0 - 36.0 g/dL   RDW 08.6  57.8 - 46.9 %   Platelets 282  150 - 400 K/uL   Neutrophils Relative % 92 (*) 43 - 77 %   Neutro Abs 9.9 (*) 1.7 - 7.7 K/uL   Lymphocytes Relative 5 (*) 12 - 46 %   Lymphs Abs 0.5 (*) 0.7 - 4.0 K/uL   Monocytes Relative 3  3 - 12 %   Monocytes Absolute 0.3  0.1 - 1.0 K/uL   Eosinophils Relative 0  0 - 5 %   Eosinophils Absolute 0.0  0.0 - 0.7 K/uL   Basophils Relative 0  0 - 1 %   Basophils Absolute 0.0  0.0 - 0.1 K/uL  COMPREHENSIVE METABOLIC PANEL     Status: Abnormal   Collection Time    03/15/13  4:10 PM      Result Value Range   Sodium 137  135 - 145 mEq/L   Potassium 4.1  3.5 - 5.1 mEq/L   Chloride 101  96 - 112 mEq/L   CO2 28  19 - 32 mEq/L   Glucose, Bld 134 (*) 70 - 99 mg/dL   BUN 11  6 - 23 mg/dL   Creatinine, Ser 6.29  0.50 - 1.10 mg/dL   Calcium 9.2  8.4 - 52.8 mg/dL   Total Protein 7.3  6.0 - 8.3 g/dL   Albumin 3.8  3.5 - 5.2 g/dL   AST 20  0 - 37 U/L   ALT 12  0 - 35 U/L   Alkaline Phosphatase 100  39 - 117 U/L   Total Bilirubin 0.3  0.3 - 1.2 mg/dL   GFR calc non Af Amer >90  >90 mL/min   GFR calc Af Amer >90  >90 mL/min   Comment:            The eGFR has been calculated     using the CKD EPI equation.     This calculation has not been     validated in all clinical     situations.     eGFR's persistently     <90  mL/min signify     possible Chronic Kidney Disease.  GLUCOSE, CAPILLARY     Status: Abnormal   Collection Time    03/15/13  4:19 PM      Result Value Range   Glucose-Capillary 123 (*) 70 - 99 mg/dL   Comment 1 Documented in Chart     Comment 2 Notify RN    GLUCOSE, CAPILLARY     Status: None   Collection Time    03/15/13  9:27 PM      Result Value Range   Glucose-Capillary 99  70 - 99 mg/dL  BASIC METABOLIC PANEL     Status: Abnormal   Collection Time    03/16/13  6:00 AM      Result Value Range   Sodium 138  135 - 145 mEq/L   Potassium 4.2  3.5 - 5.1 mEq/L   Chloride 102  96 - 112 mEq/L   CO2 27  19 - 32 mEq/L   Glucose, Bld 195 (*) 70 - 99 mg/dL   BUN 14  6 - 23 mg/dL   Creatinine, Ser 1.61  0.50 - 1.10 mg/dL   Calcium 9.1  8.4 - 09.6 mg/dL   GFR calc non Af Amer >90  >90 mL/min   GFR calc Af Amer >90  >90 mL/min   Comment:            The eGFR has been calculated     using the CKD EPI equation.     This calculation has not been     validated in all clinical     situations.     eGFR's persistently     <90 mL/min signify     possible Chronic Kidney Disease.  CBC     Status: None   Collection Time    03/16/13  6:00 AM      Result Value Range   WBC 8.7  4.0 - 10.5 K/uL   RBC 4.47  3.87 - 5.11 MIL/uL   Hemoglobin 12.1  12.0 - 15.0 g/dL   HCT 04.5  40.9 - 81.1 %   MCV 81.9  78.0 - 100.0 fL   MCH 27.1  26.0 - 34.0 pg   MCHC 33.1  30.0 -  36.0 g/dL   RDW 91.4  78.2 - 95.6 %   Platelets 283  150 - 400 K/uL  HEMOGLOBIN A1C     Status: None   Collection Time    03/16/13  6:00 AM      Result Value Range   Hemoglobin A1C 5.5  <5.7 %   Comment: (NOTE)                                                                               According to the ADA Clinical Practice Recommendations for 2011, when     HbA1c is used as a screening test:      >=6.5%   Diagnostic of Diabetes Mellitus               (if abnormal result is confirmed)     5.7-6.4%   Increased risk of developing Diabetes Mellitus     References:Diagnosis and Classification of Diabetes Mellitus,Diabetes  ZOXW,9604,54(UJWJX 1):S62-S69 and Standards of Medical Care in             Diabetes - 2011,Diabetes Care,2011,34 (Suppl 1):S11-S61.   Mean Plasma Glucose 111  <117 mg/dL  TSH     Status: Abnormal   Collection Time    03/16/13  6:00 AM      Result Value Range   TSH 0.138 (*) 0.350 - 4.500 uIU/mL  GLUCOSE, CAPILLARY     Status: Abnormal   Collection Time    03/16/13  6:44 AM      Result Value Range   Glucose-Capillary 192 (*) 70 - 99 mg/dL  GLUCOSE, CAPILLARY     Status: Abnormal   Collection Time    03/16/13 11:17 AM      Result Value Range   Glucose-Capillary 183 (*) 70 - 99 mg/dL  GLUCOSE, CAPILLARY     Status: Abnormal   Collection Time    03/16/13  5:05 PM      Result Value Range   Glucose-Capillary 145 (*) 70 - 99 mg/dL  GLUCOSE, CAPILLARY     Status: Abnormal   Collection Time    03/16/13  9:40 PM      Result Value Range   Glucose-Capillary 138 (*) 70 - 99 mg/dL  GLUCOSE, CAPILLARY     Status: Abnormal   Collection Time    03/17/13  6:40 AM      Result Value Range   Glucose-Capillary 138 (*) 70 - 99 mg/dL   Comment 1 Documented in Chart     Comment 2 Notify RN      Studies/Results: No results found.  Medications:  I have reviewed the patient's current medications. Scheduled: . ALPRAZolam  0.25 mg Oral BID  . baclofen  20 mg Oral TID   . docusate sodium  200 mg Oral BID  . Fingolimod HCl  0.5 mg Oral Daily  . FLUoxetine  20 mg Oral Daily  . fluticasone  2 spray Each Nare Daily  . heparin  5,000 Units Subcutaneous Q8H  . insulin aspart  0-15 Units Subcutaneous TID WC  . insulin aspart  0-5 Units Subcutaneous QHS  . levothyroxine  100 mcg Oral Daily  . loratadine  10 mg Oral Daily  . magnesium oxide  200 mg Oral Daily  . meloxicam  7.5 mg Oral Daily  . methylPREDNISolone (SOLU-MEDROL) injection  500 mg Intravenous Q12H  . modafinil  200 mg Oral Daily  . morphine  30 mg Oral Daily  . morphine  60 mg Oral QHS  . pantoprazole  40 mg Oral Daily  . pregabalin  75 mg Oral BID  . senna  1 tablet Oral BID  . sodium chloride  3 mL Intravenous Q12H   Continuous:  BJY:NWGNFA chloride, ondansetron (ZOFRAN) IV, ondansetron, oxyCODONE-acetaminophen, sodium chloride  Assessment/Plan: Multiple sclerosis exacerbation with significant improvement in focal dysfunctions with intravenous Solu-Medrol.  No changes in current management recommended. Course of Solu-Medrol will finish tomorrow and patient to be discharged home in stable.  C.R. Roseanne Reno, MD [facility] [phone]  03/17/2013  8:48 AM

## 2013-03-17 NOTE — Progress Notes (Signed)
Pt stated that she did not need any help getting the CPAP on tonight, RT notified pt if any problems let RN know so they can call Respiratory.  RT to monitor and assess as needed.

## 2013-03-17 NOTE — Progress Notes (Signed)
Occupational Therapy Evaluation Patient Details Name: [name] MRN: [id number] DOB: [birth year] Today's Date: 03/17/2013 Time: 6644-0347 OT Time Calculation (min): 15 min  OT Assessment / Plan / Recommendation Clinical Impression  Patient admitted with MS exacerbation but presents to OT with symptoms mostly resolved and mod I level for ADLs. No further OT needs; will sign off.    OT Assessment  Patient does not need any further OT services    Follow Up Recommendations  No OT follow up;Supervision - Intermittent    Equipment Recommendations  None recommended by OT    Precautions / Restrictions Precautions Precautions: None Restrictions Weight Bearing Restrictions: No   Pertinent Vitals/Pain     ADL  Eating/Feeding: Performed;Independent Where Assessed - Eating/Feeding: Chair Grooming: Performed;Independent Where Assessed - Grooming: Supported sitting Upper Body Bathing: Simulated;Modified independent Where Assessed - Upper Body Bathing: Supported sitting Lower Body Bathing: Simulated;Modified independent Where Assessed - Lower Body Bathing: Supported sitting Upper Body Dressing: Simulated;Modified independent Where Assessed - Upper Body Dressing: Supported sitting Lower Body Dressing: Simulated;Modified independent Where Assessed - Lower Body Dressing: Supported sitting Toilet Transfer: Simulated;Modified independent Toilet Transfer Method: Sit to Barista: Regular height toilet Toileting - Clothing Manipulation and Hygiene: Simulated;Modified independent Where Assessed - Toileting Clothing Manipulation and Hygiene: Sit to stand from 3-in-1 or toilet Transfers/Ambulation Related to ADLs: Patient performing transfers mod I ADL Comments: limited by nausea    OT Goals    Visit Information  Last OT Received On: 03/17/13 Assistance Needed: +1    Subjective Data  Subjective: I feel nauseated Patient Stated Goal: to go home    Prior Functioning     Home Living Lives With: Family Available Help at Discharge: Family Type of Home: House Home Access: Level entry Home Layout: One level Bathroom Shower/Tub: Forensic scientist: Standard Prior Function Level of Independence: Independent Driving: Yes Vocation: On disability Communication Communication: Expressive difficulties Dominant Hand: Right         Vision/Perception Vision - History Baseline Vision: Wears glasses all the time Patient Visual Report: No change from baseline   Cognition  Cognition Arousal/Alertness: Awake/alert Behavior During Therapy: WFL for tasks assessed/performed Overall Cognitive Status: Within Functional Limits for tasks assessed    Extremity/Trunk Assessment Right Upper Extremity Assessment RUE ROM/Strength/Tone: [facility] for tasks assessed Left Upper Extremity Assessment LUE ROM/Strength/Tone: WFL for tasks assessed     End of Session OT - End of Session Activity Tolerance: Patient tolerated treatment well Patient left: in chair;with call bell/phone within reach Nurse Communication: Other (comment) (request for nausea medication)  GO     Larrissa Stivers A 03/17/2013, 11:32 AM

## 2013-03-18 ENCOUNTER — Other Ambulatory Visit (HOSPITAL_COMMUNITY): Payer: Self-pay | Admitting: Family Medicine

## 2013-03-18 ENCOUNTER — Telehealth: Payer: Self-pay | Admitting: Neurology

## 2013-03-18 ENCOUNTER — Other Ambulatory Visit: Payer: Self-pay | Admitting: *Deleted

## 2013-03-18 MED ORDER — POLYETHYLENE GLYCOL 3350 17 G PO PACK: 17.0000 g | PACK | Freq: Every day | ORAL | Status: DC

## 2013-03-18 MED ORDER — PANTOPRAZOLE SODIUM 40 MG PO TBEC: 40.0000 mg | DELAYED_RELEASE_TABLET | Freq: Every day | ORAL | Status: DC

## 2013-03-18 NOTE — Telephone Encounter (Signed)
I have called her.    MRI brain (with and without) demonstrating: 1. Few periventricular and subcortical T2 hyperintensities. Some of these are hypointense on T1 views. Consistent with chronic demyelinating plaques. 2. No abnormal lesions are seen on post contrast views. No acute plaques. 3. No change from MRI on 06/25/11.  She is at hospital, treated with steroid now, will be discharged to rehab

## 2013-03-18 NOTE — Care Management Note (Signed)
    Page 1 of 1   03/18/2013     4:12:15 PM   CARE MANAGEMENT NOTE 03/18/2013  Patient:  [name], [name]   Account Number:  [id number]  Date Initiated:  03/18/2013  Documentation initiated by:  Elmer Bales  Subjective/Objective Assessment:   Pt admitted with MS exacerbation     Action/Plan:   Will follow for discharge needs pending pT/OT evals   Anticipated DC Date:  03/18/2013   Anticipated DC Plan:  HOME/SELF CARE      DC Planning Services  CM consult      Choice offered to / List presented to:     DME arranged  WALKER      DME agency  [facility]        Status of service:  Completed, signed off Medicare Important Message given?   (If response is "NO", the following Medicare IM given date fields will be blank) Date Medicare IM given:   Date Additional Medicare IM given:    Discharge Disposition:  HOME/SELF CARE  Per UR Regulation:  Reviewed for med. necessity/level of care/duration of stay  If discussed at Long Length of Stay Meetings, dates discussed:    Comments:  03/18/13 1330 Elmer Bales RN, MSN CM- Met with patient to discuss discharge needs.  PT recommending bariatric rollator for home use.  Order recieved from Dr Berline Chough. Information faxed to [facility], who states equipment is a special order item, which they will order and have delivered to the patient's home in approximately  week.  Pt primarily uses a cane when ambulating, which she feels is adequate now that she is feeling better.  She anticipates using the walker when having exacerbations.  Pt's RN updated.

## 2013-03-18 NOTE — Progress Notes (Signed)
NEURO HOSPITALIST PROGRESS NOTE   SUBJECTIVE:                                                                                                                         Patient is feeling much improved with her strength, states she is able to do all her ADL's as prior to flair. 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  She states this aspect of her flairs often resolves over a period of days- week. She is to get her last dose of Solumedrol this AM at 10. Still no GI complaints. PT has seen and cleared patient and only recommendation is roller walker due to activity intolerance.  OBJECTIVE:                                                                                                                           Vital signs in last 24 hours: Temp:  [98.1 F (36.7 C)-98.8 F (37.1 C)] 98.1 F (36.7 C) (06/18 0627) Pulse Rate:  [56-61] 56 (06/18 0627) Resp:  [18-20] 18 (06/18 0627) BP: (117-150)/(65-74) 146/70 mmHg (06/18 0627) SpO2:  [96 %-100 %] 98 % (06/18 0627)  Intake/Output from previous day: 06/17 0701 - 06/18 0700 In: 560 [P.O.:560] Out: -  Intake/Output this shift: Total I/O In: 120 [P.O.:120] Out: -  Nutritional status: Carb Control  Past Medical History  Diagnosis Date  . MS (multiple sclerosis)   . Thyroid disease   . Fibromyalgia   . Sleep apnea   . Diabetes mellitus without complication     Neurologic Exam:  Mental Status: Alert, oriented, thought content appropriate.  Speech agramatical and at times broken but no evidence of aphasia.  Able to follow 3 step commands without difficulty. Cranial Nerves: II: Visual fields grossly normal, pupils equal, round, reactive to light and accommodation III,IV, VI: ptosis not present, extra-ocular motions intact bilaterally V,VII: smile symmetric, facial light touch sensation normal bilaterally VIII: hearing normal  bilaterally IX,X: gag reflex present XI: bilateral shoulder shrug XII: midline tongue extension Motor: Right : Upper extremity   5/5    Left:     Upper extremity   5/5  Lower extremity   5/5  Lower extremity   5/5 Tone and bulk:normal tone throughout; no atrophy noted Sensory: Pinprick and light touch intact throughout, bilaterally Deep Tendon Reflexes: 2+ and symmetric throughout UE, no KJ or AJ noted Plantars: Right: downgoing   Left: downgoing Cerebellar: normal finger-to-nose CV: pulses palpable throughout    Lab Results: No results found for this basename: cbc, bmp, coags, chol, tri, ldl, hga1c   Lipid Panel No results found for this basename: CHOL, TRIG, HDL, CHOLHDL, VLDL, LDLCALC,  in the last 72 hours  Studies/Results: No results found.  MEDICATIONS                                                                                                                        Scheduled: . ALPRAZolam  0.25 mg Oral BID  . baclofen  20 mg Oral TID  . docusate sodium  200 mg Oral BID  . Fingolimod HCl  0.5 mg Oral Daily  . FLUoxetine  20 mg Oral Daily  . fluticasone  2 spray Each Nare Daily  . heparin  5,000 Units Subcutaneous Q8H  . insulin aspart  0-15 Units Subcutaneous TID WC  . insulin aspart  0-5 Units Subcutaneous QHS  . levothyroxine  100 mcg Oral Daily  . loratadine  10 mg Oral Daily  . magnesium oxide  200 mg Oral Daily  . meloxicam  7.5 mg Oral Daily  . methylPREDNISolone (SOLU-MEDROL) injection  500 mg Intravenous Q12H  . modafinil  200 mg Oral Daily  . morphine  30 mg Oral Daily  . morphine  60 mg Oral QHS  . pantoprazole  40 mg Oral Daily  . polyethylene glycol  17 g Oral Daily  . pregabalin  75 mg Oral BID  . senna  1 tablet Oral BID  . sodium chloride  3 mL Intravenous Q12H    Felicie Morn PA-C [facility] [phone]  03/18/2013, 9:06 AM  Patient seen and examined.  Clinical course and management discussed.  Necessary edits  performed.  I agree with the above.  Assessment and plan of care developed and discussed below.    ASSESSMENT/PLAN:                                                                                                           42 year old female with MS exacerbation.  S/P full solumedrol dosing.  Feels she is close to baseline.  Reports no side effects to the steroids other than elevated blood sugars.    Recommendations: 1.  No further neurologic intervention at this  time.  Patient will not require a steroid taper at discharge.   2.  Patient to follow up with Dr. [name] at discharge.  To remain on maintenance multiple sclerosis medications.      Thana Farr, MD [facility] [phone]  03/18/2013  1:26 PM

## 2013-03-18 NOTE — Progress Notes (Signed)
FMTS Attending Daily Note: Sara Neal MD [phone] pager office [phone] I  have seen and examined this patient, reviewed their chart. I have discussed this patient with the resident. I agree with the resident's findings, assessment and care plan. 

## 2013-03-18 NOTE — Progress Notes (Signed)
Discharge order received. IV removed, vitals stable, no complaints, education and follow up info reviewed with pt and family. Equipment set up by unit RN CM, pt to be discharged home with mother. Nicie Milan, [place] Marie

## 2013-03-18 NOTE — Progress Notes (Signed)
Family Medicine Teaching Service Daily Progress Note Service Pager: [phone]   Subjective: Pt reports continued improvement with accent and denies any acute complaint. Has lots of questions this morning.  Objective: Temp:  [98 F (36.7 C)-98.8 F (37.1 C)] 98.4 F (36.9 C) (06/18 1400) Pulse Rate:  [56-63] 60 (06/18 1400) Resp:  [18-20] 18 (06/18 1400) BP: (117-150)/(65-79) 147/79 mmHg (06/18 1400) SpO2:  [96 %-100 %] 100 % (06/18 1400) Exam: General: NAD, lying in bed, obese Head: [place]/AT, EOMI, sclera clear Respiratory: normal respiratory effort, CTAB Neuro: Alert, no focal deficits, normal though accented speech (Jamaican-type accent), speaks easily at normal cadence and tone, moving all four extremities spontaneously  I have reviewed the patient's medications, labs, imaging, and diagnostic testing.  Notable results are summarized below.  Medications:  Scheduled Meds: . ALPRAZolam  0.25 mg Oral BID  . baclofen  20 mg Oral TID  . docusate sodium  200 mg Oral BID  . Fingolimod HCl  0.5 mg Oral Daily  . FLUoxetine  20 mg Oral Daily  . fluticasone  2 spray Each Nare Daily  . heparin  5,000 Units Subcutaneous Q8H  . insulin aspart  0-15 Units Subcutaneous TID WC  . insulin aspart  0-5 Units Subcutaneous QHS  . levothyroxine  100 mcg Oral Daily  . loratadine  10 mg Oral Daily  . magnesium oxide  200 mg Oral Daily  . meloxicam  7.5 mg Oral Daily  . modafinil  200 mg Oral Daily  . morphine  30 mg Oral Daily  . morphine  60 mg Oral QHS  . pantoprazole  40 mg Oral Daily  . polyethylene glycol  17 g Oral Daily  . pregabalin  75 mg Oral BID  . senna  1 tablet Oral BID  . sodium chloride  3 mL Intravenous Q12H   Continuous Infusions:  PRN Meds:.sodium chloride, ondansetron (ZOFRAN) IV, ondansetron, oxyCODONE-acetaminophen, sodium chloride  Labs:  CBC  Recent Labs Lab 03/15/13 1610 03/16/13 0600  WBC 10.7* 8.7  HGB 12.1 12.1  HCT 36.6 36.6  PLT 282 283      CMET  Recent Labs Lab 03/15/13 1610 03/16/13 0600  NA 137 138  K 4.1 4.2  CL 101 102  CO2 28 27  BUN 11 14  CREATININE 0.65 0.69  GLUCOSE 134* 195*  CALCIUM 9.2 9.1  AST 20  --   ALT 12  --   ALKPHOS 100  --   PROT 7.3  --   ALBUMIN 3.8  --        Recent Labs Lab 03/17/13 1124 03/17/13 1614 03/17/13 2249 03/18/13 0625 03/18/13 1123  GLUCAP 152* 152* 133* 159* 120*     Assessment/Plan:  Collie L White-Astucci is a 42 y.o. year old female presenting with weakness and speech difficulty from an MS flare.    # MS Flare: Continues to improve, received final dose of IV solumedrol today. - neurology consulting and recommend no further steroids at home - continue home gilenya and other MS drugs per neuro  - PT rec no f/u, SLP rec outpatient SLP if no improvement; PCP to f/u as pt has continued improvement - Discharging home with bariatric rollator, discussed with CM, should be shipped to pt's house this week. OT recommends intermittent supervision. CM consulted to facilitate. Mother is involved in pt's care.  # Psych:  - continue home prozac, xanax prn   # Chronic pain: stable per patient - continue home morphine PO, with percocet prn  -  continue lyrica, mobic   # Hypothyroidism:  - continue synthroid  - TSH 0.138 [ ]  defer change in synthroid dose to PCP  # Hyperglycemia:  - Hgb A1c 5.5 - moderate SSI  - CBG today 130-150s with 9 units insulin total today. Given discharging with no steroid, will not dose insulin at home and will recommend pt checks blood sugar at PCP f/u.  # Sleep apnea:  - CPAP per RT while hospitalized   # FEN/GI:  - carb modified diet - SLIV  - colace BID for constipation   # Prophylaxis:  - heparin SQ   # Dispo:  - Being discharged today after last steroid dose. - Pt has called [facility] and will try to get appt within 2 weeks. - Pt has PCP appt scheduled 6/26. PCP to f/u blood sugar, improvement in speech vs need for  outpatient SLP,   # Code Status: discussed with patient upon admission, she desires to be full code   Simone Curia MD Family Medicine PGY-1 Family Medicine Service Pager: [phone] Text pages welcome at [email or web address] (login: [email or web address])

## 2013-03-19 NOTE — ED Provider Notes (Signed)
Medical screening examination/treatment/procedure(s) were conducted as a shared visit with non-physician practitioner(s) and myself.  I personally evaluated the patient during the encounter   Loren Racer, MD 03/19/13 [phone]

## 2013-03-20 NOTE — Progress Notes (Signed)
FMTS Attending Daily Note: Kesley Gaffey MD [phone] pager office [phone] I  have seen and examined this patient, reviewed their chart. I have discussed this patient with the resident. I agree with the resident's findings, assessment and care plan. 

## 2013-03-20 NOTE — Discharge Summary (Signed)
Family Medicine Teaching [facility] Discharge Summary  Patient name: [name] [facility] Medical record number: [id number] Date of birth: 1971/02/15 Age: 42 y.o. Gender: female Date of Admission: 03/15/2013  Date of Discharge: 03/18/2013 Admitting Physician: Janit Pagan, MD  Primary Care Provider: Charlette Caffey, MD  Indication for Hospitalization: MS flare  Discharge Diagnoses:  1. MS flare 2. Depression 3. Chronic pain 4. Hypothyroidism 5. Hyperglycemia 6. Sleep apnea 7. Obesity  Brief Hospital Course:  [name] is a 42 y.o. year old female who was hospitalized after presenting with weakness and speech difficulty from an MS flare.   # MS Flare: Neurology was consulted and recommended IV decadron 500mg  BID x 6 doses. We continued pt's home gilenya and other MS drugs. Pt's symptoms improved with administration of IV steroids. PT evaluated patient and recommended no follow up. OT recommended intermittent supervision. SLP recommended inpatient SLP care with potential need for outpatient f/u if not improved, but as pt did improve this was not indicated. Pt was discharged home with bariatric rollator, which will be shipped to pt's house this week. Pt's mother is involved in her care at home.  # Psych: continued pt's home prozac and xanax prn.  # Chronic pain: This was stable. Continued home morphine with prn percocet, also continued lyrica and mobic.   # Hypothyroidism:  TSH was checked and was low at 0.138. We decided to defer change in synthroid dose to PCP as an outpatient.   # Hyperglycemia: A1c was checked and was 5.5. Pt was given SSI while hospitalized due to hyperglycemia from IV steroids. Recommend rechecking CBG upon f/u.   # Sleep apnea: Given CPAP per RT while hospitalized  Consultations: neurology  Procedures: none  Significant Labs and Imaging:   CMET  Lab  03/15/13 1610  03/16/13 0600   NA  137  138   K  4.1  4.2   CL  101  102    CO2  28  27   BUN  11  14   CREATININE  0.65  0.69   GLUCOSE  134*  195*   CALCIUM  9.2  9.1   AST  20  --   ALT  12  --   ALKPHOS  100  --   PROT  7.3  --   ALBUMIN  3.8  --     CBC  Lab  03/15/13 1610  03/16/13 0600   WBC  10.7*  8.7   HGB  12.1  12.1   HCT  36.6  36.6   PLT  282  283    TSH 0.138 A1c 5.5  Discharge Medications:    Medication List    STOP taking these medications       predniSONE 10 MG tablet  Commonly known as:  DELTASONE      TAKE these medications       ALPRAZolam 0.25 MG tablet  Commonly known as:  XANAX  Take 0.25 mg by mouth daily.     baclofen 10 MG tablet  Commonly known as:  LIORESAL  Take 20 mg by mouth 3 (three) times daily.     cetirizine 10 MG tablet  Commonly known as:  ZYRTEC  Take 10 mg by mouth daily.     docusate sodium 100 MG capsule  Commonly known as:  COLACE  Take 200 mg by mouth 2 (two) times daily.     Fingolimod HCl 0.5 MG Caps  Commonly known as:  GILENYA  Take 1 capsule (0.5 mg total) by mouth daily.     FLUoxetine 20 MG capsule  Commonly known as:  PROZAC  Take 20 mg by mouth daily.     fluticasone 50 MCG/ACT nasal spray  Commonly known as:  FLONASE  Place 2 sprays into the nose daily.     levothyroxine 75 MCG tablet  Commonly known as:  SYNTHROID, LEVOTHROID  Take 100 mcg by mouth daily.     Magnesium Oxide 400 (240 MG) MG Tabs  Take 40 mg by mouth daily.     meloxicam 7.5 MG tablet  Commonly known as:  MOBIC  Take 7.5 mg by mouth daily.     modafinil 200 MG tablet  Commonly known as:  PROVIGIL  Take 200 mg by mouth daily.     morphine 30 MG 24 hr capsule  Commonly known as:  KADIAN  Take 1 capsule (30 mg total) by mouth every morning.     morphine 50 MG 24 hr capsule  Commonly known as:  KADIAN  Take 1 capsule (50 mg total) by mouth every evening.     oxyCODONE-acetaminophen 5-325 MG per tablet  Commonly known as:  PERCOCET  Take 1 tablet by mouth 2 (two) times daily.      pantoprazole 40 MG tablet  Commonly known as:  PROTONIX  Take 1 tablet (40 mg total) by mouth daily.     polyethylene glycol packet  Commonly known as:  MIRALAX / GLYCOLAX  Take 17 g by mouth daily.     pregabalin 75 MG capsule  Commonly known as:  LYRICA  Take 75 mg by mouth 2 (two) times daily.     PRENATAL VITAMIN PO  Take by mouth. Patient not pregnant these work better for her.        Issues for Follow Up:  - Pt has called [facility] and will try to get appt within 2 weeks - needs f/u for MS flare - Pt has PCP appt scheduled 6/26. PCP to f/u blood sugar, improvement in speech vs need for outpatient SLP - would consider decrease in synthroid dose given low TSH   Outstanding Results: none      Follow-up Information   Follow up with Charlette Caffey, MD On 03/26/2013. (at 9:30 AM for hospital follow up)    Contact information:   [address] [place] [address] [phone]       Follow up with [facility]. Schedule an appointment as soon as possible for a visit in 2 weeks. (For a hospital follow up appointment for an MS flare)    Contact information:   [address] [place] [address] [phone]      Discharge Condition: Stable  Discharge Disposition: Home  Discharge Instructions: Please refer to Patient Instructions section of EMR for full details.  Patient was counseled important signs and symptoms that should prompt return to medical care, changes in medications, dietary instructions, activity restrictions, and follow up appointments.    Levert Feinstein, MD Family Medicine PGY-1

## 2013-03-21 NOTE — Discharge Summary (Signed)
Family Medicine Teaching Service  Discharge Note : Attending Delmus Warwick MD Pager [phone] Office [phone] I have seen and examined this patient, reviewed their chart and discussed discharge planning wit the resident at the time of discharge. I agree with the discharge plan as above.  

## 2013-04-06 ENCOUNTER — Ambulatory Visit (INDEPENDENT_AMBULATORY_CARE_PROVIDER_SITE_OTHER): Payer: Medicare PPO | Admitting: Neurology

## 2013-04-06 ENCOUNTER — Encounter: Payer: Self-pay | Admitting: Neurology

## 2013-04-06 VITALS — BP 134/90 | HR 82 | Wt 323.0 lb

## 2013-04-06 DIAGNOSIS — G35 Multiple sclerosis: Secondary | ICD-10-CM

## 2013-04-06 NOTE — Progress Notes (Signed)
History of Present Illness:  [name] is a 42 year old right-handed African American single female from [place], [place], she was diagnosed with multiple sclerosis 10/2004 with positive MRI of the brain and  6 oligoclonal bands in CSF.  She was patient of Dr. Sandria Manly.  She was begun on Avonex. 03/12/2006 she had  new cervical medullary lesion and was changed to rebif. She is in the EPOC study with Gilenya and began the  medication 05/24/10. She is not having any side effects from the medication. She had a normal examination at the [facility] specialists clinic checking for macular edema on 08/17/10. She denies side effects from the medication.  She denies Lhermitte's sign or bowel or bladder dysfunction. She has  lower back pain and right hip pain that extends to her right upper thigh. It occurs intermittently. She has pain in the muscles of her arms and legs.     She has thyroid disease and thyroid nodules and was followed by Dr.Kerr, endocrinologist but now by Dr. Louis Meckel who indicates that she  has diabetes mellitus. Anti thyroglobulin and thyro peroxidase antibodies have been negative. 05/29/2011 CMP and CBC were normal except for white blood cell count 3800 and hemoglobin 11.5.glucose was 106.   06/25/11 MRI of the brain and cervical spine showed a few scattered supratentorial nonspecific foci of T2 hyperintensity without enhancement and no change versus 04/19/09. There was disc bulging at C5-6 and C6-7 without cord lesions present and no change versus 06/16/08.   She complains of lower back pain which is not radiating to her legs. She is on Kadian 30 mg XR 24 one in the morning and Kadian 50 mg XR one in the evening for fibromyalgia pain and leg pain.  She is hesitant to  increase Lyrica 75 mg twice a day because of weight gain. She is on baclofen 20 mg twice a day.    She has numbness in the right anterior thigh which may be lumbar radiculopathy versus meralgia paresthetica. Her bowel and  bladder function are normal.    She is independent in her activities of daily living. She has had recurrence of pain in her back and right hip down the right leg.This is made worse by walking and decreased with sitting down.  She rates it  6/10.It is also made worse by cold air. Thursday 11/08/11 she felt better but was overtired. She noticed her right leg dragging. 11/13/11, she stood up and noted a lightheaded sensation and hot flash. She noted increased numbness in her legs  She developed foreign accent syndrome in 2012.  A course of 3 days of IV Solu-Medrol followed by po  prednisone was effective. This was her first attacks since she had begun Gilenya.   She is using CPAP At 13 cm of water with ESS 3. She had  pulmonary function tests because of decreased breath sounds  which were normal.     07/08/2012 she awoke with nausea and  shaking of her right hand and arm, and  inability to speak. Her right leg "did not want to go".  The following day her speech was hesitant  but well-formed. She was treated by iv followed by po steroid.    UPDATE May 23rd, 2014, last clinical visit is Oct 23, 2012 by Dr. Sandria Manly She complains of bilateral leg hot, burning sensation, right leg pain from hip to foot,  She complains of diffuse body achy pain, She is still taking Gilenya, both legs and feet hurt burning sensation,  especially at night time, on chronic morphine,  UPDATE June 5th 2014: Since her last visit in May 20 third, she had multiple phone call to our office, with constellation of complains, including increased bilateral lower extremity tremor, gait difficulty, continued language difficulty, she is taking baclofen 20 mg 3 times a day, Xanax 0. 2 5 mg twice a day per instruction of on-call physician.    UPDATE July 7th 2014 She presented  with progressive weakness of left lower extremity and difficulty with walking as well as progression of tremor affecting right extremities more than left extremities and  also affecting her speech.  She was admitted for a course of Solu-Medrol intravenously, 500 mg every 12 hours x6 from June 16th to June 18th, which has been very helpful. She wants home physical therapy  MRI of brain showed few periventricular and subcortical T2 hyperintensities. Some of these are hypointense on T1 views. No abnormal lesions are seen on post contrast views   Review of Systems  Out of a complete 14 system review, the patient complains of only the following symptoms, and all other reviewed systems are negative.   Constitutional:   Fatigue, obese Cardiovascular:  N/A Ear/Nose/Throat:  N/A Skin: N/A Eyes: N/A Respiratory: N/A Gastroitestinal: N/A    Hematology/Lymphatic:  N/A Endocrine:  Feeling hot, cold Musculoskeletal: aching muscles Allergy/Immunology: allergy Neurological: weakness, insominia, tremor Psychiatric:    N/A  Neurologic Exam  Mental Status: Alert and oriented to time, place, and person.  Speaks with hesitant speech and foreign accent syndrome  Cranial Nerves: Visual fields are full to count fingers examination. Extraocular movements full. Visual field is full on confrontational test.  Hearing intact.  There was no facial asymmetry.  Tongue midline, uvula midline, and gags present.   Tremor in her voice. Sternocleidomastoid and trapezius testing normal. No ocular dysmetria Motor: 5/5 strength proximally and distally in the upper and lower extremities.  No evidence of proximal, pronator, or distal drift.  No focal atrophy and no fasciculations seen.   Sensory: Intact to pinprick, light touch, joint position, and vibration sensation. Coordination:  Outstretched hand and arm tremor.  Intact finger-to-nose, heel-to-shin, and rapid alternating movements.  No evidence of rebound. Gait and Station:  she get up from chair by pushing on chair arm, relies on her cane, cautious, Deep tendon reflex, hypoactive and symmetric   Assessement and Plan: 42 yo with  relapsing remitting multiple sclerosis, on Gilenya treatment, also with chronic diffuse body achy pain, fibromyalgia, Diabetes mellitus, Thyroid nodule, Foreign Accent syndrome, minimum abnormality on MRI of the brain,   1. Keep Gilenya.  2. Home physical therapy.

## 2013-05-25 ENCOUNTER — Other Ambulatory Visit: Payer: Self-pay

## 2013-05-25 MED ORDER — MODAFINIL 200 MG PO TABS: 200.0000 mg | ORAL_TABLET | Freq: Every day | ORAL | Status: DC

## 2013-05-25 NOTE — Telephone Encounter (Signed)
Rx signed and faxed.

## 2013-05-25 NOTE — Telephone Encounter (Signed)
Former [facility] patient assigned to Dr [name].  She is not in the office today, forwarding request to Dr Anne Hahn, [facility]

## 2013-06-02 ENCOUNTER — Other Ambulatory Visit: Payer: Self-pay

## 2013-06-02 MED ORDER — BACLOFEN 20 MG PO TABS: 20.0000 mg | ORAL_TABLET | Freq: Two times a day (BID) | ORAL | Status: DC

## 2013-06-05 ENCOUNTER — Other Ambulatory Visit: Payer: Self-pay

## 2013-06-05 MED ORDER — ALPRAZOLAM 0.25 MG PO TABS: 0.2500 mg | ORAL_TABLET | Freq: Two times a day (BID) | ORAL | Status: DC

## 2013-06-09 NOTE — Telephone Encounter (Signed)
Rx signed and faxed.

## 2013-06-25 ENCOUNTER — Other Ambulatory Visit: Payer: Self-pay

## 2013-06-25 MED ORDER — FLUOXETINE HCL 20 MG PO CAPS: 20.0000 mg | ORAL_CAPSULE | Freq: Every day | ORAL | Status: DC

## 2013-07-17 ENCOUNTER — Other Ambulatory Visit: Payer: Self-pay

## 2013-07-20 MED ORDER — PREGABALIN 75 MG PO CAPS: 75.0000 mg | ORAL_CAPSULE | Freq: Two times a day (BID) | ORAL | Status: DC

## 2013-07-21 NOTE — Telephone Encounter (Signed)
Rx signed and faxed.

## 2013-08-06 ENCOUNTER — Other Ambulatory Visit: Payer: Self-pay

## 2013-08-19 ENCOUNTER — Other Ambulatory Visit: Payer: Self-pay | Admitting: Neurology

## 2013-08-20 NOTE — Telephone Encounter (Signed)
Prescribing Provider Encounter Provider   Levert Feinstein, MD Lucille Passy, CPHT         Medication Detail      Disp Refills Start End     baclofen (LIORESAL) 20 MG tablet 180 each 0 06/02/2013     Sig - Route: Take 1 tablet (20 mg total) by mouth 2 (two) times daily. - Oral    E-Prescribing Status: Receipt confirmed by pharmacy (06/02/2013 4:33 PM EDT)

## 2013-08-22 ENCOUNTER — Emergency Department (HOSPITAL_BASED_OUTPATIENT_CLINIC_OR_DEPARTMENT_OTHER)
Admission: EM | Admit: 2013-08-22 | Discharge: 2013-08-23 | Disposition: A | Discharge status: Home / Self Care | Payer: Medicare PPO | Attending: Emergency Medicine | Admitting: Emergency Medicine

## 2013-08-22 ENCOUNTER — Encounter (HOSPITAL_BASED_OUTPATIENT_CLINIC_OR_DEPARTMENT_OTHER): Payer: Self-pay | Admitting: Emergency Medicine

## 2013-08-22 DIAGNOSIS — R1013 Epigastric pain: Secondary | ICD-10-CM | POA: Insufficient documentation

## 2013-08-22 DIAGNOSIS — G8929 Other chronic pain: Secondary | ICD-10-CM | POA: Insufficient documentation

## 2013-08-22 DIAGNOSIS — J3489 Other specified disorders of nose and nasal sinuses: Secondary | ICD-10-CM | POA: Insufficient documentation

## 2013-08-22 DIAGNOSIS — R112 Nausea with vomiting, unspecified: Secondary | ICD-10-CM | POA: Insufficient documentation

## 2013-08-22 DIAGNOSIS — Z79899 Other long term (current) drug therapy: Secondary | ICD-10-CM | POA: Insufficient documentation

## 2013-08-22 DIAGNOSIS — Z87891 Personal history of nicotine dependence: Secondary | ICD-10-CM | POA: Insufficient documentation

## 2013-08-22 DIAGNOSIS — Z791 Long term (current) use of non-steroidal anti-inflammatories (NSAID): Secondary | ICD-10-CM | POA: Insufficient documentation

## 2013-08-22 DIAGNOSIS — R630 Anorexia: Secondary | ICD-10-CM | POA: Insufficient documentation

## 2013-08-22 DIAGNOSIS — IMO0001 Reserved for inherently not codable concepts without codable children: Secondary | ICD-10-CM | POA: Insufficient documentation

## 2013-08-22 DIAGNOSIS — K219 Gastro-esophageal reflux disease without esophagitis: Secondary | ICD-10-CM | POA: Insufficient documentation

## 2013-08-22 DIAGNOSIS — IMO0002 Reserved for concepts with insufficient information to code with codable children: Secondary | ICD-10-CM | POA: Insufficient documentation

## 2013-08-22 DIAGNOSIS — Z8711 Personal history of peptic ulcer disease: Secondary | ICD-10-CM | POA: Insufficient documentation

## 2013-08-22 DIAGNOSIS — E119 Type 2 diabetes mellitus without complications: Secondary | ICD-10-CM | POA: Insufficient documentation

## 2013-08-22 DIAGNOSIS — K5900 Constipation, unspecified: Secondary | ICD-10-CM | POA: Insufficient documentation

## 2013-08-22 DIAGNOSIS — E079 Disorder of thyroid, unspecified: Secondary | ICD-10-CM | POA: Insufficient documentation

## 2013-08-22 DIAGNOSIS — R0982 Postnasal drip: Secondary | ICD-10-CM | POA: Insufficient documentation

## 2013-08-22 HISTORY — DX: Gastro-esophageal reflux disease without esophagitis: K21.9

## 2013-08-22 LAB — CBC WITH DIFFERENTIAL/PLATELET
Basophils Relative: 0 % (ref 0–1)
Eosinophils Absolute: 0 10*3/uL (ref 0.0–0.7)
Eosinophils Relative: 0 % (ref 0–5)
HCT: 31.9 % — ABNORMAL LOW (ref 36.0–46.0)
Hemoglobin: 10 g/dL — ABNORMAL LOW (ref 12.0–15.0)
Lymphs Abs: 0.3 10*3/uL — ABNORMAL LOW (ref 0.7–4.0)
MCH: 26.2 pg (ref 26.0–34.0)
MCHC: 31.3 g/dL (ref 30.0–36.0)
MCV: 83.5 fL (ref 78.0–100.0)
Monocytes Absolute: 0.8 10*3/uL (ref 0.1–1.0)
Monocytes Relative: 9 % (ref 3–12)
Neutro Abs: 8 10*3/uL — ABNORMAL HIGH (ref 1.7–7.7)
Neutrophils Relative %: 87 % — ABNORMAL HIGH (ref 43–77)
RBC: 3.82 MIL/uL — ABNORMAL LOW (ref 3.87–5.11)
RDW: 13.5 % (ref 11.5–15.5)

## 2013-08-22 LAB — URINALYSIS, ROUTINE W REFLEX MICROSCOPIC
Bilirubin Urine: NEGATIVE
Glucose, UA: NEGATIVE mg/dL
Ketones, ur: NEGATIVE mg/dL
Leukocytes, UA: NEGATIVE
Nitrite: NEGATIVE
Specific Gravity, Urine: 1.021 (ref 1.005–1.030)
Urobilinogen, UA: 1 mg/dL (ref 0.0–1.0)

## 2013-08-22 LAB — COMPREHENSIVE METABOLIC PANEL
ALT: 13 U/L (ref 0–35)
Alkaline Phosphatase: 70 U/L (ref 39–117)
BUN: 14 mg/dL (ref 6–23)
Calcium: 8.8 mg/dL (ref 8.4–10.5)
Creatinine, Ser: 0.9 mg/dL (ref 0.50–1.10)
GFR calc Af Amer: >90 mL/min (ref >90)
Glucose, Bld: 133 mg/dL — ABNORMAL HIGH (ref 70–99)
Potassium: 3.9 mEq/L (ref 3.5–5.1)
Sodium: 141 mEq/L (ref 135–145)
Total Protein: 6.6 g/dL (ref 6.0–8.3)

## 2013-08-22 LAB — LIPASE, BLOOD: Lipase: 29 U/L (ref 11–59)

## 2013-08-22 MED ORDER — SODIUM CHLORIDE 0.9 % IV BOLUS (SEPSIS)
1000.0000 mL | Freq: Once | INTRAVENOUS | Status: AC
  Administered 2013-08-22: 1000 mL via INTRAVENOUS

## 2013-08-22 MED ORDER — ONDANSETRON HCL 4 MG/2ML IJ SOLN
4.0000 mg | Freq: Once | INTRAMUSCULAR | Status: AC
  Administered 2013-08-22: 4 mg via INTRAVENOUS
  Filled 2013-08-22: qty 2

## 2013-08-22 MED ORDER — PROMETHAZINE HCL 25 MG PO TABS: 25.0000 mg | ORAL_TABLET | Freq: Four times a day (QID) | ORAL | Status: DC | PRN

## 2013-08-22 MED ORDER — PROMETHAZINE HCL 25 MG/ML IJ SOLN
12.5000 mg | Freq: Once | INTRAMUSCULAR | Status: AC
  Administered 2013-08-22: 12.5 mg via INTRAVENOUS
  Filled 2013-08-22: qty 1

## 2013-08-22 MED ORDER — HYDROMORPHONE HCL PF 1 MG/ML IJ SOLN
1.0000 mg | Freq: Once | INTRAMUSCULAR | Status: AC
  Administered 2013-08-22: 1 mg via INTRAVENOUS
  Filled 2013-08-22: qty 1

## 2013-08-22 NOTE — ED Notes (Signed)
Pt states she has been vomiting since Thursday- went to [facility] on Friday and was given steroids and antibiotics for ear and sinus infection- reports she has vomited x 3 today

## 2013-08-22 NOTE — ED Notes (Signed)
Chaperoned PA rectal exam for occult fecal blood test.

## 2013-08-22 NOTE — ED Notes (Signed)
Patient is aware that a UA is needed.

## 2013-08-22 NOTE — ED Provider Notes (Signed)
CSN: [id number]     Arrival date & time 08/22/13  2027 History   First MD Initiated Contact with Patient 08/22/13 2050     Chief Complaint  Patient presents with  . Emesis   (Consider location/radiation/quality/duration/timing/severity/associated sxs/prior Treatment) Patient is a 41 y.o. female presenting with vomiting. The history is provided by the patient.  Emesis Associated symptoms: abdominal pain   Associated symptoms: no diarrhea   Patient presents with nausea and vomiting x 3 days. Patient states that she has not been able to keep anything down. States that she has run out of her Phenergan at home. Patient's PMH is significant for PUD, Chronic back pain, and chronic use of NSAIDs. Patient admits to epigastric pain. Describes pain as sharp, rated at 9/10. Denies hematemesis, melena, and hematochezia. Denies Fever and Chills. Patient had 3 episodes of vomiting today. Last bowl movement was Thursday. Seen Friday by PCP for sinus infection and received an injection of steroids and antibiotics, and given a z-pak. Has not yet started z-pak, due to vomiting.     Past Medical History  Diagnosis Date  . MS (multiple sclerosis)   . Thyroid disease   . Fibromyalgia   . Sleep apnea   . Acid reflux   . Diabetes mellitus without complication     states she was "prediabetic" at one time   Past Surgical History  Procedure Laterality Date  . Hernia repair    . Endometrial biopsy     Family History  Problem Relation Age of Onset  . Hypertension Mother   . High blood pressure Father   . Migraines Mother   . Lung cancer Father    History  Substance Use Topics  . Smoking status: Former Smoker    Quit date: 10/01/1988  . Smokeless tobacco: Never Used  . Alcohol Use: No     Comment: Quit alcohol consumption in 1990's   OB History   Grav Para Term Preterm Abortions TAB SAB Ect Mult Living                 Review of Systems  Constitutional: Positive for appetite change. Negative for  fever.  HENT: Positive for congestion and postnasal drip.   Respiratory: Negative for cough, chest tightness and shortness of breath.   Cardiovascular: Negative for chest pain.  Gastrointestinal: Positive for nausea, vomiting, abdominal pain and constipation. Negative for diarrhea, blood in stool and abdominal distention.  Musculoskeletal: Positive for back pain (chronic ).  All other systems reviewed and are negative.    Allergies  Review of patient's allergies indicates no known allergies.  Home Medications   Current Outpatient Rx  Name  Route  Sig  Dispense  Refill  . ALPRAZolam (XANAX) 0.25 MG tablet   Oral   Take 1 tablet (0.25 mg total) by mouth 2 (two) times daily.   60 tablet   5     Pharmacy Fax [phone]   . baclofen (LIORESAL) 20 MG tablet   Oral   Take 1 tablet (20 mg total) by mouth 2 (two) times daily.   180 each   0   . cetirizine (ZYRTEC) 10 MG tablet   Oral   Take 10 mg by mouth daily.         Marland Kitchen docusate sodium (COLACE) 100 MG capsule   Oral   Take 200 mg by mouth 2 (two) times daily.         . Fingolimod HCl (GILENYA) 0.5 MG CAPS   Oral  Take 1 capsule (0.5 mg total) by mouth daily.   90 capsule   3   . FLUoxetine (PROZAC) 20 MG capsule   Oral   Take 1 capsule (20 mg total) by mouth daily.   90 capsule   1   . fluticasone (FLONASE) 50 MCG/ACT nasal spray   Nasal   Place 2 sprays into the nose daily.         Marland Kitchen levothyroxine (SYNTHROID, LEVOTHROID) 75 MCG tablet   Oral   Take 100 mcg by mouth daily.          . meloxicam (MOBIC) 7.5 MG tablet   Oral   Take 7.5 mg by mouth daily.         . modafinil (PROVIGIL) 200 MG tablet   Oral   Take 1 tablet (200 mg total) by mouth daily.   30 tablet   5     Pharmacy Fax [phone]   . morphine (KADIAN) 30 MG 24 hr capsule   Oral   Take 1 capsule (30 mg total) by mouth every morning.   30 capsule   0     Mail   . morphine (KADIAN) 50 MG 24 hr capsule   Oral   Take 1 capsule  (50 mg total) by mouth every evening.   30 capsule   0     Mail   . oxyCODONE-acetaminophen (PERCOCET) 5-325 MG per tablet   Oral   Take 1 tablet by mouth 2 (two) times daily.   60 tablet   0     Mail   . pantoprazole (PROTONIX) 40 MG tablet   Oral   Take 1 tablet (40 mg total) by mouth daily.   15 tablet   0   . polyethylene glycol (MIRALAX / GLYCOLAX) packet   Oral   Take 17 g by mouth as needed.         . pravastatin (PRAVACHOL) 40 MG tablet   Oral   Take 40 mg by mouth daily.         . pregabalin (LYRICA) 75 MG capsule   Oral   Take 1 capsule (75 mg total) by mouth 2 (two) times daily.   60 capsule   5   . Prenatal Vit-Fe Sulfate-FA (PRENATAL VITAMIN PO)   Oral   Take by mouth. Patient not pregnant these work better for her.         . promethazine (PHENERGAN) 25 MG tablet   Oral   Take 1 tablet (25 mg total) by mouth every 6 (six) hours as needed for nausea or vomiting.   15 tablet   0    BP 149/88  Pulse 69  Temp(Src) 98.2 F (36.8 C) (Oral)  Resp 20  Ht 5\' 4"  (1.626 m)  Wt 326 lb (147.873 kg)  BMI 55.93 kg/m2  SpO2 100% Physical Exam  Constitutional: She is oriented to person, place, and time. She appears well-developed and well-nourished. She appears distressed.  HENT:  Head: Normocephalic and atraumatic.  Eyes: No scleral icterus.  Neck: Normal range of motion. Neck supple.  Cardiovascular: Normal rate and regular rhythm.  Exam reveals no gallop and no friction rub.   No murmur heard. Pulmonary/Chest: Effort normal. No respiratory distress. She has no wheezes. She has no rales.  Abdominal: Soft. She exhibits no distension and no mass. Bowel sounds are decreased. There is tenderness in the epigastric area. There is no rigidity, no rebound, no guarding, no tenderness at McBurney's point and negative  Murphy's sign.  Musculoskeletal: Normal range of motion.  Neurological: She is alert and oriented to person, place, and time. She has normal  strength.  Skin: Skin is warm and dry. She is not diaphoretic. No pallor.  Psychiatric: She has a normal mood and affect. Her speech is normal and behavior is normal.    ED Course  Procedures (including critical care time) Labs Review Labs Reviewed  CBC WITH DIFFERENTIAL - Abnormal; Notable for the following:    RBC 3.82 (*)    Hemoglobin 10.0 (*)    HCT 31.9 (*)    Neutrophils Relative % 87 (*)    Neutro Abs 8.0 (*)    Lymphocytes Relative 4 (*)    Lymphs Abs 0.3 (*)    All other components within normal limits  COMPREHENSIVE METABOLIC PANEL - Abnormal; Notable for the following:    Glucose, Bld 133 (*)    GFR calc non Af Amer 78 (*)    All other components within normal limits  LIPASE, BLOOD  URINALYSIS, ROUTINE W REFLEX MICROSCOPIC  OCCULT BLOOD X 1 CARD TO LAB, STOOL   Imaging Review No results found.  EKG Interpretation   None       MDM   1. Nausea & vomiting   Patient c/o nausea and vomiting with associated epigastric pain. Lipase WNL. UA WNL Electrolytes WNL. Stool guiac negative. Patient given IV fluid bolus. Treatment for nausea and pain made patient more comfortable. Patient discharged with rx for Phenergan for nausea. Discussed need to follow up with PCP. Patient given copy of lab work to take to PCP. Advised to return if symptoms should return or worsen.     Rudene Anda, PA-C 08/23/13 [phone]

## 2013-08-23 NOTE — ED Provider Notes (Signed)
Medical screening examination/treatment/procedure(s) were performed by non-physician practitioner and as supervising physician I was immediately available for consultation/collaboration.  EKG Interpretation   None         Charles B. Bernette Mayers, MD 08/23/13 1416

## 2013-08-24 ENCOUNTER — Encounter: Payer: Self-pay | Admitting: Nurse Practitioner

## 2013-08-24 ENCOUNTER — Ambulatory Visit (INDEPENDENT_AMBULATORY_CARE_PROVIDER_SITE_OTHER): Payer: Medicare PPO | Admitting: Nurse Practitioner

## 2013-08-24 VITALS — BP 131/69 | HR 73 | Ht 65.0 in | Wt 325.0 lb

## 2013-08-24 DIAGNOSIS — G35 Multiple sclerosis: Secondary | ICD-10-CM

## 2013-08-24 DIAGNOSIS — IMO0002 Reserved for concepts with insufficient information to code with codable children: Secondary | ICD-10-CM

## 2013-08-24 DIAGNOSIS — R269 Unspecified abnormalities of gait and mobility: Secondary | ICD-10-CM

## 2013-08-24 MED ORDER — PANTOPRAZOLE SODIUM 40 MG PO TBEC: 40.0000 mg | DELAYED_RELEASE_TABLET | Freq: Every day | ORAL | Status: DC

## 2013-08-24 NOTE — Progress Notes (Signed)
[facility]  PATIENT: [name] DOB: [birth year]   REASON FOR VISIT: Followup for multiple sclerosis and gait disorder   HISTORY OF PRESENT ILLNESS: [name] returns for followup after her last visit with Dr. [name] 7/7/ 2014. He has a history of relapsing remitting multiple sclerosis and is currently on gilenya. She does have occasional stomach upset and takes Protonix. She was in the emergency room over the weekend with GI upset. Labs were reviewed. She had some physical therapy after her last visit which was very beneficial and she continues to do her home exercise program. She states "I'm  doing well at present". She receives her pain medication from her primary care at [facility] in [place], [place]. She denies any recent falls, spasticity, or blurred vision  or double vision, loss of bowel or bladder control. She does have fatigue for which she takes Provigil. No new complaints. Most recent MRI was in May 2014.   HISTORY:[name] is a 42 year old right-handed [place] American single female from [place], [place], she was diagnosed with multiple sclerosis 10/2004 with positive MRI of the brain and 6 oligoclonal bands in CSF. She was patient of Dr. Sandria Manly.  She was begun on Avonex. 03/12/2006 she had new cervical medullary lesion and was changed to rebif. She is in the EPOC study with Gilenya and began the medication 05/24/10. She is not having any side effects from the medication. She had a normal examination at the [facility] specialists clinic checking for macular edema on 08/17/10. She denies side effects from the medication.  She denies Lhermitte's sign or bowel or bladder dysfunction. She has lower back pain and right hip pain that extends to her right upper thigh. It occurs intermittently. She has pain in the muscles of her arms and legs.  She has thyroid disease and thyroid nodules and was followed by Dr.Kerr, endocrinologist but now by Dr.  Louis Meckel who indicates that she has diabetes mellitus. Anti thyroglobulin and thyro peroxidase antibodies have been negative. 05/29/2011 CMP and CBC were normal except for white blood cell count 3800 and hemoglobin 11.5.glucose was 106.  06/25/11 MRI of the brain and cervical spine showed a few scattered supratentorial nonspecific foci of T2 hyperintensity without enhancement and no change versus 04/19/09. There was disc bulging at C5-6 and C6-7 without cord lesions present and no change versus 06/16/08.  She complains of lower back pain which is not radiating to her legs. She is on Kadian 30 mg XR 24 one in the morning and Kadian 50 mg XR one in the evening for fibromyalgia pain and leg pain. She is hesitant to increase Lyrica 75 mg twice a day because of weight gain. She is on baclofen 20 mg twice a day.  She has numbness in the right anterior thigh which may be lumbar radiculopathy versus meralgia paresthetica. Her bowel and bladder function are normal.  She is independent in her activities of daily living. She has had recurrence of pain in her back and right hip down the right leg.This is made worse by walking and decreased with sitting down. She rates it 6/10.It is also made worse by cold air. Thursday 11/08/11 she felt better but was overtired. She noticed her right leg dragging. 11/13/11, she stood up and noted a lightheaded sensation and hot flash. She noted increased numbness in her legs  She developed foreign accent syndrome in 2012. A course of 3 days of IV Solu-Medrol followed by po prednisone was effective. This  was her first attacks since she had begun Gilenya.  She is using CPAP At 13 cm of water with ESS 3. She had pulmonary function tests because of decreased breath sounds which were normal.  07/08/2012 she awoke with nausea and shaking of her right hand and arm, and inability to speak. Her right leg "did not want to go". The following day her speech was hesitant but well-formed. She was treated by  iv followed by po steroid.  UPDATE May 23rd, 2014, last clinical visit is Oct 23, 2012 by Dr. Sandria Manly  She complains of bilateral leg hot, burning sensation, right leg pain from hip to foot, She complains of diffuse body achy pain, She is still taking Gilenya, both legs and feet hurt burning sensation, especially at night time, on chronic morphine,  UPDATE June 5th 2014:  Since her last visit in May 20 third, she had multiple phone call to our office, with constellation of complains, including increased bilateral lower extremity tremor, gait difficulty, continued language difficulty, she is taking baclofen 20 mg 3 times a day, Xanax 0. 2 5 mg twice a day per instruction of on-call physician.  UPDATE July 7th 2014  She presented with progressive weakness of left lower extremity and difficulty with walking as well as progression of tremor affecting right extremities more than left extremities and also affecting her speech. She was admitted for a course of Solu-Medrol intravenously, 500 mg every 12 hours x6 from June 16th to June 18th, which has been very helpful. She wants home physical therapy  MRI of brain showed few periventricular and subcortical T2 hyperintensities. Some of these are hypointense on T1 views. No abnormal lesions are seen on post contrast views   REVIEW OF SYSTEMS: Full 14 system review of systems performed and notable only for:  Constitutional: Fatigue  Cardiovascular: Swelling in the legs  Ear/Nose/Throat: N/A  Skin: N/A  Eyes: N/A  Respiratory: N/A  Gastroitestinal: N/A  Hematology/Lymphatic: N/A  Endocrine: N/A Musculoskeletal: Achy muscles  Allergy/Immunology: Allergies  Neurological: N/A Psychiatric: Anxiety  ALLERGIES: No Known Allergies  HOME MEDICATIONS: Outpatient Prescriptions Prior to Visit  Medication Sig Dispense Refill  . ALPRAZolam (XANAX) 0.25 MG tablet Take 1 tablet (0.25 mg total) by mouth 2 (two) times daily.  60 tablet  5  . baclofen (LIORESAL) 20  MG tablet Take 1 tablet (20 mg total) by mouth 2 (two) times daily.  180 each  0  . cetirizine (ZYRTEC) 10 MG tablet Take 10 mg by mouth daily.      Marland Kitchen docusate sodium (COLACE) 100 MG capsule Take 200 mg by mouth 2 (two) times daily.      . Fingolimod HCl (GILENYA) 0.5 MG CAPS Take 1 capsule (0.5 mg total) by mouth daily.  90 capsule  3  . FLUoxetine (PROZAC) 20 MG capsule Take 1 capsule (20 mg total) by mouth daily.  90 capsule  1  . fluticasone (FLONASE) 50 MCG/ACT nasal spray Place 2 sprays into the nose daily.      Marland Kitchen levothyroxine (SYNTHROID, LEVOTHROID) 75 MCG tablet Take 100 mcg by mouth daily.       . meloxicam (MOBIC) 7.5 MG tablet Take 15 mg by mouth daily.       . modafinil (PROVIGIL) 200 MG tablet Take 1 tablet (200 mg total) by mouth daily.  30 tablet  5  . morphine (KADIAN) 50 MG 24 hr capsule Take 1 capsule (50 mg total) by mouth every evening.  30 capsule  0  .  oxyCODONE-acetaminophen (PERCOCET) 5-325 MG per tablet Take 1 tablet by mouth 2 (two) times daily.  60 tablet  0  . polyethylene glycol (MIRALAX / GLYCOLAX) packet Take 17 g by mouth as needed.      . pregabalin (LYRICA) 75 MG capsule Take 1 capsule (75 mg total) by mouth 2 (two) times daily.  60 capsule  5  . Prenatal Vit-Fe Sulfate-FA (PRENATAL VITAMIN PO) Take by mouth. Patient not pregnant these work better for her.      . promethazine (PHENERGAN) 25 MG tablet Take 1 tablet (25 mg total) by mouth every 6 (six) hours as needed for nausea or vomiting.  15 tablet  0  . morphine (KADIAN) 30 MG 24 hr capsule Take 1 capsule (30 mg total) by mouth every morning.  30 capsule  0  . pantoprazole (PROTONIX) 40 MG tablet Take 1 tablet (40 mg total) by mouth daily.  15 tablet  0  . pravastatin (PRAVACHOL) 40 MG tablet Take 40 mg by mouth daily.       No facility-administered medications prior to visit.    PAST MEDICAL HISTORY: Past Medical History  Diagnosis Date  . MS (multiple sclerosis)   . Thyroid disease   . Fibromyalgia     . Sleep apnea   . Acid reflux   . Diabetes mellitus without complication     states she was "prediabetic" at one time    PAST SURGICAL HISTORY: Past Surgical History  Procedure Laterality Date  . Hernia repair    . Endometrial biopsy      FAMILY HISTORY: Family History  Problem Relation Age of Onset  . Hypertension Mother   . High blood pressure Father   . Migraines Mother   . Lung cancer Father     SOCIAL HISTORY: History   Social History  . Marital Status: Divorced    Spouse Name: N/A    Number of Children: 1  . Years of Education: college   Occupational History  .      disabled   Social History Main Topics  . Smoking status: Former Smoker    Quit date: 10/01/1988  . Smokeless tobacco: Never Used  . Alcohol Use: No     Comment: Quit alcohol consumption in 1990's  . Drug Use: No  . Sexual Activity: Not on file   Other Topics Concern  . Not on file   Social History Narrative   She lives with her mother, daughter, she is on disability.    Right handed.   Caffeine soda 2 daily     PHYSICAL EXAM  Filed Vitals:   08/24/13 0935  BP: 131/69  Pulse: 73  Height: 5\' 5"  (1.651 m)  Weight: 325 lb (147.419 kg)   Body mass index is 54.08 kg/(m^2).  Generalized: Well developed, morbidly obese female in no acute distress  Head: normocephalic and atraumatic,. Oropharynx benign  Neck: Supple, no carotid bruits  Cardiac: Regular rate rhythm, no murmur  Musculoskeletal: No deformity   Neurological examination   Mentation: Alert oriented to time, place, history taking. Follows all commands speech is hesitant at times Cranial nerve II-XII: Visual acuity 20/30 bilaterally Pupils were equal round reactive to light extraocular movements were full, visual field were full on confrontational test. Facial sensation and strength were normal. hearing was intact to finger rubbing bilaterally. Uvula tongue midline. head turning and shoulder shrug and were normal and  symmetric.Tongue protrusion into cheek strength was normal. Tremor in her voice Motor: normal bulk and  tone, full strength in the BUE, BLE, fine finger movements normal, no pronator drift. No focal weakness Sensory: normal and symmetric to light touch, pinprick, and  vibration  Coordination: finger-nose-finger, heel-to-shin bilaterally, no dysmetria. Outstretched tremor of the hands Reflexes: Depressed and symmetric upper and lower Gait and Station: Rising up from seated position without assistance, wide based  stance,  moderate stride, good arm swing, smooth turning, able to perform tiptoe, and heel walking without difficulty. Tandem gait is steady, no assistive device  DIAGNOSTIC DATA (LABS, IMAGING, TESTING) - I reviewed patient records, labs, notes, testing and imaging myself where available.  Lab Results  Component Value Date   WBC 9.2 08/22/2013   HGB 10.0* 08/22/2013   HCT 31.9* 08/22/2013   MCV 83.5 08/22/2013   PLT 298 08/22/2013      Component Value Date/Time   NA 141 08/22/2013 2139   NA 141 03/05/2013 1520   K 3.9 08/22/2013 2139   CL 102 08/22/2013 2139   CO2 29 08/22/2013 2139   GLUCOSE 133* 08/22/2013 2139   GLUCOSE 90 03/05/2013 1520   BUN 14 08/22/2013 2139   BUN 12 03/05/2013 1520   CREATININE 0.90 08/22/2013 2139   CALCIUM 8.8 08/22/2013 2139   PROT 6.6 08/22/2013 2139   PROT 6.3 03/05/2013 1520   ALBUMIN 3.5 08/22/2013 2139   AST 19 08/22/2013 2139   ALT 13 08/22/2013 2139   ALKPHOS 70 08/22/2013 2139   BILITOT 0.3 08/22/2013 2139   GFRNONAA 78* 08/22/2013 2139   GFRAA >90 08/22/2013 2139    Lab Results  Component Value Date   HGBA1C 5.5 03/16/2013    Lab Results  Component Value Date   TSH 0.138* 03/16/2013      ASSESSMENT AND PLAN  42 y.o. year old female  has a past medical history of MS (multiple sclerosis); Thyroid disease; Fibromyalgia; Sleep apnea; Acid reflux; and Diabetes mellitus without complication. here to followup for multiple sclerosis.  She has had no recent exacerbations. She is currently on gilenya tolerating the medication without significant side effects  Labs reviewed.  Continue Gilenya Will refill protonix till seen by PCP F/U 6 months Nilda Riggs, [facility], [facility], APRN  [facility] [address], [address] [place], [place] [address] [phone]

## 2013-08-24 NOTE — Patient Instructions (Signed)
Labs reviewed.  Continue Gilenya Will refill protonix till seen by PCP F/U 6 months

## 2013-09-14 ENCOUNTER — Other Ambulatory Visit: Payer: Self-pay

## 2013-09-14 MED ORDER — BACLOFEN 20 MG PO TABS: 20.0000 mg | ORAL_TABLET | Freq: Two times a day (BID) | ORAL | Status: DC

## 2013-12-01 ENCOUNTER — Telehealth: Payer: Self-pay | Admitting: Nurse Practitioner

## 2013-12-01 NOTE — Telephone Encounter (Signed)
[name]:   please call patient Chart reviewed, she was seen in the office in the past, with constellation of complaints, including gait difficulty, language difficulties,  I do not feel comfortable treating her with any steroid unless I am sure she is truly having a flareups, because of potential long-term side effect associated with high-dose steroid treatment,  Please give her appointment with [name] in her next available before making any further decision. Also check to see if she has any signs of infection, such as fever, UTI, upper GI, respiratory infection, patient's MS symptoms can be much worse during active infection, if she does, she should see her primary care physician first,

## 2013-12-01 NOTE — Telephone Encounter (Signed)
Spoke with patient and she said that on Wednesday(25th), head started to feel like she was falling and she could not stop, it was like she was asleep but was not, laid her head on the bed but it did not help, speech difficulty, walking problems started on following Saturday. Thinks she may be relapsing, should she start steroids?

## 2013-12-01 NOTE — Telephone Encounter (Signed)
Patient is having difficulty walking and also having problems with her speech. . She would like a call back from a nurse regarding this [phone].

## 2013-12-02 NOTE — Telephone Encounter (Signed)
Spoke with patient and shared message from Dr [name], patient said that she is not having any symptoms of an active infection, scheduled / confirmed for first available with CM

## 2013-12-04 ENCOUNTER — Other Ambulatory Visit: Payer: Self-pay | Admitting: Nurse Practitioner

## 2013-12-04 ENCOUNTER — Ambulatory Visit (INDEPENDENT_AMBULATORY_CARE_PROVIDER_SITE_OTHER): Payer: Medicare PPO | Admitting: Neurology

## 2013-12-04 ENCOUNTER — Other Ambulatory Visit: Payer: Self-pay | Admitting: Neurology

## 2013-12-04 ENCOUNTER — Ambulatory Visit (INDEPENDENT_AMBULATORY_CARE_PROVIDER_SITE_OTHER): Payer: Medicare PPO | Admitting: Nurse Practitioner

## 2013-12-04 ENCOUNTER — Encounter: Payer: Self-pay | Admitting: Nurse Practitioner

## 2013-12-04 VITALS — BP 127/81 | HR 76 | Wt 320.0 lb

## 2013-12-04 DIAGNOSIS — R471 Dysarthria and anarthria: Secondary | ICD-10-CM | POA: Insufficient documentation

## 2013-12-04 DIAGNOSIS — G35 Multiple sclerosis: Secondary | ICD-10-CM

## 2013-12-04 DIAGNOSIS — R269 Unspecified abnormalities of gait and mobility: Secondary | ICD-10-CM

## 2013-12-04 DIAGNOSIS — G35D Multiple sclerosis, unspecified: Secondary | ICD-10-CM

## 2013-12-04 DIAGNOSIS — Z5181 Encounter for therapeutic drug level monitoring: Secondary | ICD-10-CM

## 2013-12-04 LAB — COMPREHENSIVE METABOLIC PANEL
A/G RATIO: 1.8 (ref 1.1–2.5)
ALT: 10 IU/L (ref 0–32)
AST: 17 IU/L (ref 0–40)
Albumin: 4.1 g/dL (ref 3.5–5.5)
Alkaline Phosphatase: 87 IU/L (ref 39–117)
BUN / CREAT RATIO: 15 (ref 9–23)
BUN: 12 mg/dL (ref 6–24)
CO2: 31 mmol/L — ABNORMAL HIGH (ref 18–29)
CREATININE: 0.8 mg/dL (ref 0.57–1.00)
Calcium: 8.7 mg/dL (ref 8.7–10.2)
Chloride: 104 mmol/L (ref 96–108)
GFR, EST AFRICAN AMERICAN: 106 mL/min/{1.73_m2} (ref >59)
GFR, EST NON AFRICAN AMERICAN: 92 mL/min/{1.73_m2} (ref >59)
GLOBULIN, TOTAL: 2.3 g/dL (ref 1.5–4.5)
Glucose: 93 mg/dL (ref 65–99)
Potassium: 4.4 mmol/L (ref 3.5–5.2)
SODIUM: 142 mmol/L (ref 134–144)
Total Bilirubin: 0.3 mg/dL (ref 0.0–1.2)
Total Protein: 6.4 g/dL (ref 6.0–8.5)

## 2013-12-04 LAB — CBC WITH DIFFERENTIAL/PLATELET
BASOS ABS: 0 10*3/uL (ref 0.0–0.2)
BASOS: 0 %
EOS: 0 %
Eosinophils Absolute: 0 10*3/uL (ref 0.0–0.4)
HEMATOCRIT: 32.9 % — AB (ref 34.0–46.6)
HEMOGLOBIN: 10.9 g/dL — AB (ref 11.1–15.9)
Lymphocytes Absolute: 0.4 10*3/uL — ABNORMAL LOW (ref 0.7–3.1)
Lymphs: 8 %
MCH: 25.3 pg — ABNORMAL LOW (ref 26.6–33.0)
MCHC: 33.1 g/dL (ref 31.5–35.7)
MCV: 76 fL — ABNORMAL LOW (ref 79–97)
MONOCYTES: 10 %
Monocytes Absolute: 0.5 10*3/uL (ref 0.1–0.9)
NEUTROS ABS: 3.8 10*3/uL (ref 1.4–7.0)
Neutrophils Relative %: 82 %
RBC: 4.31 x10E6/uL (ref 3.77–5.28)
RDW: 17.6 % — ABNORMAL HIGH (ref 12.3–15.4)
WBC: 4.7 10*3/uL (ref 3.4–10.8)

## 2013-12-04 MED ORDER — METHYLPREDNISOLONE SODIUM SUCC 500 MG IJ SOLR
1000.0000 mg | INTRAMUSCULAR | Status: DC
  Administered 2013-12-04: 1000 mg via INTRAVENOUS

## 2013-12-04 NOTE — Patient Instructions (Addendum)
IV solumedrol for 5 days, HOME health to administer over the week end Make a follow up visit next week

## 2013-12-04 NOTE — Telephone Encounter (Signed)
Rx was sent one time only, patient was asked to request refills from PCP

## 2013-12-04 NOTE — Progress Notes (Signed)
Patient here seeing Darrol Angel, NP with MS exacerbation.   Order for Solumedrol 1000mg  IV today and 4 additional doses with home health agency.  Patient to treatment room, labs needed to be drawn.  Arlene with [facility] drew blood using 23g butterfly in left AC, IV tubing was attached after lab draw.  Solumedrol 1000mg /250cc NaCl started at 0930.  Upon completion IV discontinued and removed.  Patient asked about her Protonix, and was going to check with her PCP.

## 2013-12-04 NOTE — Progress Notes (Signed)
[facility]  PATIENT: [name] DOB: 13-Apr-1971   REASON FOR VISIT: follow up MS exacerbation    HISTORY OF PRESENT ILLNESS: [name], 43 year old black female returns for followup on an urgent basis. She has history of multiple sclerosis and is currently on gilenya. She reports that last week on Saturday, February 28 she awakened with a numbness in her right leg and right arm and weakness having trouble with her ambulation. She has not fallen. She denies any fever, respiratory symptoms, gastrointestinal symptoms or urinary symptoms. She has had no loss of bowel or bladder control. She is having problems with her speech. She called into the office on Tuesday of this week to be seen for her exacerbation. She returns today for reevaluation.She denies  blurred vision or double vision,  does have fatigue for which she takes Provigil.  Most recent MRI was in May 2014. This is her second exacerbation in less than a year.  HISTORY:[name] is a 43 year old right-handed [place] American single female from [place], [place], she was diagnosed with multiple sclerosis 10/2004 with positive MRI of the brain and 6 oligoclonal bands in CSF. She was patient of Dr. Sandria Manly.  She was begun on Avonex. 03/12/2006 she had new cervical medullary lesion and was changed to rebif. She is in the EPOC study with Gilenya and began the medication 05/24/10. She is not having any side effects from the medication. She had a normal examination at the [facility] specialists clinic checking for macular edema on 08/17/10. She denies side effects from the medication.  She denies Lhermitte's sign or bowel or bladder dysfunction. She has lower back pain and right hip pain that extends to her right upper thigh. It occurs intermittently. She has pain in the muscles of her arms and legs.  She has thyroid disease and thyroid nodules and was followed by Dr.Kerr, endocrinologist but now by Dr.  Louis Meckel who indicates that she has diabetes mellitus. Anti thyroglobulin and thyro peroxidase antibodies have been negative. 05/29/2011 CMP and CBC were normal except for white blood cell count 3800 and hemoglobin 11.5.glucose was 106.  06/25/11 MRI of the brain and cervical spine showed a few scattered supratentorial nonspecific foci of T2 hyperintensity without enhancement and no change versus 04/19/09. There was disc bulging at C5-6 and C6-7 without cord lesions present and no change versus 06/16/08.  She complains of lower back pain which is not radiating to her legs. She is on Kadian 30 mg XR 24 one in the morning and Kadian 50 mg XR one in the evening for fibromyalgia pain and leg pain. She is hesitant to increase Lyrica 75 mg twice a day because of weight gain. She is on baclofen 20 mg twice a day.  She has numbness in the right anterior thigh which may be lumbar radiculopathy versus meralgia paresthetica. Her bowel and bladder function are normal.  She is independent in her activities of daily living. She has had recurrence of pain in her back and right hip down the right leg.This is made worse by walking and decreased with sitting down. She rates it 6/10.It is also made worse by cold air. Thursday 11/08/11 she felt better but was overtired. She noticed her right leg dragging. 11/13/11, she stood up and noted a lightheaded sensation and hot flash. She noted increased numbness in her legs  She developed foreign accent syndrome in 2012. A course of 3 days of IV Solu-Medrol followed by po prednisone was effective. This was  her first attacks since she had begun Gilenya.  She is using CPAP At 13 cm of water with ESS 3. She had pulmonary function tests because of decreased breath sounds which were normal.  07/08/2012 she awoke with nausea and shaking of her right hand and arm, and inability to speak. Her right leg "did not want to go". The following day her speech was hesitant but well-formed. She was treated by  iv followed by po steroid.  UPDATE May 23rd, 2014, last clinical visit is Oct 23, 2012 by Dr. Sandria Manly  She complains of bilateral leg hot, burning sensation, right leg pain from hip to foot, She complains of diffuse body achy pain, She is still taking Gilenya, both legs and feet hurt burning sensation, especially at night time, on chronic morphine,  UPDATE June 5th 2014:  Since her last visit in May 20 third, she had multiple phone call to our office, with constellation of complains, including increased bilateral lower extremity tremor, gait difficulty, continued language difficulty, she is taking baclofen 20 mg 3 times a day, Xanax 0. 2 5 mg twice a day per instruction of on-call physician.  UPDATE July 7th 2014  She presented with progressive weakness of left lower extremity and difficulty with walking as well as progression of tremor affecting right extremities more than left extremities and also affecting her speech. She was admitted for a course of Solu-Medrol intravenously, 500 mg every 12 hours x6 from June 16th to June 18th, which has been very helpful. She wants home physical therapy  MRI of brain showed few periventricular and subcortical T2 hyperintensities. Some of these are hypointense on T1 views. No abnormal lesions are seen on post contrast views      REVIEW OF SYSTEMS: Full 14 system review of systems performed and notable only for those listed, all others are neg:  Constitutional: Fatigue  Cardiovascular: N/A  Ear/Nose/Throat: N/A  Skin: N/A  Eyes: N/A  Respiratory: N/A  Gastroitestinal: N/A  Hematology/Lymphatic: N/A  Endocrine: N/A Musculoskeletal: Achy muscles, difficulty walking Allergy/Immunology: N/A  Neurological: Headache, numbness, speech difficulty, weakness, tremors Psychiatric: Anxiety Sleep obstructive sleep apnea   ALLERGIES: No Known Allergies  HOME MEDICATIONS: Outpatient Prescriptions Prior to Visit  Medication Sig Dispense Refill  . ALPRAZolam  (XANAX) 0.25 MG tablet Take 1 tablet (0.25 mg total) by mouth 2 (two) times daily.  60 tablet  5  . baclofen (LIORESAL) 20 MG tablet Take 1 tablet (20 mg total) by mouth 2 (two) times daily.  180 tablet  1  . cetirizine (ZYRTEC) 10 MG tablet Take 10 mg by mouth daily.      Marland Kitchen docusate sodium (COLACE) 100 MG capsule Take 200 mg by mouth 2 (two) times daily.      . Fingolimod HCl (GILENYA) 0.5 MG CAPS Take 1 capsule (0.5 mg total) by mouth daily.  90 capsule  3  . FLUoxetine (PROZAC) 20 MG capsule Take 1 capsule (20 mg total) by mouth daily.  90 capsule  1  . fluticasone (FLONASE) 50 MCG/ACT nasal spray Place 2 sprays into the nose daily.      Marland Kitchen levothyroxine (SYNTHROID, LEVOTHROID) 75 MCG tablet Take 100 mcg by mouth daily.       . meloxicam (MOBIC) 7.5 MG tablet Take 15 mg by mouth daily.       . modafinil (PROVIGIL) 200 MG tablet Take 1 tablet (200 mg total) by mouth daily.  30 tablet  5  . morphine (KADIAN) 30 MG 24 hr capsule  Take 50 mg by mouth every morning.      Marland Kitchen. morphine (KADIAN) 50 MG 24 hr capsule Take 1 capsule (50 mg total) by mouth every evening.  30 capsule  0  . oxyCODONE-acetaminophen (PERCOCET) 5-325 MG per tablet Take 1 tablet by mouth 2 (two) times daily.  60 tablet  0  . pantoprazole (PROTONIX) 40 MG tablet Take 1 tablet (40 mg total) by mouth daily.  30 tablet  1  . polyethylene glycol (MIRALAX / GLYCOLAX) packet Take 17 g by mouth as needed.      . pregabalin (LYRICA) 75 MG capsule Take 1 capsule (75 mg total) by mouth 2 (two) times daily.  60 capsule  5  . Prenatal Vit-Fe Sulfate-FA (PRENATAL VITAMIN PO) Take by mouth. Patient not pregnant these work better for her.      . promethazine (PHENERGAN) 25 MG tablet Take 1 tablet (25 mg total) by mouth every 6 (six) hours as needed for nausea or vomiting.  15 tablet  0  . azithromycin (ZITHROMAX) 250 MG tablet       . metroNIDAZOLE (FLAGYL) 500 MG tablet        No facility-administered medications prior to visit.    PAST  MEDICAL HISTORY: Past Medical History  Diagnosis Date  . MS (multiple sclerosis)   . Thyroid disease   . Fibromyalgia   . Sleep apnea   . Acid reflux   . Diabetes mellitus without complication     states she was "prediabetic" at one time    PAST SURGICAL HISTORY: Past Surgical History  Procedure Laterality Date  . Hernia repair    . Endometrial biopsy      FAMILY HISTORY: Family History  Problem Relation Age of Onset  . Hypertension Mother   . High blood pressure Father   . Migraines Mother   . Lung cancer Father     SOCIAL HISTORY: History   Social History  . Marital Status: Divorced    Spouse Name: N/A    Number of Children: 1  . Years of Education: college   Occupational History  .      disabled   Social History Main Topics  . Smoking status: Former Smoker    Quit date: 10/01/1988  . Smokeless tobacco: Never Used  . Alcohol Use: No     Comment: Quit alcohol consumption in 1990's  . Drug Use: No  . Sexual Activity: Not on file   Other Topics Concern  . Not on file   Social History Narrative   She lives with her mother, daughter, she is on disability.    Right handed.   Caffeine soda 2 daily     PHYSICAL EXAM  Filed Vitals:   12/04/13 0819  BP: 127/81  Pulse: 76  Weight: 320 lb (145.151 kg)   Cannot calculate BMI with a height equal to zero.  Generalized: Well developed, obese female in no acute distress  Head: normocephalic and atraumatic,. Oropharynx benign  Neck: Supple, no carotid bruits  Cardiac: Regular rate rhythm, no murmur  Skin: Lower extremities edema bilaterally  Neurological examination   Mentation: Alert oriented to time, place, history taking. Follows all commands.  Speech dysarthric but able to understand  Cranial nerve II-XII: Visual acuity 20/20 bilaterally. Pupils were equal round reactive to light extraocular movements were full, visual field were full on confrontational test. Facial sensation and strength were  normal. hearing was intact to finger rubbing bilaterally. Uvula tongue midline. head turning and shoulder shrug  were normal and symmetric.Tongue protrusion into cheek strength was normal. Motor: normal bulk and tone, full strength in the BUE,  3-4/5 right lower extremity Sensory: normal and symmetric to light touch, pinprick, and  vibration  all extremities Coordination: finger-nose-finger, clumsy, difficulty with heel-to-shin bilaterally right leg, normal on the left Reflexes: Depressed upper lower and symmetric  Gait and Station: Rising up from seated position with assistance, wide based unsteady gait with cane. Limping on the right leg. Tandem gait is unsteady  DIAGNOSTIC DATA (LABS, IMAGING, TESTING) - I reviewed patient records, labs, notes, testing and imaging myself where available.  Lab Results  Component Value Date   WBC 9.2 08/22/2013   HGB 10.0* 08/22/2013   HCT 31.9* 08/22/2013   MCV 83.5 08/22/2013   PLT 298 08/22/2013      Component Value Date/Time   NA 141 08/22/2013 2139   NA 141 03/05/2013 1520   K 3.9 08/22/2013 2139   CL 102 08/22/2013 2139   CO2 29 08/22/2013 2139   GLUCOSE 133* 08/22/2013 2139   GLUCOSE 90 03/05/2013 1520   BUN 14 08/22/2013 2139   BUN 12 03/05/2013 1520   CREATININE 0.90 08/22/2013 2139   CALCIUM 8.8 08/22/2013 2139   PROT 6.6 08/22/2013 2139   PROT 6.3 03/05/2013 1520   ALBUMIN 3.5 08/22/2013 2139   AST 19 08/22/2013 2139   ALT 13 08/22/2013 2139   ALKPHOS 70 08/22/2013 2139   BILITOT 0.3 08/22/2013 2139   GFRNONAA 78* 08/22/2013 2139   GFRAA >90 08/22/2013 2139    Lab Results  Component Value Date   HGBA1C 5.5 03/16/2013    Lab Results  Component Value Date   TSH 0.138* 03/16/2013      ASSESSMENT AND PLAN  43 y.o. year old female  has a past medical history of MS (multiple sclerosis); here for exacerbation of her symptoms with right lower leg weakness, dysarthria and gait abnormality. This is her second exacerbation in less than a  year.   IV solumedrol  1000mg  for 5 days, HOME health to administer over the week end Continue Gilenya Labs today before Solumedrol infusion  Make a follow up visit next week with me or Dr. Carmelina Noun, [facility], [facility], APRN  [facility] [address] [place], [place] [address] [phone]

## 2013-12-04 NOTE — Patient Instructions (Signed)
Patient was instructed that the home health agency would be in touch to have the patient receive Solumedrol at home.

## 2013-12-05 ENCOUNTER — Telehealth: Payer: Self-pay | Admitting: Neurology

## 2013-12-05 MED ORDER — PROMETHAZINE HCL 25 MG PO TABS: 25.0000 mg | ORAL_TABLET | Freq: Four times a day (QID) | ORAL | Status: DC | PRN

## 2013-12-05 NOTE — Telephone Encounter (Signed)
I called the patient. The patient is on solumedrol and she is having some problems with nausea. I will call in a Rx for the phenergan.

## 2013-12-07 ENCOUNTER — Telehealth: Payer: Self-pay | Admitting: Neurology

## 2013-12-07 NOTE — Telephone Encounter (Signed)
Patient calling to state that she gets IV treatments for her multiple sclerosis, and someone called her on the weekend to state that they don't know who is supposed to come and give her treatment today--patient tried calling the company but they said they did not know what she was talking about. Please call patient and advise.

## 2013-12-07 NOTE — Telephone Encounter (Signed)
Patient states she did not receive the home health order for the Solu-Medrol this weekend.   Please advise.

## 2013-12-08 NOTE — Progress Notes (Signed)
Quick Note:  Shared stable lab results with patient per MS Martin's /Dr Zannie CoveYan's findings, she verbalized understanding ______

## 2013-12-10 ENCOUNTER — Other Ambulatory Visit: Payer: Self-pay | Admitting: Neurology

## 2013-12-11 ENCOUNTER — Encounter: Payer: Self-pay | Admitting: Neurology

## 2013-12-11 ENCOUNTER — Ambulatory Visit (INDEPENDENT_AMBULATORY_CARE_PROVIDER_SITE_OTHER): Payer: Medicare PPO | Admitting: Neurology

## 2013-12-11 VITALS — BP 128/78 | HR 74 | Ht 64.0 in | Wt 320.0 lb

## 2013-12-11 DIAGNOSIS — G35 Multiple sclerosis: Secondary | ICD-10-CM

## 2013-12-11 DIAGNOSIS — R269 Unspecified abnormalities of gait and mobility: Secondary | ICD-10-CM

## 2013-12-11 NOTE — Progress Notes (Signed)
[facility]  PATIENT: [name] DOB: 08-31-1971  History: [name] is a 43 year-old right-handed [place] American single female from [place], [place], she was diagnosed with multiple sclerosis 10/2004 with positive MRI of the brain and 6 oligoclonal bands in CSF. She was patient of Dr. Sandria Manly.   She was begun on Avonex. 03/12/2006 she had new cervical medullary lesion and was changed to rebif. She is in the EPOC study with Gilenya and began the medication 05/24/10. She is not having any side effects from the medication. She had a normal examination at the [facility] specialists clinic checking for macular edema on 08/17/10. She denies side effects from the medication.   She denies Lhermitte's sign or bowel or bladder dysfunction. She has lower back pain and right hip pain that extends to her right upper thigh. It occurs intermittently. She has pain in the muscles of her arms and legs.   She has thyroid disease and thyroid nodules and was followed by Dr.Kerr, endocrinologist but now by Dr. Louis Meckel who indicates that she has diabetes mellitus. Anti thyroglobulin and thyro peroxidase antibodies have been negative. 05/29/2011 CMP and CBC were normal except for white blood cell count 3800 and hemoglobin 11.5.glucose was 106.   06/25/11 MRI of the brain and cervical spine showed a few scattered supratentorial nonspecific foci of T2 hyperintensity without enhancement and no change versus 04/19/09. There was disc bulging at C5-6 and C6-7 without cord lesions present and no change versus 06/16/08.   She has numbness in the right anterior thigh which may be lumbar radiculopathy versus meralgia paresthetica. Her bowel and bladder function are normal.   She is independent in her activities of daily living. She has had recurrence of pain in her back and right hip down the right leg.This is made worse by walking and decreased with sitting down. She rates it 6/10.It is also  made worse by cold air. Thursday 11/08/11 she felt better but was overtired. She noticed her right leg dragging. 11/13/11, she stood up and noted a lightheaded sensation and hot flash. She noted increased numbness in her legs   She developed foreign accent syndrome in 2012. A course of 3 days of IV Solu-Medrol followed by po prednisone was effective. This was her first attacks since she had begun Gilenya.   She is using CPAP At 13 cm of water with ESS 3. She had pulmonary function tests because of decreased breath sounds which were normal.   07/08/2012 she awoke with nausea and shaking of her right hand and arm, and inability to speak. Her right leg "did not want to go". The following day her speech was hesitant but well-formed. She was treated by iv followed by po steroid.    UPDATE June 5th 2014:  Since her last visit in May 20 third, she had multiple phone call to our office, with constellation of complains, including increased bilateral lower extremity tremor, gait difficulty, continued language difficulty, she is taking baclofen 20 mg 3 times a day, Xanax 0. 2 5 mg twice a day per instruction of on-call physician.   UPDATE July 7th 2014  She presented with progressive weakness of left lower extremity and difficulty with walking as well as progression of tremor affecting right extremities more than left extremities and also affecting her speech.   She was admitted for a course of Solu-Medrol intravenously, 500 mg every 12 hours x6 from June 16th to June 18th, which has been very helpful. She wants  home physical therapy   MRI of brain showed few periventricular and subcortical T2 hyperintensities. Some of these are hypointense on T1 views. No abnormal lesions are seen on post contrast views   UPDATE March 13th 2015: She was seen by [name] in March 3rd, reported that  In February 28 she awakened with a numbness in her right leg and right arm and weakness having trouble with her ambulation. She is  having problems with her speech.  She received five days of iv solumedrol, which did help her symptoms moderately, but she complains of upset stomach, loss of appetite, elevated glucose, mother complains of labile emotion  REVIEW OF SYSTEMS: Full 14 system review of systems performed and notable only for those listed, all others are neg:  Appetite change, fatigue, shortness of breath, back pain, achy muscles, tremor, nervousness   ALLERGIES: No Known Allergies  HOME MEDICATIONS: Outpatient Prescriptions Prior to Visit  Medication Sig Dispense Refill  . ALPRAZolam (XANAX) 0.25 MG tablet Take 1 tablet (0.25 mg total) by mouth 2 (two) times daily.  60 tablet  5  . baclofen (LIORESAL) 20 MG tablet TAKE 1 TABLET BY MOUTH TWICE DAILY  180 tablet  4  . cetirizine (ZYRTEC) 10 MG tablet Take 10 mg by mouth daily.      Marland Kitchen docusate sodium (COLACE) 100 MG capsule Take 200 mg by mouth 2 (two) times daily.      . Fingolimod HCl (GILENYA) 0.5 MG CAPS Take 1 capsule (0.5 mg total) by mouth daily.  90 capsule  3  . FLUoxetine (PROZAC) 20 MG capsule TAKE 1 CAPSULE BY MOUTH EVERY DAY  90 capsule  4  . fluticasone (FLONASE) 50 MCG/ACT nasal spray Place 2 sprays into the nose daily.      Marland Kitchen levothyroxine (SYNTHROID, LEVOTHROID) 75 MCG tablet Take 100 mcg by mouth daily.       . meloxicam (MOBIC) 7.5 MG tablet Take 15 mg by mouth daily.       Marland Kitchen morphine (KADIAN) 30 MG 24 hr capsule Take 50 mg by mouth every morning.      Marland Kitchen morphine (KADIAN) 50 MG 24 hr capsule Take 1 capsule (50 mg total) by mouth every evening.  30 capsule  0  . oxyCODONE-acetaminophen (PERCOCET) 5-325 MG per tablet Take 1 tablet by mouth 2 (two) times daily.  60 tablet  0  . pantoprazole (PROTONIX) 40 MG tablet Take 1 tablet (40 mg total) by mouth daily.  30 tablet  1  . polyethylene glycol (MIRALAX / GLYCOLAX) packet Take 17 g by mouth as needed.      . pregabalin (LYRICA) 75 MG capsule Take 1 capsule (75 mg total) by mouth 2 (two) times daily.   60 capsule  5  . Prenatal Vit-Fe Sulfate-FA (PRENATAL VITAMIN PO) Take by mouth. Patient not pregnant these work better for her.      . promethazine (PHENERGAN) 25 MG tablet Take 1 tablet (25 mg total) by mouth every 6 (six) hours as needed for nausea or vomiting.  30 tablet  2  . modafinil (PROVIGIL) 200 MG tablet Take 1 tablet (200 mg total) by mouth daily.  30 tablet  5   Facility-Administered Medications Prior to Visit  Medication Dose Route Frequency Provider Last Rate Last Dose  . methylPREDNISolone sodium succinate (SOLU-MEDROL) 1,000 mg in sodium chloride 0.9 % 100 mL IVPB  1,000 mg Intravenous Continuous Nilda Riggs, NP   1,000 mg at 12/04/13 0930    PAST MEDICAL HISTORY:  Past Medical History  Diagnosis Date  . MS (multiple sclerosis)   . Thyroid disease   . Fibromyalgia   . Sleep apnea   . Acid reflux   . Diabetes mellitus without complication     states she was "prediabetic" at one time    PAST SURGICAL HISTORY: Past Surgical History  Procedure Laterality Date  . Hernia repair    . Endometrial biopsy      FAMILY HISTORY: Family History  Problem Relation Age of Onset  . Hypertension Mother   . High blood pressure Father   . Migraines Mother   . Lung cancer Father     SOCIAL HISTORY: History   Social History  . Marital Status: Divorced    Spouse Name: N/A    Number of Children: 1  . Years of Education: college   Occupational History  .      disabled   Social History Main Topics  . Smoking status: Former Smoker    Quit date: 10/01/1988  . Smokeless tobacco: Never Used  . Alcohol Use: No     Comment: Quit alcohol consumption in 1990's  . Drug Use: No  . Sexual Activity: Not on file   Other Topics Concern  . Not on file   Social History Narrative   She lives with her mother, daughter, she is on disability.    Right handed.   Caffeine soda 2 daily     PHYSICAL EXAM  Filed Vitals:   12/11/13 0828  BP: 128/78  Pulse: 74  Height:  5\' 4"  (1.626 m)  Weight: 320 lb (145.151 kg)   Body mass index is 54.9 kg/(m^2).  Generalized: Well developed, obese female in no acute distress  Head: normocephalic and atraumatic,. Oropharynx benign  Neck: Supple, no carotid bruits  Cardiac: Regular rate rhythm, no murmur  Skin: Lower extremities edema bilaterally  Neurological examination   Mentation: Alert oriented to time, place, history taking. Follows all commands.  obese, emotional, Cranial nerve II-XII: upils were equal round reactive to light extraocular movements were full, visual field were full on confrontational test. Facial sensation and strength were normal. hearing was intact to finger rubbing bilaterally. Uvula tongue midline. head turning and shoulder shrug were normal and symmetric.Tongue protrusion into cheek strength was normal. Motor: normal bulk and tone, full strength in bilateral upper and lower extremity Sensory: normal and symmetric to light touch, pinprick, and  vibration  all extremities Coordination: finger-nose-finger, clumsy, difficulty with heel-to-shin bilaterally right leg, normal on the left Reflexes: Depressed upper lower and symmetric  Gait and Station: Rising up from seated position pushing on a chair arm, cautious wide based unsteady gait,  DIAGNOSTIC DATA (LABS, IMAGING, TESTING) - I reviewed patient records, labs, notes, testing and imaging myself where available.  Lab Results  Component Value Date   WBC 4.7 12/04/2013   HGB 10.9* 12/04/2013   HCT 32.9* 12/04/2013   MCV 76* 12/04/2013   PLT 298 08/22/2013      Component Value Date/Time   NA 142 12/04/2013 0923   NA 141 08/22/2013 2139   K 4.4 12/04/2013 0923   CL 104 12/04/2013 0923   CO2 31* 12/04/2013 0923   GLUCOSE 93 12/04/2013 0923   GLUCOSE 133* 08/22/2013 2139   BUN 12 12/04/2013 0923   BUN 14 08/22/2013 2139   CREATININE 0.80 12/04/2013 0923   CALCIUM 8.7 12/04/2013 0923   PROT 6.4 12/04/2013 0923   PROT 6.6 08/22/2013 2139   ALBUMIN 3.5  08/22/2013 2139  AST 17 12/04/2013 0923   ALT 10 12/04/2013 0923   ALKPHOS 87 12/04/2013 0923   BILITOT 0.3 12/04/2013 0923   GFRNONAA 92 12/04/2013 0923   GFRAA 106 12/04/2013 0923    Lab Results  Component Value Date   HGBA1C 5.5 03/16/2013    Lab Results  Component Value Date   TSH 0.138* 03/16/2013    ASSESSMENT AND PLAN  43 y.o. year old female with past medical history of MS, diagnosis was based on 6 oligoclonal banding from CSF, but really minimal findings on MRI of the brain, and essentially normal MRI cervical, which would not explain her constellation of complaints, especially her intermittent foreign accent and gait difficulty.  1. Keep follow up with Brooke Jonesarolyn in May  2. Repeat MRI brain and cervical w/wo 3. Continue Moderate exercise.

## 2013-12-16 NOTE — Telephone Encounter (Signed)
Rx has been faxed.

## 2013-12-18 ENCOUNTER — Other Ambulatory Visit: Payer: Self-pay | Admitting: Neurology

## 2013-12-18 NOTE — Telephone Encounter (Signed)
Rx signed and faxed.

## 2014-01-08 ENCOUNTER — Other Ambulatory Visit: Payer: Self-pay

## 2014-01-08 MED ORDER — FINGOLIMOD HCL 0.5 MG PO CAPS: 0.5000 mg | ORAL_CAPSULE | Freq: Every day | ORAL | Status: DC

## 2014-01-13 ENCOUNTER — Other Ambulatory Visit: Payer: Self-pay | Admitting: Neurology

## 2014-02-23 ENCOUNTER — Ambulatory Visit (INDEPENDENT_AMBULATORY_CARE_PROVIDER_SITE_OTHER): Payer: Medicare PPO | Admitting: Nurse Practitioner

## 2014-02-23 ENCOUNTER — Encounter: Payer: Self-pay | Admitting: Nurse Practitioner

## 2014-02-23 VITALS — BP 153/71 | HR 73 | Ht 65.0 in | Wt 325.0 lb

## 2014-02-23 DIAGNOSIS — R269 Unspecified abnormalities of gait and mobility: Secondary | ICD-10-CM

## 2014-02-23 DIAGNOSIS — G35 Multiple sclerosis: Secondary | ICD-10-CM

## 2014-02-23 NOTE — Patient Instructions (Signed)
Continue Gilenya  Reviewed labs  MRI of the brain and cervical region Moderate exercise healthy  diet F/U in 6 months

## 2014-02-23 NOTE — Progress Notes (Signed)
[facility]  PATIENT: [name] DOB: August 01, 1971   REASON FOR VISIT: follow up for MS    HISTORY OF PRESENT ILLNESS:[name], 43 year old female returns for followup. She is alone at today's visit. She was last seen in this office 12/11/2013 Dr. [name] to followup after a recent exacerbation of her symptoms She has history of multiple sclerosis with an episode of numbness and weakness in her right arm and leg in March and difficulty with ambulation. She received 5 days worth of IV Solu-Medrol and her symptoms improved. MRI of the brain and cervical spine was listed in plan of care per the orders were never entered. She is currently on gilenya denies side effects of the drug. Recent labs primary care showed elevated cholesterol and LDL. She is supposed to be watching her diet and exercising. She also has history of obstructive sleep apnea and uses CPAP. She claims she is compliant. She returns for reevaluation  HISTORY: she was diagnosed with multiple sclerosis 10/2004 with positive MRI of the brain and 6 oligoclonal bands in CSF. She was patient of Dr. Sandria Manly.  She was begun on Avonex. 03/12/2006 she had new cervical medullary lesion and was changed to rebif. She is in the EPOC study with Gilenya and began the medication 05/24/10. She is not having any side effects from the medication. She had a normal examination at the [facility] clinic checking for macular edema on 08/17/10. She denies side effects from the medication.  She denies Lhermitte's sign or bowel or bladder dysfunction. She has lower back pain and right hip pain that extends to her right upper thigh. It occurs intermittently. She has pain in the muscles of her arms and legs.  She has thyroid disease and thyroid nodules and was followed by Dr.Kerr, endocrinologist but now by Dr. Louis Meckel who indicates that she has diabetes mellitus. Anti thyroglobulin and thyro peroxidase antibodies have  been negative. 05/29/2011 CMP and CBC were normal except for white blood cell count 3800 and hemoglobin 11.5.glucose was 106.  06/25/11 MRI of the brain and cervical spine showed a few scattered supratentorial nonspecific foci of T2 hyperintensity without enhancement and no change versus 04/19/09. There was disc bulging at C5-6 and C6-7 without cord lesions present and no change versus 06/16/08.  She has numbness in the right anterior thigh which may be lumbar radiculopathy versus meralgia paresthetica. Her bowel and bladder function are normal.  She is independent in her activities of daily living. She has had recurrence of pain in her back and right hip down the right leg.This is made worse by walking and decreased with sitting down. She rates it 6/10.It is also made worse by cold air. Thursday 11/08/11 she felt better but was overtired. She noticed her right leg dragging. 11/13/11, she stood up and noted a lightheaded sensation and hot flash. She noted increased numbness in her legs  She developed foreign accent syndrome in 2012. A course of 3 days of IV Solu-Medrol followed by po prednisone was effective. This was her first attacks since she had begun Gilenya.  She is using CPAP At 13 cm of water with ESS 3. She had pulmonary function tests because of decreased breath sounds which were normal.  07/08/2012 she awoke with nausea and shaking of her right hand and arm, and inability to speak. Her right leg "did not want to go". The following day her speech was hesitant but well-formed. She was treated by iv followed by po steroid.  UPDATE July 7th 2014  She presented with progressive weakness of left lower extremity and difficulty with walking as well as progression of tremor affecting right extremities more than left extremities and also affecting her speech.  She was admitted for a course of Solu-Medrol intravenously, 500 mg every 12 hours x6 from June 16th to June 18th, which has been very helpful. She wants  home physical therapy  MRI of brain showed few periventricular and subcortical T2 hyperintensities. Some of these are hypointense on T1 views. No abnormal lesions are seen on post contrast views  UPDATE March 13th 2015: She was seen by [name] in March 3rd, reported that In February 28 she awakened with a numbness in her right leg and right arm and weakness having trouble with her ambulation. She is having problems with her speech. She received five days of iv solumedrol, which did help her symptoms moderately, but she complains of upset stomach, loss of appetite, elevated glucose, mother complains of labile emotion  REVIEW OF SYSTEMS: Full 14 system review of systems performed and notable only for those listed, all others are neg:  Constitutional: Fatigue Cardiovascular: N/A  Ear/Nose/Throat: N/A  Skin: N/A  Eyes: N/A  Respiratory: N/A  Gastroitestinal: N/A  Hematology/Lymphatic: N/A  Endocrine: N/A Musculoskeletal: Back pain, walking difficulty  Allergy/Immunology: N/A  Neurological: Numbness in the hands Psychiatric: Anxiety  Sleep : Obstructive sleep apnea   ALLERGIES: No Known Allergies  HOME MEDICATIONS: Outpatient Prescriptions Prior to Visit  Medication Sig Dispense Refill  . ALPRAZolam (XANAX) 0.25 MG tablet TAKE 1 TABLET BY MOUTH TWICE DAILY  60 tablet  5  . baclofen (LIORESAL) 20 MG tablet TAKE 1 TABLET BY MOUTH TWICE DAILY  180 tablet  4  . cetirizine (ZYRTEC) 10 MG tablet Take 10 mg by mouth daily.      Marland Kitchen docusate sodium (COLACE) 100 MG capsule Take 200 mg by mouth 2 (two) times daily.      . Fingolimod HCl (GILENYA) 0.5 MG CAPS Take 1 capsule (0.5 mg total) by mouth daily.  90 capsule  3  . FLUoxetine (PROZAC) 20 MG capsule TAKE 1 CAPSULE BY MOUTH EVERY DAY  90 capsule  4  . fluticasone (FLONASE) 50 MCG/ACT nasal spray Place 2 sprays into the nose daily.      Marland Kitchen LYRICA 75 MG capsule TAKE 1 CAPSULE BY MOUTH TWICE DAILY  60 capsule  5  . meloxicam (MOBIC) 7.5 MG tablet  Take 15 mg by mouth daily.       . methylPREDNISolone sodium succinate (SOLU-MEDROL) 1000 MG injection       . modafinil (PROVIGIL) 200 MG tablet TAKE 1 TABLET BY MOUTH ONCE DAILY  30 tablet  5  . oxyCODONE-acetaminophen (PERCOCET) 5-325 MG per tablet Take 1 tablet by mouth 2 (two) times daily.  60 tablet  0  . pantoprazole (PROTONIX) 40 MG tablet Take 1 tablet (40 mg total) by mouth daily.  30 tablet  1  . polyethylene glycol (MIRALAX / GLYCOLAX) packet Take 17 g by mouth as needed.      . promethazine (PHENERGAN) 25 MG tablet Take 1 tablet (25 mg total) by mouth every 6 (six) hours as needed for nausea or vomiting.  30 tablet  2  . Sodium Chloride Flush (NORMAL SALINE FLUSH) 0.9 % SOLN       . morphine (KADIAN) 50 MG 24 hr capsule Take 1 capsule (50 mg total) by mouth every evening.  30 capsule  0  . Heparin Lock  Flush (HEPARIN, PORCINE, LOCK FLUSH) 100 UNIT/ML SOLN       . levothyroxine (SYNTHROID, LEVOTHROID) 75 MCG tablet Take 100 mcg by mouth daily.       . metroNIDAZOLE (FLAGYL) 500 MG tablet 500 mg daily.      Marland Kitchen. morphine (KADIAN) 30 MG 24 hr capsule Take 50 mg by mouth every morning.      . Prenatal Vit-Fe Sulfate-FA (PRENATAL VITAMIN PO) Take by mouth. Patient not pregnant these work better for her.      . sodium chloride 0.9 % infusion        Facility-Administered Medications Prior to Visit  Medication Dose Route Frequency Provider Last Rate Last Dose  . methylPREDNISolone sodium succinate (SOLU-MEDROL) 1,000 mg in sodium chloride 0.9 % 100 mL IVPB  1,000 mg Intravenous Continuous Nilda RiggsNancy Carolyn Mikiyah Glasner, NP   1,000 mg at 12/04/13 0930    PAST MEDICAL HISTORY: Past Medical History  Diagnosis Date  . MS (multiple sclerosis)   . Thyroid disease   . Fibromyalgia   . Sleep apnea   . Acid reflux   . Diabetes mellitus without complication     states she was "prediabetic" at one time    PAST SURGICAL HISTORY: Past Surgical History  Procedure Laterality Date  . Hernia repair    .  Endometrial biopsy      FAMILY HISTORY: Family History  Problem Relation Age of Onset  . Hypertension Mother   . High blood pressure Father   . Migraines Mother   . Lung cancer Father     SOCIAL HISTORY: History   Social History  . Marital Status: Divorced    Spouse Name: N/A    Number of Children: 1  . Years of Education: college   Occupational History  .      disabled   Social History Main Topics  . Smoking status: Former Smoker    Quit date: 10/01/1988  . Smokeless tobacco: Never Used  . Alcohol Use: No     Comment: Quit alcohol consumption in 1990's  . Drug Use: No  . Sexual Activity: Not on file   Other Topics Concern  . Not on file   Social History Narrative   She lives with her mother, daughter, she is on disability.    Right handed.   Caffeine soda 2 daily     PHYSICAL EXAM  Filed Vitals:   02/23/14 0857  BP: 153/71  Pulse: 73  Height: 5\' 5"  (1.651 m)  Weight: 325 lb (147.419 kg)   Body mass index is 54.08 kg/(m^2).  Generalized: Well developed, morbidly obese female in no acute distress  Head: normocephalic and atraumatic,. Oropharynx benign  Neck: Supple, no carotid bruits  Cardiac: Regular rate rhythm, no murmur  Skin: Lower extremity peripheral edema bilaterally  Neurological examination   Mentation: Alert oriented to time, place, history taking. Follows all commands speech and language fluent  Cranial nerve II-XII: Visual acuity, 20/20 right, 20/30 left. Pupils were equal round reactive to light extraocular movements were full, visual field were full on confrontational test. Facial sensation and strength were normal. hearing was intact to finger rubbing bilaterally. Uvula tongue midline. head turning and shoulder shrug were normal and symmetric.Tongue protrusion into cheek strength was normal. Motor: normal bulk and tone, full strength in the BUE, BLE,  No focal weakness Sensory: normal and symmetric to light touch, pinprick, and   vibration in all extremities Coordination: finger-nose-finger, heel-to-shin bilaterally, no dysmetria Reflexes: Depressed upper or lower but symmetric,  plantar responses were flexor bilaterally. Gait and Station: Rising up from seated position without assistance, wide based stance,  stooped posture , ambulates with single-point cane.  DIAGNOSTIC DATA (LABS, IMAGING, TESTING) - I reviewed patient records, labs, notes, testing and imaging myself where available.  Lab Results  Component Value Date   WBC 4.7 12/04/2013   HGB 10.9* 12/04/2013   HCT 32.9* 12/04/2013   MCV 76* 12/04/2013   PLT 298 08/22/2013      Component Value Date/Time   NA 142 12/04/2013 0923   NA 141 08/22/2013 2139   K 4.4 12/04/2013 0923   CL 104 12/04/2013 0923   CO2 31* 12/04/2013 0923   GLUCOSE 93 12/04/2013 0923   GLUCOSE 133* 08/22/2013 2139   BUN 12 12/04/2013 0923   BUN 14 08/22/2013 2139   CREATININE 0.80 12/04/2013 0923   CALCIUM 8.7 12/04/2013 0923   PROT 6.4 12/04/2013 0923   PROT 6.6 08/22/2013 2139   ALBUMIN 3.5 08/22/2013 2139   AST 17 12/04/2013 0923   ALT 10 12/04/2013 0923   ALKPHOS 87 12/04/2013 0923   BILITOT 0.3 12/04/2013 0923   GFRNONAA 92 12/04/2013 0923   GFRAA 106 12/04/2013 0923       ASSESSMENT AND PLAN  43 y.o. year old female  has a past medical history of MS (multiple sclerosis);  Fibromyalgia; Sleep apnea;  and Diabetes mellitus without complication. here to followup for multiple sclerosis. She is currently on gilenya period labs done in March.  Continue Gilenya  MRI of the brain and cervical region, F/U from 5/14, recent exacerbation of symptoms Moderate exercise healthy  diet F/U in 6 months Nilda Riggs, [facility], [facility], APRN  [facility] [address] [place], [place] [address] [phone]

## 2014-02-24 DIAGNOSIS — Z0289 Encounter for other administrative examinations: Secondary | ICD-10-CM

## 2014-03-04 ENCOUNTER — Ambulatory Visit
Admission: RE | Admit: 2014-03-04 | Discharge: 2014-03-04 | Disposition: A | Discharge status: Home / Self Care | Payer: Medicare PPO | Source: Ambulatory Visit | Attending: Nurse Practitioner | Admitting: Nurse Practitioner

## 2014-03-04 DIAGNOSIS — G35 Multiple sclerosis: Secondary | ICD-10-CM

## 2014-03-04 DIAGNOSIS — R269 Unspecified abnormalities of gait and mobility: Secondary | ICD-10-CM

## 2014-03-04 MED ORDER — GADOBENATE DIMEGLUMINE 529 MG/ML IV SOLN
20.0000 mL | Freq: Once | INTRAVENOUS | Status: AC | PRN
  Administered 2014-03-04: 20 mL via INTRAVENOUS

## 2014-03-11 NOTE — Progress Notes (Signed)
Quick Note:  Relayed the information about numbness to pt. 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 I relayed that from message did not believe this the case. She verbalized understanding. I told her if any different would call her back. ______

## 2014-03-12 ENCOUNTER — Telehealth: Payer: Self-pay | Admitting: Nurse Practitioner

## 2014-03-12 NOTE — Telephone Encounter (Signed)
Patient calling to check status of disability paper work.

## 2014-03-15 NOTE — Telephone Encounter (Signed)
[name], findout who was filling her disability paper, [place] or her primary care office.

## 2014-03-16 NOTE — Telephone Encounter (Signed)
Disability paper work completed and to medical records on 03-16-14.

## 2014-04-09 ENCOUNTER — Emergency Department (HOSPITAL_BASED_OUTPATIENT_CLINIC_OR_DEPARTMENT_OTHER): Payer: Medicare PPO

## 2014-04-09 ENCOUNTER — Encounter (HOSPITAL_BASED_OUTPATIENT_CLINIC_OR_DEPARTMENT_OTHER): Payer: Self-pay | Admitting: Emergency Medicine

## 2014-04-09 ENCOUNTER — Emergency Department (HOSPITAL_BASED_OUTPATIENT_CLINIC_OR_DEPARTMENT_OTHER)
Admission: EM | Admit: 2014-04-09 | Discharge: 2014-04-09 | Disposition: A | Discharge status: Home / Self Care | Payer: Medicare PPO | Attending: Emergency Medicine | Admitting: Emergency Medicine

## 2014-04-09 DIAGNOSIS — G35 Multiple sclerosis: Secondary | ICD-10-CM | POA: Insufficient documentation

## 2014-04-09 DIAGNOSIS — E079 Disorder of thyroid, unspecified: Secondary | ICD-10-CM | POA: Insufficient documentation

## 2014-04-09 DIAGNOSIS — Z791 Long term (current) use of non-steroidal anti-inflammatories (NSAID): Secondary | ICD-10-CM | POA: Insufficient documentation

## 2014-04-09 DIAGNOSIS — H9190 Unspecified hearing loss, unspecified ear: Secondary | ICD-10-CM | POA: Insufficient documentation

## 2014-04-09 DIAGNOSIS — J209 Acute bronchitis, unspecified: Secondary | ICD-10-CM | POA: Insufficient documentation

## 2014-04-09 DIAGNOSIS — E119 Type 2 diabetes mellitus without complications: Secondary | ICD-10-CM | POA: Insufficient documentation

## 2014-04-09 DIAGNOSIS — Z79899 Other long term (current) drug therapy: Secondary | ICD-10-CM | POA: Insufficient documentation

## 2014-04-09 DIAGNOSIS — J40 Bronchitis, not specified as acute or chronic: Secondary | ICD-10-CM

## 2014-04-09 DIAGNOSIS — K219 Gastro-esophageal reflux disease without esophagitis: Secondary | ICD-10-CM | POA: Insufficient documentation

## 2014-04-09 DIAGNOSIS — IMO0002 Reserved for concepts with insufficient information to code with codable children: Secondary | ICD-10-CM | POA: Insufficient documentation

## 2014-04-09 DIAGNOSIS — Z87891 Personal history of nicotine dependence: Secondary | ICD-10-CM | POA: Insufficient documentation

## 2014-04-09 DIAGNOSIS — J0100 Acute maxillary sinusitis, unspecified: Secondary | ICD-10-CM | POA: Insufficient documentation

## 2014-04-09 DIAGNOSIS — H9209 Otalgia, unspecified ear: Secondary | ICD-10-CM | POA: Insufficient documentation

## 2014-04-09 LAB — RAPID STREP SCREEN (MED CTR MEBANE ONLY): STREPTOCOCCUS, GROUP A SCREEN (DIRECT): NEGATIVE

## 2014-04-09 MED ORDER — ALBUTEROL SULFATE HFA 108 (90 BASE) MCG/ACT IN AERS
1.0000 | INHALATION_SPRAY | Freq: Four times a day (QID) | RESPIRATORY_TRACT | Status: DC | PRN
Start: 2014-04-09 — End: 2015-01-19

## 2014-04-09 MED ORDER — AMOXICILLIN 500 MG PO CAPS: 500.0000 mg | ORAL_CAPSULE | Freq: Three times a day (TID) | ORAL | Status: DC

## 2014-04-09 MED ORDER — AMOXICILLIN 500 MG PO CAPS
500.0000 mg | ORAL_CAPSULE | Freq: Once | ORAL | Status: AC
  Administered 2014-04-09: 500 mg via ORAL
  Filled 2014-04-09: qty 1

## 2014-04-09 NOTE — ED Notes (Signed)
Pt c/o sinus congestion and bil ear pain  X 1 week

## 2014-04-09 NOTE — ED Provider Notes (Signed)
CSN: [id number]     Arrival date & time 04/09/14  1949 History   This chart was scribed for Brooke Canalavid H Yao, MD by Julian HyMorgan Graham, ED Scribe. The patient was seen in MH09/MH09. The patient's care was started at 8:42 PM.     Chief Complaint  Patient presents with  . URI   The history is provided by the patient. No language interpreter was used.   HPI Comments: [name] is a 43 y.o. female who presents to the Emergency Department complaining of new, moderate and gradually worsening left sinus congestion onset one week ago. Pt states she has associated left ear pain, left ear hearing loss, left facial pain and productive cough. Pt states she takes Zyrtec and Flonase for allergy relief. Pt states that she has frequent episodes of sinusitis. Pt denies fever. Pt states she currently does not smoke but did 20 years ago.  Past Medical History  Diagnosis Date  . MS (multiple sclerosis)   . Thyroid disease   . Fibromyalgia   . Sleep apnea   . Acid reflux   . Diabetes mellitus without complication     states she was "prediabetic" at one time   Past Surgical History  Procedure Laterality Date  . Hernia repair    . Endometrial biopsy     Family History  Problem Relation Age of Onset  . Hypertension Mother   . High blood pressure Father   . Migraines Mother   . Lung cancer Father    History  Substance Use Topics  . Smoking status: Former Smoker    Quit date: 10/01/1988  . Smokeless tobacco: Never Used  . Alcohol Use: No     Comment: Quit alcohol consumption in 1990's   OB History   Grav Para Term Preterm Abortions TAB SAB Ect Mult Living                 Review of Systems  Constitutional: Negative for fever.  HENT: Positive for congestion (left nare), ear pain (left), hearing loss (left) and sinus pressure (left).   Respiratory: Positive for cough (productive).   All other systems reviewed and are negative.     Allergies  Review of patient's allergies indicates no  known allergies.  Home Medications   Prior to Admission medications   Medication Sig Start Date End Date Taking? Authorizing Provider  ALPRAZolam Prudy Feeler(XANAX) 0.25 MG tablet TAKE 1 TABLET BY MOUTH TWICE DAILY 12/18/13   Levert FeinsteinYijun Yan, MD  baclofen (LIORESAL) 20 MG tablet TAKE 1 TABLET BY MOUTH TWICE DAILY    Levert FeinsteinYijun Yan, MD  cetirizine (ZYRTEC) 10 MG tablet Take 10 mg by mouth daily.    Historical Provider, MD  docusate sodium (COLACE) 100 MG capsule Take 200 mg by mouth 2 (two) times daily.    Historical Provider, MD  Fingolimod HCl (GILENYA) 0.5 MG CAPS Take 1 capsule (0.5 mg total) by mouth daily. 01/08/14   Levert FeinsteinYijun Yan, MD  FLUoxetine (PROZAC) 20 MG capsule TAKE 1 CAPSULE BY MOUTH EVERY DAY    Levert FeinsteinYijun Yan, MD  fluticasone (FLONASE) 50 MCG/ACT nasal spray Place 2 sprays into the nose daily.    Historical Provider, MD  levothyroxine (SYNTHROID, LEVOTHROID) 100 MCG tablet Take 100 mcg by mouth daily before breakfast.    Historical Provider, MD  LYRICA 75 MG capsule TAKE 1 CAPSULE BY MOUTH TWICE DAILY 01/13/14   Levert FeinsteinYijun Yan, MD  meloxicam (MOBIC) 7.5 MG tablet Take 15 mg by mouth daily.  Historical Provider, MD  methylPREDNISolone sodium succinate (SOLU-MEDROL) 1000 MG injection  12/04/13   Historical Provider, MD  modafinil (PROVIGIL) 200 MG tablet TAKE 1 TABLET BY MOUTH ONCE DAILY    Levert Feinstein, MD  morphine (KADIAN) 50 MG 24 hr capsule Take 50 mg by mouth 2 (two) times daily. 03/10/13   Levert Feinstein, MD  Multiple Vitamins-Minerals (MULTIVITAMIN PO) Take by mouth.    Historical Provider, MD  oxyCODONE-acetaminophen (PERCOCET) 5-325 MG per tablet Take 1 tablet by mouth 2 (two) times daily. 03/10/13   Levert Feinstein, MD  pantoprazole (PROTONIX) 40 MG tablet Take 1 tablet (40 mg total) by mouth daily. 08/24/13   Nilda Riggs, NP  polyethylene glycol [facility] / GLYCOLAX) packet Take 17 g by mouth as needed. 03/18/13   Leona Singleton, MD  promethazine (PHENERGAN) 25 MG tablet Take 1 tablet (25 mg total) by mouth  every 6 (six) hours as needed for nausea or vomiting. 12/05/13   York Spaniel, MD  Sodium Chloride Flush (NORMAL SALINE FLUSH) 0.9 % SOLN  12/04/13   Historical Provider, MD  Vitamin D, Ergocalciferol, (DRISDOL) 50000 UNITS CAPS capsule Take 50,000 Units by mouth every 7 (seven) days.    Historical Provider, MD   Triage Vitals: BP 136/66  Pulse 72  Temp(Src) 98 F (36.7 C) (Oral)  Resp 16  Ht 5\' 4"  (1.626 m)  Wt 317 lb (143.79 kg)  BMI 54.39 kg/m2  SpO2 98% Physical Exam  Nursing note and vitals reviewed. Constitutional: She is oriented to person, place, and time. She appears well-developed and well-nourished. No distress.  HENT:  Head: Normocephalic and atraumatic.  Right Ear: External ear normal.  Effusion behind left ear but not red. Tonsils minimally red. Left maxillary sinus pressure.  Eyes: Conjunctivae and EOM are normal.  Neck: Neck supple. No tracheal deviation present.  Cardiovascular: Normal rate.   Pulmonary/Chest: Effort normal. No respiratory distress. She has wheezes (Minimal wheezing bilaterally.).  Musculoskeletal: Normal range of motion.  Neurovascular intact.  Neurological: She is alert and oriented to person, place, and time.  Skin: Skin is warm and dry.  Psychiatric: She has a normal mood and affect. Her behavior is normal.    ED Course  Procedures (including critical care time) DIAGNOSTIC STUDIES: Oxygen Saturation is 98% on RA, normal by my interpretation.    COORDINATION OF CARE: 8:46 PM- Will order Chest x-ray and rapid strep test. Patient informed of current plan for treatment and evaluation and agrees with plan at this time.  Labs Review Labs Reviewed  RAPID STREP SCREEN  CULTURE, GROUP A STREP    Imaging Review Dg Chest 2 View  04/09/2014   CLINICAL DATA:  Productive cough.  EXAM: CHEST  2 VIEW  COMPARISON:  06/13/2008.  FINDINGS: Mediastinum hilar structures normal. Heart size normal. No pleural effusion or pneumothorax.  IMPRESSION: No  acute cardiopulmonary disease.   Electronically Signed   By: Maisie Fus  Register   On: 04/09/2014 21:03     EKG Interpretation None      MDM   Final diagnoses:  None   Jenney L George is a 43 y.o. female here with L ear pain, sinus congestion and pressure, cough. Likely viral. But given sinus tenderness and effusion behind L TM, concerned for early sinusitis vs otitis. Will give amoxicillin. Also minimal, wheezing but cxr clear so likely bronchitis. Will give albuterol prn.    I personally performed the services described in this documentation, which was scribed in my presence. The  recorded information has been reviewed and is accurate.    Brooke Canal, MD 04/09/14 2122

## 2014-04-09 NOTE — Discharge Instructions (Signed)
Take amoxicillin for a week.   Continue flonase and zyrtec.   Use albuterol for wheezing or shortness of breath.   Follow up with your doctor.   Return to ER if you have worse shortness of breath, fever, congestion, ear pain.

## 2014-04-11 LAB — CULTURE, GROUP A STREP

## 2014-05-06 NOTE — Telephone Encounter (Signed)
Noted  

## 2014-07-16 ENCOUNTER — Other Ambulatory Visit: Payer: Self-pay | Admitting: Neurology

## 2014-07-21 ENCOUNTER — Other Ambulatory Visit: Payer: Self-pay | Admitting: Neurology

## 2014-07-22 NOTE — Telephone Encounter (Signed)
Rx signed and faxed.

## 2014-08-20 ENCOUNTER — Telehealth: Payer: Self-pay | Admitting: Neurology

## 2014-08-20 NOTE — Telephone Encounter (Signed)
Left message for patient regarding rescheduling 08/30/14 appointment per Carolyn's schedule, rescheduled to Megan's first available on 09/21/14.

## 2014-08-24 ENCOUNTER — Ambulatory Visit (INDEPENDENT_AMBULATORY_CARE_PROVIDER_SITE_OTHER): Payer: Medicare PPO | Admitting: Adult Health

## 2014-08-24 ENCOUNTER — Encounter: Payer: Self-pay | Admitting: Adult Health

## 2014-08-24 ENCOUNTER — Telehealth: Payer: Self-pay | Admitting: Adult Health

## 2014-08-24 VITALS — BP 138/90 | HR 97 | Temp 98.6°F | Ht 64.0 in | Wt 311.0 lb

## 2014-08-24 DIAGNOSIS — G35 Multiple sclerosis: Secondary | ICD-10-CM

## 2014-08-24 NOTE — Patient Instructions (Signed)

## 2014-08-24 NOTE — Telephone Encounter (Signed)
I received the patient's lab work from [facility]. Her liver enzymes are in normal range. WBC is 4.8, hemoglobin is 11.7, hematocrit 35.2, MCV 84, lymphocytes 9.1%. Neutrophils 80.7%. Lab work is ok. Will recheck at the next visit.

## 2014-08-24 NOTE — Progress Notes (Addendum)
PATIENT: [name] DOB: 1971/07/10  REASON FOR VISIT: follow up HISTORY FROM: patient  HISTORY OF PRESENT ILLNESS: Ms. Brooke Asters- Evans Lance is a 43 year old female with a history of multiple sclerosis. She returns today for follow-up. She is currently taking Gilenya and tolerating it well. She reports that she is having numbness in the right hand and right leg. She reports that it has been numb there before but over the last month it has gotten worse.  No changes with her gait or balance. 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 Denies any new weakness. No changes in her bowels or bladder. Denies any changes with her vision.  HISTORY 12/11/13 [name]): [name] is a 43 year-old right-handed [place] American single female from [place], [place], she was diagnosed with multiple sclerosis 10/2004 with positive MRI of the brain and 6 oligoclonal bands in CSF. She was patient of Dr. Sandria Manly. She was begun on Avonex. 03/12/2006 she had new cervical medullary lesion and was changed to rebif. She is in the EPOC study with Gilenya and began the medication 05/24/10. She is not having any side effects from the medication. She had a normal examination at the [facility] checking for macular edema on 08/17/10. She denies side effects from the medication.She denies Lhermitte's sign or bowel or bladder dysfunction. She has lower back pain and right hip pain that extends to her right upper thigh. It occurs intermittently. She has pain in the muscles of her arms and legs. She has thyroid disease and thyroid nodules and was followed by Dr.Kerr, endocrinologist but now by Dr. Louis Meckel who indicates that she has diabetes mellitus. Anti thyroglobulin and thyro peroxidase antibodies have been negative. 05/29/2011 CMP and CBC were  normal except for white blood cell count 3800 and hemoglobin 11.5.glucose was 106. 06/25/11 MRI of the brain and cervical spine showed a few scattered supratentorial nonspecific foci of T2 hyperintensity without enhancement and no change versus 04/19/09. There was disc bulging at C5-6 and C6-7 without cord lesions present and no change versus 06/16/08. She has numbness in the right anterior thigh which may be lumbar radiculopathy versus meralgia paresthetica. Her bowel and bladder function are normal. She is independent in her activities of daily living. She has had recurrence of pain in her back and right hip down the right leg.This is made worse by walking and decreased with sitting down. She rates it 6/10.It is also made worse by cold air. Thursday 11/08/11 she felt better but was overtired. She noticed her right leg dragging. 11/13/11, she stood up and noted a lightheaded sensation and hot flash. She noted increased numbness in her legs. She developed foreign accent syndrome in 2012. A course of 3 days of IV Solu-Medrol followed by po prednisone was effective. This was her first attacks since she had begun Gilenya. She is using CPAP At 13 cm of water with ESS 3. She had pulmonary function tests because of decreased breath sounds which were normal. 07/08/2012 she awoke with nausea and shaking of her right hand and arm, and inability to speak. Her right leg "did not want to go". The following day her speech was hesitant but well-formed. She was treated by iv followed by po steroid.  UPDATE June 5th 2014:   Since her last  visit in May 20 third, she had multiple phone call to our office, with constellation of complains, including increased bilateral lower extremity tremor, gait difficulty, continued language difficulty, she is taking baclofen 20 mg 3 times a day, Xanax 0. 2 5 mg twice a day per instruction of on-call physician.  UPDATE July 7th 2014  She presented with progressive weakness of left lower extremity and  difficulty with walking as well as progression of tremor affecting right extremities more than left extremities and also affecting her speech. She was admitted for a course of Solu-Medrol intravenously, 500 mg every 12 hours x6 from June 16th to June 18th, which has been very helpful. She wants home physical therapy MRI of brain showed few periventricular and subcortical T2 hyperintensities. Some of these are hypointense on T1 views. No abnormal lesions are seen on post contrast views  UPDATE March 13th 2015: She was seen by [name] in March 3rd, reported that  In February 28 she awakened with a numbness in her right leg and right arm and weakness having trouble with her ambulation. She is having problems with her speech.  She received five days of iv solumedrol, which did help her symptoms moderately, but she complains of upset stomach, loss of appetite, elevated glucose, mother complains of labile emotion  REVIEW OF SYSTEMS: Out of a complete 14 system review of symptoms, the patient complains only of the following symptoms, and all other reviewed systems are negative.  Numbness, speech difficulty Back pain, aching muscles, walking difficulty Anxious Insomnia, apnea  ALLERGIES: No Known Allergies  HOME MEDICATIONS: Outpatient Prescriptions Prior to Visit  Medication Sig Dispense Refill  . albuterol (PROVENTIL HFA;VENTOLIN HFA) 108 (90 BASE) MCG/ACT inhaler Inhale 1-2 puffs into the lungs every 6 (six) hours as needed for wheezing or shortness of breath. 1 Inhaler 0  . ALPRAZolam (XANAX) 0.25 MG tablet TAKE 1 TABLET BY MOUTH TWICE DAILY 60 tablet 5  . baclofen (LIORESAL) 20 MG tablet TAKE 1 TABLET BY MOUTH TWICE DAILY 180 tablet 4  . cetirizine (ZYRTEC) 10 MG tablet Take 10 mg by mouth daily.    Marland Kitchen docusate sodium (COLACE) 100 MG capsule Take 200 mg by mouth 2 (two) times daily.    . Fingolimod HCl (GILENYA) 0.5 MG CAPS Take 1 capsule (0.5 mg total) by mouth daily. 90 capsule 3  . FLUoxetine  (PROZAC) 20 MG capsule TAKE 1 CAPSULE BY MOUTH EVERY DAY 90 capsule 4  . fluticasone (FLONASE) 50 MCG/ACT nasal spray Place 2 sprays into the nose daily.    Marland Kitchen levothyroxine (SYNTHROID, LEVOTHROID) 100 MCG tablet Take 100 mcg by mouth daily before breakfast.    . LYRICA 75 MG capsule TAKE 1 CAPSULE BY MOUTH TWICE DAILY (Patient taking differently: TAKE 2 CAPSULE BY MOUTH DAILY) 60 capsule 5  . meloxicam (MOBIC) 7.5 MG tablet Take 15 mg by mouth daily.     . modafinil (PROVIGIL) 200 MG tablet TAKE 1 TABLET BY MOUTH EVERY DAY 30 tablet 5  . morphine (KADIAN) 50 MG 24 hr capsule Take 50 mg by mouth 2 (two) times daily.    . Multiple Vitamins-Minerals (MULTIVITAMIN PO) Take by mouth.    . oxyCODONE-acetaminophen (PERCOCET) 5-325 MG per tablet Take 1 tablet by mouth 2 (two) times daily. 60 tablet 0  . pantoprazole (PROTONIX) 40 MG tablet Take 1 tablet (40 mg total) by mouth daily. 30 tablet 1  . polyethylene glycol (MIRALAX / GLYCOLAX) packet Take 17 g by mouth as needed.    Marland Kitchen  promethazine (PHENERGAN) 25 MG tablet Take 1 tablet (25 mg total) by mouth every 6 (six) hours as needed for nausea or vomiting. 30 tablet 2  . Vitamin D, Ergocalciferol, (DRISDOL) 50000 UNITS CAPS capsule Take 50,000 Units by mouth every 7 (seven) days.    Marland Kitchen. amoxicillin (AMOXIL) 500 MG capsule Take 1 capsule (500 mg total) by mouth 3 (three) times daily. (Patient not taking: Reported on 08/24/2014) 21 capsule 0  . methylPREDNISolone sodium succinate (SOLU-MEDROL) 1000 MG injection     . Sodium Chloride Flush (NORMAL SALINE FLUSH) 0.9 % SOLN      Facility-Administered Medications Prior to Visit  Medication Dose Route Frequency Provider Last Rate Last Dose  . methylPREDNISolone sodium succinate (SOLU-MEDROL) 1,000 mg in sodium chloride 0.9 % 100 mL IVPB  1,000 mg Intravenous Continuous Nilda RiggsNancy Carolyn Martin, NP   Stopped at 12/04/13 1020    PAST MEDICAL HISTORY: Past Medical History  Diagnosis Date  . MS (multiple sclerosis)     . Thyroid disease   . Fibromyalgia   . Sleep apnea   . Acid reflux   . Diabetes mellitus without complication     states she was "prediabetic" at one time    PAST SURGICAL HISTORY: Past Surgical History  Procedure Laterality Date  . Hernia repair    . Endometrial biopsy      FAMILY HISTORY: Family History  Problem Relation Age of Onset  . Hypertension Mother   . High blood pressure Father   . Migraines Mother   . Lung cancer Father     SOCIAL HISTORY: History   Social History  . Marital Status: Divorced    Spouse Name: N/A    Number of Children: 1  . Years of Education: college   Occupational History  .      disabled   Social History Main Topics  . Smoking status: Former Smoker    Quit date: 10/01/1988  . Smokeless tobacco: Never Used  . Alcohol Use: No     Comment: Quit alcohol consumption in 1990's  . Drug Use: No  . Sexual Activity: Not on file   Other Topics Concern  . Not on file   Social History Narrative   She lives with her mother, daughter, she is on disability.    Right handed.   Caffeine soda 2 daily      PHYSICAL EXAM  Filed Vitals:   08/24/14 0859  BP: 138/90  Pulse: 97  Temp: 98.6 F (37 C)  TempSrc: Oral  Height: 5\' 4"  (1.626 m)  Weight: 311 lb (141.069 kg)   Body mass index is 53.36 kg/(m^2).  Generalized: Well developed, in no acute distress   Neurological examination  Mentation: Alert oriented to time, place, history taking. Follows all commands speech and language fluent Cranial nerve II-XII: Pupils were equal round reactive to light. Extraocular movements were full, visual field were full on confrontational test. Facial sensation and strength were normal. Uvula tongue midline. Head turning and shoulder shrug  were normal and symmetric. Motor: The motor testing reveals 5 over 5 strength of all 4 extremities. Good symmetric motor tone is noted throughout.  Sensory: Sensory testing is intact to soft touch on all 4  extremities. No evidence of extinction is noted.  Coordination: Cerebellar testing reveals good finger-nose-finger and heel-to-shin bilaterally.  Gait and station: the patient is able to stand from a sitting position without assistance. Her gait is wide-based with a slightly stooped posture. Tandem gait is unsteady. Romberg is negative.  No drift is seen.  Reflexes: Deep tendon reflexes are symmetric and normal bilaterally.    DIAGNOSTIC DATA (LABS, IMAGING, TESTING) - I reviewed patient records, labs, notes, testing and imaging myself where available.  Lab Results  Component Value Date   WBC 4.7 12/04/2013   HGB 10.9* 12/04/2013   HCT 32.9* 12/04/2013   MCV 76* 12/04/2013   PLT 298 08/22/2013      Component Value Date/Time   NA 142 12/04/2013 0923   NA 141 08/22/2013 2139   K 4.4 12/04/2013 0923   CL 104 12/04/2013 0923   CO2 31* 12/04/2013 0923   GLUCOSE 93 12/04/2013 0923   GLUCOSE 133* 08/22/2013 2139   BUN 12 12/04/2013 0923   BUN 14 08/22/2013 2139   CREATININE 0.80 12/04/2013 0923   CALCIUM 8.7 12/04/2013 0923   PROT 6.4 12/04/2013 0923   PROT 6.6 08/22/2013 2139   ALBUMIN 3.5 08/22/2013 2139   AST 17 12/04/2013 0923   ALT 10 12/04/2013 0923   ALKPHOS 87 12/04/2013 0923   BILITOT 0.3 12/04/2013 0923   GFRNONAA 92 12/04/2013 0923   GFRAA 106 12/04/2013 0923    Lab Results  Component Value Date   HGBA1C 5.5 03/16/2013   No results found for: [id number] Lab Results  Component Value Date   TSH 0.138* 03/16/2013      ASSESSMENT AND PLAN 43 y.o. year old female  has a past medical history of MS (multiple sclerosis); Thyroid disease; Fibromyalgia; Sleep apnea; Acid reflux; and Diabetes mellitus without complication. here with:  1. Multiple sclerosis  The patient reports that the numbness in her right hand and right leg at the thigh area has gotten worse over the last month. The patient did have IV steroids for this in March. Her physical exam is unremarkable.  I consulted with Dr. [name] and at this time we do not believe IV steroids are needed. Patient is amenable to this. The patient recently had blood work by her PCP. I have asked her to have those results faxed to our office. The patient will continue taking Gilenya. If her symptoms worsen or she develops new symptoms she should let us know. Otherwise she will follow up in 6 months or sooner if needed.  Butch Penny, MSN, NP-C 08/24/2014, 9:24 AM [facility] [address] [place], [place] [address] [phone]  Note: This document was prepared with digital dictation and possible smart phrase technology. Any transcriptional errors that result from this process are unintentional.

## 2014-08-30 ENCOUNTER — Ambulatory Visit: Payer: Medicare PPO | Admitting: Nurse Practitioner

## 2014-08-31 NOTE — Progress Notes (Signed)
I agree above plan. 

## 2014-09-15 ENCOUNTER — Telehealth: Payer: Self-pay | Admitting: Neurology

## 2014-09-15 NOTE — Telephone Encounter (Signed)
I have reviewed laboratory result in August 13 2014, normal CMP, CBC, TSH was within normal limit, forgot to, T3 was within normal limits 163, free T4 normal 1.5 8, ferritin 57.8, urine drug screen was positive for hydrocodone, methadone, opiates, meperidine

## 2014-09-21 ENCOUNTER — Ambulatory Visit: Payer: Medicare PPO | Admitting: Adult Health

## 2014-09-25 ENCOUNTER — Other Ambulatory Visit: Payer: Self-pay | Admitting: Nurse Practitioner

## 2014-10-08 ENCOUNTER — Encounter: Payer: Self-pay | Admitting: Neurology

## 2015-01-01 ENCOUNTER — Other Ambulatory Visit: Payer: Self-pay

## 2015-01-01 MED ORDER — FINGOLIMOD HCL 0.5 MG PO CAPS: 0.5000 mg | ORAL_CAPSULE | Freq: Every day | ORAL | Status: DC

## 2015-01-19 ENCOUNTER — Encounter: Payer: Self-pay | Admitting: Nurse Practitioner

## 2015-01-19 ENCOUNTER — Ambulatory Visit (INDEPENDENT_AMBULATORY_CARE_PROVIDER_SITE_OTHER): Payer: Medicare PPO | Admitting: Nurse Practitioner

## 2015-01-19 VITALS — BP 147/82 | HR 90 | Ht 64.0 in | Wt 316.0 lb

## 2015-01-19 DIAGNOSIS — G35 Multiple sclerosis: Secondary | ICD-10-CM | POA: Diagnosis not present

## 2015-01-19 DIAGNOSIS — R269 Unspecified abnormalities of gait and mobility: Secondary | ICD-10-CM

## 2015-01-19 NOTE — Patient Instructions (Signed)
Continue Gilenya for relapsing remitting multiple sclerosis Reviewed lab work from primary care 11/10/2014 Follow-up in 6 months next visit with Dr. [name]

## 2015-01-19 NOTE — Progress Notes (Signed)
[facility]  PATIENT: [name] DOB: June 12, 1971   REASON FOR VISIT: follow up for relapsing remitting MS, gait abnormality, obstructive sleep apnea HISTORY FROM: Patient    HISTORY OF PRESENT ILLNESS:[name] is a 44 year old female with a history of multiple sclerosis. She returns today for follow-up. She was last seen by Butch Penny NP on 08/24/2014  She is currently taking Gilenya and tolerating it well. She reports that she is having numbness in the right hand and right leg. She reports that it has been numb there before. She is currently on a combination of Lyrica and Cymbalta. She has history of fibromyalgia. No changes with her gait or balance. 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 Denies any new weakness. No changes in her bowels or bladder. Denies any changes with her vision. She is on CPAP at 13 cm for her obstructive sleep apnea She returns for reevaluation. She has copies of her most recent labs  HISTORY 12/11/13 [name]): [name] is a 44 year-old right-handed [place] American single female from [place], [place], she was diagnosed with multiple sclerosis 10/2004 with positive MRI of the brain and 6 oligoclonal bands in CSF. She was patient of [name]. She was begun on Avonex. 03/12/2006 she had new cervical medullary lesion and was changed to rebif. She is in the EPOC study with Gilenya and began the medication 05/24/10. She is not having any side effects from the medication. She had a normal examination at the [facility] specialists clinic checking for macular edema on 08/17/10. She denies side effects from the medication.She denies Lhermitte's sign or bowel or bladder dysfunction. She has lower back pain and right hip pain that extends to her right upper thigh. It  occurs intermittently. She has pain in the muscles of her arms and legs. She has thyroid disease and thyroid nodules and was followed by [name], endocrinologist but now by [name] who indicates that she has diabetes mellitus. Anti thyroglobulin and thyro peroxidase antibodies have been negative. 05/29/2011 CMP and CBC were normal except for white blood cell count 3800 and hemoglobin 11.5.glucose was 106. 06/25/11 MRI of the brain and cervical spine showed a few scattered supratentorial nonspecific foci of T2 hyperintensity without enhancement and no change versus 04/19/09. There was disc bulging at C5-6 and C6-7 without cord lesions present and no change versus 06/16/08. She has numbness in the right anterior thigh which may be lumbar radiculopathy versus meralgia paresthetica. Her bowel and bladder function are normal. She is independent in her activities of daily living. She has had recurrence of pain in her back and right hip down the right leg.This is made worse by walking and decreased with sitting down. She rates it 6/10.It is also made worse by cold air. Thursday 11/08/11 she felt better but was overtired. She noticed her right leg dragging. 11/13/11, she stood up and noted a lightheaded sensation and hot flash. She noted increased numbness in her legs. She developed foreign accent syndrome in 2012. A course of 3 days of IV Solu-Medrol followed by po prednisone was effective. This was her first attacks since she had begun Gilenya. She is using CPAP At 13 cm of water with ESS 3. She had pulmonary function tests because of decreased breath sounds which were normal.  07/08/2012 she awoke with nausea and shaking of her right hand and arm, and inability to speak. Her right leg "did not want to go". The following day her speech was hesitant but well-formed. She was treated by iv followed by po steroid.  UPDATE June 5th 2014:  Since her last visit in May 20 third, she had multiple phone call to our office, with  constellation of complains, including increased bilateral lower extremity tremor, gait difficulty, continued language difficulty, she is taking baclofen 20 mg 3 times a day, Xanax 0. 2 5 mg twice a day per instruction of on-call physician.  UPDATE July 7th 2014 She presented with progressive weakness of left lower extremity and difficulty with walking as well as progression of tremor affecting right extremities more than left extremities and also affecting her speech. She was admitted for a course of Solu-Medrol intravenously, 500 mg every 12 hours x6 from June 16th to June 18th, which has been very helpful. She wants home physical therapy MRI of brain showed few periventricular and subcortical T2 hyperintensities. Some of these are hypointense on T1 views. No abnormal lesions are seen on post contrast views  UPDATE March 13th 2015: She was seen by [name] in March 3rd, reported that In February 28 she awakened with a numbness in her right leg and right arm and weakness having trouble with her ambulation. She is having problems with her speech. She received five days of iv solumedrol, which did help her symptoms moderately, but she complains of upset stomach, loss of appetite, elevated glucose, mother complains of labile emotion    REVIEW OF SYSTEMS: Full 14 system review of systems performed and notable only for those listed, all others are neg:  Constitutional: Fatigue Cardiovascular: neg Ear/Nose/Throat: neg  Skin: neg Eyes: neg Respiratory: neg Gastroitestinal: neg  Hematology/Lymphatic: neg  Endocrine: Intolerance to heat and cold Musculoskeletal: Back pain walking difficulty Allergy/Immunology: Environmental allergies Neurological: Weakness tremors numbness  Psychiatric: neg Sleep : Obstructive sleep apnea with CPAP   ALLERGIES: No Known Allergies  HOME MEDICATIONS: Outpatient Prescriptions Prior to Visit  Medication Sig Dispense Refill  . ALPRAZolam (XANAX) 0.25 MG tablet  TAKE 1 TABLET BY MOUTH TWICE DAILY 60 tablet 5  . baclofen (LIORESAL) 20 MG tablet TAKE 1 TABLET BY MOUTH TWICE DAILY 180 tablet 4  . cetirizine (ZYRTEC) 10 MG tablet Take 10 mg by mouth daily.    Marland Kitchen docusate sodium (COLACE) 100 MG capsule Take 200 mg by mouth 2 (two) times daily.    . DULoxetine (CYMBALTA) 60 MG capsule Take 60 mg by mouth daily.    . Fingolimod HCl (GILENYA) 0.5 MG CAPS Take 1 capsule (0.5 mg total) by mouth daily. 90 capsule 1  . FLUoxetine (PROZAC) 20 MG capsule TAKE 1 CAPSULE BY MOUTH EVERY DAY 90 capsule 4  . fluticasone (FLONASE) 50 MCG/ACT nasal spray Place 2 sprays into the nose daily.    Marland Kitchen levothyroxine (SYNTHROID, LEVOTHROID) 100 MCG tablet Take 100 mcg by mouth daily before breakfast.    . LYRICA 75 MG capsule TAKE 1 CAPSULE BY MOUTH TWICE DAILY (Patient taking differently: TAKE 2 CAPSULE BY MOUTH DAILY) 60 capsule 5  . meloxicam (MOBIC) 7.5 MG tablet Take 15 mg by mouth daily.     . modafinil (PROVIGIL) 200 MG tablet TAKE 1 TABLET BY MOUTH EVERY DAY 30 tablet 5  . morphine (KADIAN) 50 MG 24 hr capsule Take 50 mg by mouth 2 (two) times daily.    . Multiple Vitamins-Minerals (MULTIVITAMIN  PO) Take by mouth.    . polyethylene glycol (MIRALAX / GLYCOLAX) packet Take 17 g by mouth as needed.    . Vitamin D, Ergocalciferol, (DRISDOL) 50000 UNITS CAPS capsule Take 50,000 Units by mouth every 7 (seven) days.    . pantoprazole (PROTONIX) 40 MG tablet Take 1 tablet (40 mg total) by mouth daily. (Patient not taking: Reported on 01/19/2015) 30 tablet 1  . promethazine (PHENERGAN) 25 MG tablet Take 1 tablet (25 mg total) by mouth every 6 (six) hours as needed for nausea or vomiting. (Patient not taking: Reported on 01/19/2015) 30 tablet 2  . albuterol (PROVENTIL HFA;VENTOLIN HFA) 108 (90 BASE) MCG/ACT inhaler Inhale 1-2 puffs into the lungs every 6 (six) hours as needed for wheezing or shortness of breath. 1 Inhaler 0  . amoxicillin (AMOXIL) 500 MG capsule Take 1 capsule (500 mg  total) by mouth 3 (three) times daily. (Patient not taking: Reported on 08/24/2014) 21 capsule 0  . methylPREDNISolone sodium succinate (SOLU-MEDROL) 1000 MG injection     . oxyCODONE-acetaminophen (PERCOCET) 5-325 MG per tablet Take 1 tablet by mouth 2 (two) times daily. 60 tablet 0  . Sodium Chloride Flush (NORMAL SALINE FLUSH) 0.9 % SOLN      Facility-Administered Medications Prior to Visit  Medication Dose Route Frequency Provider Last Rate Last Dose  . methylPREDNISolone sodium succinate (SOLU-MEDROL) 1,000 mg in sodium chloride 0.9 % 100 mL IVPB  1,000 mg Intravenous Continuous Lynder Parents, NP   Stopped at 12/04/13 1020    PAST MEDICAL HISTORY: Past Medical History  Diagnosis Date  . MS (multiple sclerosis)   . Thyroid disease   . Fibromyalgia   . Sleep apnea   . Acid reflux   . Diabetes mellitus without complication     states she was "prediabetic" at one time    PAST SURGICAL HISTORY: Past Surgical History  Procedure Laterality Date  . Hernia repair    . Endometrial biopsy      FAMILY HISTORY: Family History  Problem Relation Age of Onset  . Hypertension Mother   . High blood pressure Father   . Migraines Mother   . Lung cancer Father     SOCIAL HISTORY: History   Social History  . Marital Status: Divorced    Spouse Name: N/A  . Number of Children: 1  . Years of Education: college   Occupational History  .      disabled   Social History Main Topics  . Smoking status: Former Smoker    Quit date: 10/01/1988  . Smokeless tobacco: Never Used  . Alcohol Use: No     Comment: Quit alcohol consumption in 1990's  . Drug Use: No  . Sexual Activity: Not on file   Other Topics Concern  . Not on file   Social History Narrative   She lives with her mother, daughter, she is on disability.    Right handed.   Education college.   Caffeine soda 2 daily     PHYSICAL EXAM  Filed Vitals:   01/19/15 1027  BP: 147/82  Pulse: 90  Height: 5\' 4"  (1.626 m)   Weight: 316 lb (143.337 kg)   Body mass index is 54.21 kg/(m^2). Generalized: Well developed,  morbidly obese female in no acute distress   Neurological examination  Mentation: Alert oriented to time, place, history taking. Follows all commands speech and language fluent. MMSE 30/30. AFT 20.  Cranial nerve II-XII: Visual acuity 20/30 bilaterally Pupils were equal round reactive to  light. Extraocular movements were full, visual field were full on confrontational test. Facial sensation and strength were normal. Uvula tongue midline. Head turning and shoulder shrug were normal and symmetric. Motor: The motor testing reveals 5 over 5 strength of all 4 extremities. Good symmetric motor tone is noted throughout.  Sensory: Sensory testing is intact to soft touch on all 4 extremities. No evidence of extinction is noted.  Coordination: Cerebellar testing reveals good finger-nose-finger and heel-to-shin bilaterally.  Gait and station: the patient is able to stand from a sitting position without assistance. Her gait is wide-based with a slightly stooped posture. Tandem gait is unsteady. Romberg is negative. No drift is seen.  Reflexes: Deep tendon reflexes are symmetric and normal bilaterally.    DIAGNOSTIC DATA (LABS, IMAGING, TESTING) Reviewed labs from primary care provider from 11/10/2014. Her liver enzymes are in normal range. WBC is 3.5, hemoglobin is 11.1, hematocrit 33.6, MCV 85, lymphocytes 10.1. Neutrophils 80.2%. Lab work is ok.  ASSESSMENT AND PLAN  44 y.o. year old female  has a past medical history of MS (multiple sclerosis);  Fibromyalgia; Sleep apnea; gait abnormality here to follow-up.  Continue Gilenya for relapsing remitting multiple sclerosis Reviewed lab work from primary care 11/10/2014 Follow-up in 6 months next visit with Dr. Carmelina Noun, [facility], APRN  [facility] [address] [place], [place] [address] [phone] [name]  discussing got 2 more dictations today

## 2015-01-20 NOTE — Progress Notes (Signed)
I have reviewed and agreed above plan. 

## 2015-01-31 ENCOUNTER — Telehealth: Payer: Self-pay | Admitting: *Deleted

## 2015-01-31 NOTE — Telephone Encounter (Signed)
Collected 01/06/15 at [facility] [phone]   CBC W/ DIFF:  WBC: 3.9 HGB: 10.7 HCT: 34.7 MCV: 87 PLT: 230

## 2015-02-11 ENCOUNTER — Other Ambulatory Visit: Payer: Self-pay | Admitting: Neurology

## 2015-02-14 ENCOUNTER — Telehealth: Payer: Self-pay | Admitting: *Deleted

## 2015-02-14 NOTE — Telephone Encounter (Signed)
RX for Xanax faxed and confirmed to [facility] in [place] - [phone].  Pt aware.

## 2015-02-16 ENCOUNTER — Other Ambulatory Visit: Payer: Self-pay | Admitting: Neurology

## 2015-02-17 ENCOUNTER — Telehealth: Payer: Self-pay | Admitting: *Deleted

## 2015-02-17 NOTE — Telephone Encounter (Signed)
Rx for modafinil faxed to [facility] at [phone].

## 2015-02-23 ENCOUNTER — Ambulatory Visit: Payer: Medicare PPO | Admitting: Nurse Practitioner

## 2015-03-11 ENCOUNTER — Other Ambulatory Visit: Payer: Self-pay | Admitting: Neurology

## 2015-03-17 ENCOUNTER — Telehealth: Payer: Self-pay | Admitting: *Deleted

## 2015-03-17 NOTE — Telephone Encounter (Signed)
Patient form on Michelle desk. 

## 2015-03-18 DIAGNOSIS — Z0289 Encounter for other administrative examinations: Secondary | ICD-10-CM

## 2015-03-24 ENCOUNTER — Telehealth: Payer: Self-pay | Admitting: *Deleted

## 2015-03-24 NOTE — Telephone Encounter (Signed)
I mailed the patient a copy of her attending physician form.

## 2015-05-04 ENCOUNTER — Other Ambulatory Visit: Payer: Self-pay | Admitting: Neurology

## 2015-07-17 ENCOUNTER — Emergency Department (HOSPITAL_COMMUNITY)
Admission: EM | Admit: 2015-07-17 | Discharge: 2015-07-17 | Disposition: A | Discharge status: Home / Self Care | Payer: Medicare PPO

## 2015-07-21 ENCOUNTER — Encounter: Payer: Self-pay | Admitting: Neurology

## 2015-07-21 ENCOUNTER — Ambulatory Visit (INDEPENDENT_AMBULATORY_CARE_PROVIDER_SITE_OTHER): Payer: Medicare PPO | Admitting: Neurology

## 2015-07-21 VITALS — BP 148/92 | HR 88 | Ht 64.0 in | Wt 317.0 lb

## 2015-07-21 DIAGNOSIS — G35 Multiple sclerosis: Secondary | ICD-10-CM

## 2015-07-21 DIAGNOSIS — R269 Unspecified abnormalities of gait and mobility: Secondary | ICD-10-CM

## 2015-07-21 NOTE — Progress Notes (Addendum)
[facility]  PATIENT: [name] DOB: January 27, 1971   REASON FOR VISIT: follow up for relapsing remitting MS, gait abnormality, obstructive sleep apnea HISTORY FROM: Patient    HISTORY OF PRESENT ILLNESS  HISTORY 12/11/13 [name]): [name] is a 44 year-old right-handed [place] American single female from [place], [place], she was diagnosed with multiple sclerosis 10/2004 with positive MRI of the brain and 6 oligoclonal bands in CSF. She was patient of Dr. Sandria George. She was begun on Avonex. 03/12/2006 she had new cervical medullary lesion and was changed to rebif. She is in the EPOC study with Gilenya and began the medication 05/24/10. She is not having any side effects from the medication. She had a normal examination at the [facility] specialists clinic checking for macular edema on 08/17/10.  .  06/25/11 MRI of the brain and cervical spine showed a few scattered supratentorial nonspecific foci of T2 hyperintensity without enhancement and no change versus 04/19/09. There was disc bulging at C5-6 and C6-7 without cord lesions present and no change versus 06/16/08.   She also has hypothyroidism, diabetes, obstructive sleep apnea She is using CPAP At 13 cm of water with ESS 3. She had pulmonary function tests because of decreased breath sounds which were normal. 07/08/2012 she awoke with nausea and shaking of her right hand and arm, and inability to speak. Her right leg "did not want to go". The following day her speech was hesitant but well-formed. She was treated by iv followed by po steroid.   UPDATE June 5th 2014:  Since her last visit in May 20 third, she had multiple phone call to our office, with constellation of complains, including increased bilateral lower extremity tremor, gait difficulty, continued language difficulty, she is taking baclofen 20 mg 3 times a day, Xanax 0. 2 5 mg twice a day per instruction of on-call physician.   UPDATE July 7th 2014  She presented with progressive weakness of left lower extremity and difficulty with walking as well as progression of tremor affecting right extremities more than left extremities and also affecting her speech. She was admitted for a course of Solu-Medrol intravenously, 500 mg every 12 hours x6 from June 16th to June 18th, which has been very helpful. She wants home physical therapy MRI of brain showed few periventricular and subcortical T2 hyperintensities. Some of these are hypointense on T1 views. No abnormal lesions are seen on post contrast views    She was seen by [name] in March 3rd, reported that In February 28 she awakened with a numbness in her right leg and right arm and weakness having trouble with her ambulation. She is having problems with her speech. She received five days of iv solumedrol, which did help her symptoms moderately, but she complains of upset stomach, loss of appetite, elevated glucose, mother complains of labile emotion  UPDATE Jul 21 2015:  I have personally reviewed MRI of the brain with and without contrast in June 2015: Only mild supratentorium nonspecific gliosis,, no lesions in MRI of cervical spine  She continue complaining intermittent foreign language accent, she is continue taking Gilenya, I have discussed her with multiple times about possibility of stopping immunomodulation therapy, but patient is very resistant with any changes.  She also has intermittent worsening low back pain, gait difficulty,  REVIEW OF SYSTEMS: Full 14 system review of systems performed and notable only for those listed, all others are neg: Fatigue, cold intolerance, heat intolerance, apnea, back pain, achy muscles, numbness, speech  difficulty, weakness, anxiety     ALLERGIES: No Known Allergies  HOME MEDICATIONS: Outpatient Prescriptions Prior to Visit  Medication Sig Dispense Refill  . ALPRAZolam (XANAX) 0.25 MG tablet TAKE 1 TABLET BY MOUTH TWICE DAILY 60 tablet 5  .  baclofen (LIORESAL) 20 MG tablet TAKE 1 TABLET BY MOUTH 3 TIMES A DAY 90 tablet 11  . cetirizine (ZYRTEC) 10 MG tablet Take 10 mg by mouth daily.    Marland Kitchen docusate sodium (COLACE) 100 MG capsule Take 200 mg by mouth 2 (two) times daily.    . DULoxetine (CYMBALTA) 60 MG capsule Take 60 mg by mouth daily.    . Fingolimod HCl (GILENYA) 0.5 MG CAPS Take 1 capsule (0.5 mg total) by mouth daily. 90 capsule 1  . FLUoxetine (PROZAC) 20 MG capsule TAKE ONE CAPSULE BY MOUTH EVERY DAY 90 capsule 4  . fluticasone (FLONASE) 50 MCG/ACT nasal spray Place 2 sprays into the nose daily.    Marland Kitchen levothyroxine (SYNTHROID, LEVOTHROID) 100 MCG tablet Take 100 mcg by mouth daily before breakfast.    . LYRICA 75 MG capsule TAKE 1 CAPSULE BY MOUTH TWICE DAILY (Patient taking differently: TAKE 2 CAPSULE BY MOUTH DAILY) 60 capsule 5  . meloxicam (MOBIC) 7.5 MG tablet Take 15 mg by mouth daily.     . modafinil (PROVIGIL) 200 MG tablet TAKE 1 TABLET BY MOUTH EVERY DAY 30 tablet 5  . morphine (KADIAN) 50 MG 24 hr capsule Take 50 mg by mouth 2 (two) times daily.    . Multiple Vitamins-Minerals (MULTIVITAMIN PO) Take by mouth.    . oxyCODONE-acetaminophen (PERCOCET) 7.5-325 MG per tablet Take 1 tablet by mouth 2 (two) times daily.    . pantoprazole (PROTONIX) 40 MG tablet Take 1 tablet (40 mg total) by mouth daily. (Patient not taking: Reported on 01/19/2015) 30 tablet 1  . polyethylene glycol (MIRALAX / GLYCOLAX) packet Take 17 g by mouth as needed.    . promethazine (PHENERGAN) 25 MG tablet Take 1 tablet (25 mg total) by mouth every 6 (six) hours as needed for nausea or vomiting. (Patient not taking: Reported on 01/19/2015) 30 tablet 2  . Vitamin D, Ergocalciferol, (DRISDOL) 50000 UNITS CAPS capsule Take 50,000 Units by mouth every 7 (seven) days.     Facility-Administered Medications Prior to Visit  Medication Dose Route Frequency Provider Last Rate Last Dose  . methylPREDNISolone sodium succinate (SOLU-MEDROL) 1,000 mg in sodium  chloride 0.9 % 100 mL IVPB  1,000 mg Intravenous Continuous Brooke Riggs, NP   Stopped at 12/04/13 1020    PAST MEDICAL HISTORY: Past Medical History  Diagnosis Date  . MS (multiple sclerosis)   . Thyroid disease   . Fibromyalgia   . Sleep apnea   . Acid reflux   . Diabetes mellitus without complication     states she was "prediabetic" at one time    PAST SURGICAL HISTORY: Past Surgical History  Procedure Laterality Date  . Hernia repair    . Endometrial biopsy      FAMILY HISTORY: Family History  Problem Relation Age of Onset  . Hypertension Mother   . High blood pressure Father   . Migraines Mother   . Lung cancer Father     SOCIAL HISTORY: Social History   Social History  . Marital Status: Divorced    Spouse Name: N/A  . Number of Children: 1  . Years of Education: college   Occupational History  .      disabled   Social  History Main Topics  . Smoking status: Former Smoker    Quit date: 10/01/1988  . Smokeless tobacco: Never Used  . Alcohol Use: No     Comment: Quit alcohol consumption in 1990's  . Drug Use: No  . Sexual Activity: Not on file   Other Topics Concern  . Not on file   Social History Narrative   She lives with her mother, daughter, she is on disability.    Right handed.   Education college.   Caffeine soda 2 daily     PHYSICAL EXAM  There were no vitals filed for this visit. There is no weight on file to calculate BMI.  PHYSICAL EXAMNIATION:  Gen: NAD, conversant, well nourised, obese, well groomed                     Cardiovascular: Regular rate rhythm, no peripheral edema, warm, nontender. Eyes: Conjunctivae clear without exudates or hemorrhage Neck: Supple, no carotid bruise. Pulmonary: Clear to auscultation bilaterally   NEUROLOGICAL EXAM:  MENTAL STATUS: Speech:    Speech is normal; fluent and spontaneous with normal comprehension. She talked with an accent Cognition:     Orientation to time, place and  person     Normal recent and remote memory     Normal Attention span and concentration     Normal Language, naming, repeating,spontaneous speech     Fund of knowledge   CRANIAL NERVES: CN II: Visual fields are full to confrontation. Fundoscopic exam is normal with sharp discs and no vascular changes. Pupils are round equal and briskly reactive to light. CN III, IV, VI: extraocular movement are normal. No ptosis. CN V: Facial sensation is intact to pinprick in all 3 divisions bilaterally. Corneal responses are intact.  CN VII: Face is symmetric with normal eye closure and smile. CN VIII: Hearing is normal to rubbing fingers CN IX, X: Palate elevates symmetrically. Phonation is normal. CN XI: Head turning and shoulder shrug are intact CN XII: Tongue is midline with normal movements and no atrophy.  MOTOR: There is no pronator drift of out-stretched arms. Muscle bulk and tone are normal. Muscle strength is normal.  REFLEXES: Reflexes are 2+ and symmetric at the biceps, triceps, knees, and ankles. Plantar responses are flexor.  SENSORY: Intact to light touch, pinprick, position sense, and vibration sense are intact in fingers and toes.  COORDINATION: Rapid alternating movements and fine finger movements are intact. There is no dysmetria on finger-to-nose and heel-knee-shin.    GAIT/STANCE: She needs to push up to get up from seated position, cautious, mildly unsteady  ASSESSMENT AND PLAN  44 y.o. year old female   Possible relapsing remitting multiple sclerosis  Continue Gilenya, have discussed with her multiple times about the potential start taking long term immunomodulation therapy, she is very resistant to the changes.  Laboratory result from primary care physician Gait difficulty:  Multifactorial, includes her obesity, low back pain, joints pain.   Levert Feinstein, M.D. Ph.D.  [facility] [address] [place], [place] [address] Phone: [phone] Fax:       [phone]   Addendum: I reviewed laboratory evaluation in July 19 2015, normal T3, TSH, B12 506, mildly low vitamin D 24.7, normal PTH, A1c was 6.1, cholesterol 198, LDL 124, CBC, WBC was low 3.9, hemoglobin 11, Lymphocyte 8%, is low, consistent with her Gilenya use. Neutrophil was 80.6%  normal CMP, ferritin level was 64, folic acid was 15. 4, mildly low iron 39, TIBC was high 444

## 2015-07-22 ENCOUNTER — Other Ambulatory Visit: Payer: Self-pay

## 2015-07-22 MED ORDER — FINGOLIMOD HCL 0.5 MG PO CAPS
0.5000 mg | ORAL_CAPSULE | Freq: Every day | ORAL | Status: DC
Start: 2015-07-22 — End: 2016-01-09

## 2015-07-26 NOTE — ED Notes (Signed)
I WAS IN CHART CONFIRMING PT WAS NOT HERE BUT WAS PUT IN BY MISTAKE.

## 2015-09-05 ENCOUNTER — Telehealth: Payer: Self-pay | Admitting: Neurology

## 2015-09-05 NOTE — Telephone Encounter (Signed)
Spoke to patient - placed on Megan's schedule for evaluation.  Patient is currently on Gilenya therapy.

## 2015-09-05 NOTE — Telephone Encounter (Signed)
Patient called to advise she is having a really bad MS relapse, having trouble walking and tremors are bad, body just gives out, body goes to sleep.

## 2015-09-06 ENCOUNTER — Ambulatory Visit (INDEPENDENT_AMBULATORY_CARE_PROVIDER_SITE_OTHER): Payer: Medicare PPO | Admitting: Adult Health

## 2015-09-06 ENCOUNTER — Encounter: Payer: Self-pay | Admitting: Adult Health

## 2015-09-06 VITALS — BP 128/84 | Ht 64.0 in | Wt 320.0 lb

## 2015-09-06 DIAGNOSIS — G35 Multiple sclerosis: Secondary | ICD-10-CM | POA: Diagnosis not present

## 2015-09-06 DIAGNOSIS — R251 Tremor, unspecified: Secondary | ICD-10-CM | POA: Diagnosis not present

## 2015-09-06 NOTE — Progress Notes (Addendum)
PATIENT: [name] DOB: 1971-05-06  REASON FOR VISIT: follow up-  Multiple sclerosis HISTORY FROM: patient  HISTORY OF PRESENT ILLNESS: [name]  Is a 44 year old female with a history of multiple sclerosis. She  is currently on Gilenya. She returns today complaining of   Worsening symptoms. 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 And when her speech changes she knows that her MS is acting up. Today she is having a hard time speaking- stuttering with an accent as well. She has numbness in the hands that can occur when sitting but usually resolve spontaneously. This has been occurring for some time according to the patient. She denies any significant changes in her gait and balance. She continues to use a cane when ambulating. She does have a rolling walker at home if needed. She reports that she does have weakness on the right side that has been ongoing but she does feel that lately it has gotten slightly worse. She states that it was like this at the last visit though. She is currently on Gilenya. Denies any changes with her vision. She does state that sometimes when she sitting she feels that her body gives out -she gives the example that she feels that the "battery dies."  Patient is also reporting a tremor in the upper body. She states that this is relatively new. She reports that it occurs intermittently.She returns today for an evaluation.  HISTORY 12/11/13 [name]): [name] is a 44 year-old right-handed [place] American single female from [place], [place], she was diagnosed with multiple sclerosis 10/2004 with positive MRI of the brain and 6 oligoclonal bands in CSF. She was patient of Dr. Sandria Manly. She was begun on Avonex. 03/12/2006 she had new cervical medullary lesion and was changed to rebif. She is in the EPOC study with Gilenya and began the medication 05/24/10. She is not having any side  effects from the medication. She had a normal examination at the [facility] clinic checking for macular edema on 08/17/10.  .  06/25/11 MRI of the brain and cervical spine showed a few scattered supratentorial nonspecific foci of T2 hyperintensity without enhancement and no change versus 04/19/09. There was disc bulging at C5-6 and C6-7 without cord lesions present and no change versus 06/16/08.   She also has hypothyroidism, diabetes, obstructive sleep apnea She is using CPAP At 13 cm of water with ESS 3. She had pulmonary function tests because of decreased breath sounds which were normal. 07/08/2012 she awoke with nausea and shaking of her right hand and arm, and inability to speak. Her right leg "did not want to go". The following day her speech was hesitant but well-formed. She was treated by iv followed by po steroid.   UPDATE June 5th 2014:  Since her last visit in May 20 third, she had multiple phone call to our office, with constellation of complains, including increased bilateral lower extremity tremor, gait difficulty, continued language difficulty, she is taking baclofen 20 mg 3 times a day, Xanax 0. 2 5 mg twice a day per instruction of on-call physician.   UPDATE July 7th 2014 She presented with progressive weakness of left lower extremity and difficulty with walking as well as progression of tremor affecting right extremities more than left extremities and also affecting her speech. She was admitted for a course of Solu-Medrol intravenously, 500 mg every 12 hours x6 from June 16th to June  18th, which has been very helpful. She wants home physical therapy MRI of brain showed few periventricular and subcortical T2 hyperintensities. Some of these are hypointense on T1 views. No abnormal lesions are seen on post contrast views   She was seen by [name] in March 3rd, reported that In February 28 she awakened with a numbness in her right leg and right arm and weakness having  trouble with her ambulation. She is having problems with her speech. She received five days of iv solumedrol, which did help her symptoms moderately, but she complains of upset stomach, loss of appetite, elevated glucose, mother complains of labile emotion  UPDATE Jul 21 2015:  I have personally reviewed MRI of the brain with and without contrast in June 2015: Only mild supratentorium nonspecific gliosis,, no lesions in MRI of cervical spine  She continue complaining intermittent foreign language accent, she is continue taking Gilenya, I have discussed her with multiple times about possibility of stopping immunomodulation therapy, but patient is very resistant with any changes.  She also has intermittent worsening low back pain, gait difficulty,  REVIEW OF SYSTEMS: Out of a complete 14 system review of symptoms, the patient complains only of the following symptoms, and all other reviewed systems are negative.   see history of present illness  ALLERGIES: No Known Allergies  HOME MEDICATIONS: Outpatient Prescriptions Prior to Visit  Medication Sig Dispense Refill  . ALPRAZolam (XANAX) 0.25 MG tablet TAKE 1 TABLET BY MOUTH TWICE DAILY 60 tablet 5  . amLODipine (NORVASC) 5 MG tablet daily.  0  . baclofen (LIORESAL) 20 MG tablet TAKE 1 TABLET BY MOUTH 3 TIMES A DAY 90 tablet 11  . cetirizine (ZYRTEC) 10 MG tablet Take 10 mg by mouth daily.    . clotrimazole-betamethasone (LOTRISONE) cream as needed.  0  . docusate sodium (COLACE) 100 MG capsule Take 200 mg by mouth 2 (two) times daily.    . DULoxetine (CYMBALTA) 60 MG capsule Take 60 mg by mouth daily.    . Fingolimod HCl (GILENYA) 0.5 MG CAPS Take 1 capsule (0.5 mg total) by mouth daily. 90 capsule 1  . FLUoxetine (PROZAC) 20 MG capsule TAKE ONE CAPSULE BY MOUTH EVERY DAY 90 capsule 4  . fluticasone (FLONASE) 50 MCG/ACT nasal spray Place 2 sprays into the nose daily.    Marland Kitchen levothyroxine (SYNTHROID, LEVOTHROID) 100 MCG tablet Take 100 mcg by  mouth daily before breakfast.    . LYRICA 75 MG capsule TAKE 1 CAPSULE BY MOUTH TWICE DAILY (Patient taking differently: TAKE 2 CAPSULE BY MOUTH DAILY) 60 capsule 5  . meloxicam (MOBIC) 7.5 MG tablet Take 15 mg by mouth daily.     . modafinil (PROVIGIL) 200 MG tablet TAKE 1 TABLET BY MOUTH EVERY DAY 30 tablet 5  . morphine (KADIAN) 50 MG 24 hr capsule Take 50 mg by mouth 2 (two) times daily.    . Multiple Vitamins-Minerals (MULTIVITAMIN PO) Take by mouth.    . oxyCODONE-acetaminophen (PERCOCET) 7.5-325 MG per tablet Take 1 tablet by mouth 2 (two) times daily.    . pantoprazole (PROTONIX) 40 MG tablet Take 1 tablet (40 mg total) by mouth daily. 30 tablet 1  . polyethylene glycol (MIRALAX / GLYCOLAX) packet Take 17 g by mouth as needed.    . promethazine (PHENERGAN) 25 MG tablet Take 1 tablet (25 mg total) by mouth every 6 (six) hours as needed for nausea or vomiting. 30 tablet 2   No facility-administered medications prior to visit.  PAST MEDICAL HISTORY: Past Medical History  Diagnosis Date  . MS (multiple sclerosis) (HCC)   . Thyroid disease   . Fibromyalgia   . Sleep apnea   . Acid reflux   . Diabetes mellitus without complication (HCC)     states she was "prediabetic" at one time    PAST SURGICAL HISTORY: Past Surgical History  Procedure Laterality Date  . Hernia repair    . Endometrial biopsy      FAMILY HISTORY: Family History  Problem Relation Age of Onset  . Hypertension Mother   . High blood pressure Father   . Migraines Mother   . Lung cancer Father     SOCIAL HISTORY: Social History   Social History  . Marital Status: Divorced    Spouse Name: N/A  . Number of Children: 1  . Years of Education: college   Occupational History  .      disabled   Social History Main Topics  . Smoking status: Former Smoker    Quit date: 10/01/1988  . Smokeless tobacco: Never Used  . Alcohol Use: No     Comment: Quit alcohol consumption in 1990's  . Drug Use: No  .  Sexual Activity: Not on file   Other Topics Concern  . Not on file   Social History Narrative   She lives with her mother, daughter, she is on disability.    Right handed.   Education college.   Caffeine soda 2 daily      PHYSICAL EXAM  Filed Vitals:   09/06/15 1313  BP: 128/84  Height:  (1.626 m)  Weight: 320 lb (145.151 kg)   Body mass index is 54.9 kg/(m^2).  Generalized: Well developed, in no acute distress   Neurological examination  Mentation: Alert oriented to time, place, history taking. Follows all commands,  Stuttering speech with an accent. Cranial nerve II-XII: Pupils were equal round reactive to light. Extraocular movements were full, visual field were full on confrontational test. Facial sensation and strength were normal. Uvula tongue midline. Head turning and shoulder shrug  were normal and symmetric. Motor: The motor testing reveals 5 over 5 strength of all 4 extremities. Good symmetric motor tone is noted throughout.  Upper body tremor noted intermittently. Sensory: Sensory testing is intact to soft touch on all 4 extremities. No evidence of extinction is noted.  Coordination: Cerebellar testing reveals good finger-nose-finger and heel-to-shin bilaterally. Initially she had a tremor in the right hand but when finger nose finger was tested later in the exam it was not present. Gait and station:  Patient uses a cane when in ambulating. Gait is slightly unsteady and cautious. Reflexes: Deep tendon reflexes are symmetric but depressed throughout  DIAGNOSTIC DATA (LABS, IMAGING, TESTING) - I reviewed patient records, labs, notes, testing and imaging myself where available.  Lab Results  Component Value Date   WBC 4.7 12/04/2013   HGB 10.9* 12/04/2013   HCT 32.9* 12/04/2013   MCV 76* 12/04/2013   PLT 298 08/22/2013      Component Value Date/Time   NA 142 12/04/2013 0923   NA 141 08/22/2013 2139   K 4.4 12/04/2013 0923   CL 104 12/04/2013 0923   CO2  31* 12/04/2013 0923   GLUCOSE 93 12/04/2013 0923   GLUCOSE 133* 08/22/2013 2139   BUN 12 12/04/2013 0923   BUN 14 08/22/2013 2139   CREATININE 0.80 12/04/2013 0923   CALCIUM 8.7 12/04/2013 0923   PROT 6.4 12/04/2013 0923   PROT  6.6 08/22/2013 2139   ALBUMIN 4.1 12/04/2013 0923   ALBUMIN 3.5 08/22/2013 2139   AST 17 12/04/2013 0923   ALT 10 12/04/2013 0923   ALKPHOS 87 12/04/2013 0923   BILITOT 0.3 12/04/2013 0923   GFRNONAA 92 12/04/2013 0923   GFRAA 106 12/04/2013 0923    ASSESSMENT AND PLAN 44 y.o. year old female  has a past medical history of MS (multiple sclerosis) (HCC); Thyroid disease; Fibromyalgia; Sleep apnea; Acid reflux; and Diabetes mellitus without complication (HCC). here with:   1. Multiple sclerosis 2. Tremor  Patient feels that her symptoms have worsened. She is most concerned about a tremor that affects the upper body. However on exam the tremor is intermittent and appears to inconsistent. The patient reports numbness in the hands as well as increased weakness on the right side. We will repeat MRI of the brain and cervical spine to look for progression of her multiple sclerosis. Patient is amenable to this plan. She will keep her follow-up in April.    Butch Penny, MSN, NP-C 09/06/2015, 1:08 PM [facility] [address] [place], [place] [address] [phone]

## 2015-09-06 NOTE — Patient Instructions (Signed)
Continue Gilenya  We will order MRI brain and spine If your symptoms worsen or you develop new symptoms please let us know.

## 2015-09-06 NOTE — Progress Notes (Signed)
I have reviewed and agreed above plan. 

## 2015-09-08 ENCOUNTER — Emergency Department (HOSPITAL_COMMUNITY): Payer: Medicare PPO

## 2015-09-08 ENCOUNTER — Telehealth: Payer: Self-pay | Admitting: Adult Health

## 2015-09-08 ENCOUNTER — Encounter (HOSPITAL_COMMUNITY): Payer: Self-pay | Admitting: Emergency Medicine

## 2015-09-08 ENCOUNTER — Emergency Department (HOSPITAL_COMMUNITY)
Admission: EM | Admit: 2015-09-08 | Discharge: 2015-09-08 | Disposition: A | Discharge status: Home / Self Care | Payer: Medicare PPO | Attending: Emergency Medicine | Admitting: Emergency Medicine

## 2015-09-08 DIAGNOSIS — E079 Disorder of thyroid, unspecified: Secondary | ICD-10-CM | POA: Diagnosis not present

## 2015-09-08 DIAGNOSIS — R4789 Other speech disturbances: Secondary | ICD-10-CM | POA: Insufficient documentation

## 2015-09-08 DIAGNOSIS — M797 Fibromyalgia: Secondary | ICD-10-CM | POA: Insufficient documentation

## 2015-09-08 DIAGNOSIS — R4701 Aphasia: Secondary | ICD-10-CM | POA: Diagnosis present

## 2015-09-08 DIAGNOSIS — Z7952 Long term (current) use of systemic steroids: Secondary | ICD-10-CM | POA: Insufficient documentation

## 2015-09-08 DIAGNOSIS — Z7951 Long term (current) use of inhaled steroids: Secondary | ICD-10-CM | POA: Diagnosis not present

## 2015-09-08 DIAGNOSIS — G35 Multiple sclerosis: Secondary | ICD-10-CM | POA: Diagnosis not present

## 2015-09-08 DIAGNOSIS — F8081 Childhood onset fluency disorder: Secondary | ICD-10-CM | POA: Diagnosis not present

## 2015-09-08 DIAGNOSIS — Z79899 Other long term (current) drug therapy: Secondary | ICD-10-CM | POA: Insufficient documentation

## 2015-09-08 DIAGNOSIS — R479 Unspecified speech disturbances: Secondary | ICD-10-CM

## 2015-09-08 DIAGNOSIS — E119 Type 2 diabetes mellitus without complications: Secondary | ICD-10-CM | POA: Diagnosis not present

## 2015-09-08 DIAGNOSIS — K219 Gastro-esophageal reflux disease without esophagitis: Secondary | ICD-10-CM | POA: Insufficient documentation

## 2015-09-08 DIAGNOSIS — Z79891 Long term (current) use of opiate analgesic: Secondary | ICD-10-CM | POA: Insufficient documentation

## 2015-09-08 LAB — URINE MICROSCOPIC-ADD ON

## 2015-09-08 LAB — CBC
HCT: 35.5 % — ABNORMAL LOW (ref 36.0–46.0)
Hemoglobin: 11.3 g/dL — ABNORMAL LOW (ref 12.0–15.0)
MCH: 26.8 pg (ref 26.0–34.0)
MCHC: 31.8 g/dL (ref 30.0–36.0)
MCV: 84.1 fL (ref 78.0–100.0)
Platelets: 232 10*3/uL (ref 150–400)
RBC: 4.22 MIL/uL (ref 3.87–5.11)
RDW: 13.4 % (ref 11.5–15.5)
WBC: 5.9 10*3/uL (ref 4.0–10.5)

## 2015-09-08 LAB — URINALYSIS, ROUTINE W REFLEX MICROSCOPIC
GLUCOSE, UA: NEGATIVE mg/dL
Ketones, ur: NEGATIVE mg/dL
NITRITE: NEGATIVE
PROTEIN: NEGATIVE mg/dL
Specific Gravity, Urine: 1.026 (ref 1.005–1.030)
pH: 5 (ref 5.0–8.0)

## 2015-09-08 LAB — BASIC METABOLIC PANEL
Anion gap: 8 (ref 5–15)
BUN: 14 mg/dL (ref 6–20)
CO2: 29 mmol/L (ref 22–32)
Calcium: 9 mg/dL (ref 8.9–10.3)
Chloride: 103 mmol/L (ref 101–111)
Creatinine, Ser: 0.92 mg/dL (ref 0.44–1.00)
GFR calc Af Amer: >60 mL/min (ref >60)
Glucose, Bld: 166 mg/dL — ABNORMAL HIGH (ref 65–99)
Potassium: 3.8 mmol/L (ref 3.5–5.1)
Sodium: 140 mmol/L (ref 135–145)

## 2015-09-08 MED ORDER — SODIUM CHLORIDE 0.9 % IV SOLN
1000.0000 mg | Freq: Once | INTRAVENOUS | Status: AC
  Administered 2015-09-08: 1000 mg via INTRAVENOUS
  Filled 2015-09-08: qty 8

## 2015-09-08 MED ORDER — OXYCODONE-ACETAMINOPHEN 5-325 MG PO TABS
1.0000 | ORAL_TABLET | Freq: Once | ORAL | Status: AC
  Administered 2015-09-08: 1 via ORAL
  Filled 2015-09-08: qty 1

## 2015-09-08 MED ORDER — ONDANSETRON HCL 4 MG/2ML IJ SOLN
4.0000 mg | Freq: Once | INTRAMUSCULAR | Status: AC
  Administered 2015-09-08: 4 mg via INTRAVENOUS
  Filled 2015-09-08: qty 2

## 2015-09-08 NOTE — Consult Note (Signed)
Consult Reason for Consult: stuttering speech and right sided tremor Referring Physician: Dr Juleen China  CC: change in speech  HPI: [name] is an 44 y.o. female hx of MS, fibromyalgia presenting with concerns over her speech and right sided tremor. Symptoms started 1 week ago. She also reports sensation that her mind is racing and is going too fast. Having trouble finding the correct words. Having stuttering speech, difficulty getting the words out. She also notes right sided weakness mainly involving the right upper extremity. She was diagnosed with MS in 2006 by Dr Sandria Manly. Currently on Gilenya. Has been told by Dr Sandria Manly that she has foreign accent syndrome.   MRI brain and C spine in the ED was reviewed, no evidence of acute infarct or active demyelination, noted new possible right frontal plaque. (of note this was completed without contrast)  Past Medical History  Diagnosis Date  . MS (multiple sclerosis) (HCC)   . Thyroid disease   . Fibromyalgia   . Sleep apnea   . Acid reflux   . Diabetes mellitus without complication (HCC)     states she was "prediabetic" at one time    Past Surgical History  Procedure Laterality Date  . Hernia repair    . Endometrial biopsy      Family History  Problem Relation Age of Onset  . Hypertension Mother   . High blood pressure Father   . Migraines Mother   . Lung cancer Father     Social History:  reports that she quit smoking about 26 years ago. She has never used smokeless tobacco. She reports that she does not drink alcohol or use illicit drugs.  No Known Allergies  Medications: I have reviewed the patient's current medications.  ROS: Out of a complete 14 system review, the patient complains of only the following symptoms, and all other reviewed systems are negative. +weakness, speech deficits  Physical Examination: Filed Vitals:   09/08/15 1812 09/08/15 1815  BP: 114/64 121/79  Pulse: 87 88  Temp:    Resp: 18     Physical Exam  Constitutional: He appears well-developed and well-nourished.  Psych: Affect appropriate to situation Eyes: No scleral injection HENT: No OP obstrucion Head: Normocephalic.  Cardiovascular: Normal rate and regular rhythm.  Respiratory: Effort normal and breath sounds normal.  GI: Soft. Bowel sounds are normal. No distension. There is no tenderness.  Skin: WDI  Neurologic Examination Mental Status: Alert, oriented, thought content appropriate.  Stuttering speech, slow speech but no signs of aphasia. Dysarthria is inconsistent and improves throughout the exam.  Able to follow 3 step commands without difficulty. Cranial Nerves: II: funduscopic exam wnl bilaterally, visual fields grossly normal, pupils equal, round, reactive to light and accommodation III,IV, VI: ptosis not present, extra-ocular motions intact bilaterally V,VII: smile symmetric, facial light touch sensation normal bilaterally VIII: hearing normal bilaterally IX,X: gag reflex present XI: trapezius strength/neck flexion strength normal bilaterally XII: tongue strength normal  Motor: Right : Upper extremity    Left:     Upper extremity 5/5 deltoid       5/5 deltoid 5/5 biceps      5/5 biceps  5/5 triceps      5/5 triceps 5/5 hand grip      5/5 hand grip  Lower extremity     Lower extremity 5/5 hip flexor      5/5 hip flexor 5/5 quadricep      5/5 quadriceps  5/5 hamstrings     5/5 hamstrings 5/5  plantar flexion       5/5 plantar flexion 5/5 plantar extension     5/5 plantar extension Tone and bulk:normal tone throughout; no atrophy noted Sensory: Pinprick and light touch intact throughout, bilaterally Deep Tendon Reflexes: 2+ and symmetric throughout Plantars: Right: downgoing   Left: downgoing Cerebellar: Mild resting and postural tremor of RUE though it is intermittent and resolves with distraction.  Gait: deferred  Laboratory Studies:   Basic Metabolic Panel:  Recent Labs Lab  09/08/15 1138  NA 140  K 3.8  CL 103  CO2 29  GLUCOSE 166*  BUN 14  CREATININE 0.92  CALCIUM 9.0    Liver Function Tests: No results for input(s): AST, ALT, ALKPHOS, BILITOT, PROT, ALBUMIN in the last 168 hours. No results for input(s): LIPASE, AMYLASE in the last 168 hours. No results for input(s): AMMONIA in the last 168 hours.  CBC:  Recent Labs Lab 09/08/15 1138  WBC 5.9  HGB 11.3*  HCT 35.5*  MCV 84.1  PLT 232    Cardiac Enzymes: No results for input(s): CKTOTAL, CKMB, CKMBINDEX, TROPONINI in the last 168 hours.  BNP: Invalid input(s): POCBNP  CBG: No results for input(s): GLUCAP in the last 168 hours.  Microbiology: Results for orders placed or performed during the hospital encounter of 04/09/14  Rapid strep screen     Status: None   Collection Time: 04/09/14  8:52 PM  Result Value Ref Range Status   Streptococcus, Group A Screen (Direct) NEGATIVE NEGATIVE Final    Comment: (NOTE) A Rapid Antigen test may result negative if the antigen level in the sample is below the detection level of this test. The FDA has not cleared this test as a stand-alone test therefore the rapid antigen negative result has reflexed to a Group A Strep culture.  Culture, Group A Strep     Status: None   Collection Time: 04/09/14  8:52 PM  Result Value Ref Range Status   Specimen Description THROAT  Final   Special Requests NONE  Final   Culture   Final    No Beta Hemolytic Streptococci Isolated Performed at [facility]   Report Status 04/11/2014 FINAL  Final    Coagulation Studies: No results for input(s): LABPROT, INR in the last 72 hours.  Urinalysis:  Recent Labs Lab 09/08/15 1335  COLORURINE AMBER*  LABSPEC 1.026  PHURINE 5.0  GLUCOSEU NEGATIVE  HGBUR TRACE*  BILIRUBINUR SMALL*  KETONESUR NEGATIVE  PROTEINUR NEGATIVE  NITRITE NEGATIVE  LEUKOCYTESUR TRACE*    Lipid Panel:  No results found for: CHOL, TRIG, HDL, CHOLHDL, VLDL, LDLCALC  HgbA1C:   Lab Results  Component Value Date   HGBA1C 5.5 03/16/2013    Urine Drug Screen:  No results found for: LABOPIA, COCAINSCRNUR, LABBENZ, AMPHETMU, THCU, LABBARB  Alcohol Level: No results for input(s): ETH in the last 168 hours.   Imaging: Mr Sherrin Daisy Contrast  09/08/2015  CLINICAL DATA:  Speech difficulty for 1 week.  Rule out CVA. EXAM: MRI HEAD WITHOUT CONTRAST TECHNIQUE: Multiplanar, multiecho pulse sequences of the brain and surrounding structures were obtained without intravenous contrast. COMPARISON:  03/04/2014 FINDINGS: Calvarium and upper cervical spine: Hypointense marrow for age in the cervical spine, stable and likely from the patient's obesity. Orbits: No significant findings. Sinuses and Mastoids: Clear. Brain: Focal white matter signal abnormalities in the bilateral cerebral white matter, juxta cortical and periventricular predominantly. The periventricular areas have ovoid radiating morphology. There is also an upper pons signal abnormality. Appearance is consistent  with history of multiple sclerosis. When accounting for differences in field strength and technique, the only certain area of new signal abnormality is in the subcortical parasagittal right frontal lobe, series 5, image 100. No acute infarct, hemorrhage, hydrocephalus, or shift. Areas of diffusion hyperintensity are shine through. No indication of active demyelination. No evidence of opportunistic infection. IMPRESSION: 1. No acute infarct or evidence of active demyelination. 2. Multiple sclerosis with single new right frontal plaque compared to brain MRI 03/04/2014. Electronically Signed   By: Marnee Spring M.D.   On: 09/08/2015 17:02   Mr Cervical Spine Wo Contrast  09/08/2015  CLINICAL DATA:  Speech difficulty for 1 week. History of multiple sclerosis. EXAM: MRI CERVICAL SPINE WITHOUT CONTRAST TECHNIQUE: Multiplanar, multisequence MR imaging of the cervical spine was performed. No intravenous contrast was administered.  COMPARISON:  03/04/2014 FINDINGS: The study is moderately degraded by respiratory motion artifact. Vertebral alignment is within normal limits. Vertebral body heights are preserved. No significant vertebral marrow edema is seen. The cervical spinal cord is normal in caliber. No definite spinal cord signal abnormality is identified, however evaluation is limited by motion artifact and is difficult to completely exclude a subtle lesion at C4-5 on sagittal images, not confirmed axially. There is a medial eyes course of the right common and proximal internal carotid arteries. C2-3: Shallow right posterolateral disc protrusion results in new, mild right neural foraminal narrowing. No spinal stenosis. C3-4: Shallow central disc protrusion results in borderline spinal stenosis, not significantly changed. No significant neural foraminal stenosis. C4-5: Disc bulging results in mild spinal stenosis, not significantly changed. At most minimal right neural foraminal narrowing, also unchanged. C5-6: Broad right paracentral disc protrusion results in mild spinal stenosis and mild right neural foraminal stenosis, not significantly changed. C6-7: Disc bulging and shallow central disc protrusion result in mild spinal stenosis, slightly more prominent than on the prior study. At most minimal bilateral neural foraminal narrowing. C7-T1: Shallow left central disc protrusion and mild left facet arthrosis without significant stenosis. Upper thoracic facet arthrosis appears increased at T1-2 and T2-3 with likely mild bilateral neural foraminal narrowing, most notable on the left at T2-3. IMPRESSION: 1. Moderately motion degraded examination. No definite spinal cord lesion identified. 2. Mild disc and facet degeneration as above, slightly progressed from prior. Electronically Signed   By: Sebastian Ache M.D.   On: 09/08/2015 17:35     Assessment/Plan:  44y/o women with hx of MS, diagnosed by Dr Sandria Manly in 2006, presenting with 1 week  history of stuttering speech and right sided tremor/weakness. Exam shows fluctuating stuttering speech and a right-sided tremor that resolves with distraction. MRI brain shows no evidence of acute infarct or evidence of active demyelination (though was completed without contrast). Patient had received one gram of solumedrol in the ED. Her exam is inconsistent and I am not fully convinced that this represents a true MS flare up. She states she does not want to be admitted. She will need to follow up with her primary neurologist. Instructed her to call [facility] office tomorrow.    Elspeth Cho, DO [facility] [phone]  If 7pm- 7am, please page neurology on call as listed in [facility]ION. 09/08/2015, 7:43 PM

## 2015-09-08 NOTE — ED Notes (Signed)
Patient transported to MRI 

## 2015-09-08 NOTE — ED Notes (Signed)
Pt reports speech changes x1.5 weeks, reports worsened tremors in both hands, but reports bilateral hand numbness that began when the speech changes began. Pt presents with stuttering when trying to speak, is a/o x4.

## 2015-09-08 NOTE — Telephone Encounter (Signed)
Dr. Louis Meckel patient's PCP called with concerns about the patient. She feels that the patient's speech is slurry. She has also noticed cerebellar dysfunction on the right. There is no weakness or facial deviation/droop according to the PCP. She states that she has recommended that the patient go to the emergency room however the patient is not amenable to this. The patient has an MRI scheduled later this month. Her PCP is going to try to move up her MRI.  When I saw her this week she did not have slurry speech but was rather stuttering. She also did not have any weakness, facial droop or tongue deviation. She did have a tremor but it affected the upper body and was very intermittent. I agree with her PCP if she is developing new symptoms or worsening of symptoms then she should go to the emergency room. If she is not amenable to this perhaps her MRI can be moved up. I discussed this with Dr. [name] and she is amenable to this plan as well.

## 2015-09-08 NOTE — ED Notes (Signed)
MRI contacted for status of when patient may be able to come over for her scan, Megan in MRI advised this RN it may be around an hour before they can get patient over for the scan

## 2015-09-08 NOTE — ED Notes (Signed)
Patient reports she is past due for her percocet that she normally takes at home for pain. Dr. Cyndie Chime made aware.

## 2015-09-08 NOTE — ED Provider Notes (Signed)
CSN: [id number]     Arrival date & time 09/08/15  1027 History   First MD Initiated Contact with Patient 09/08/15 1137     Chief Complaint  Patient presents with  . Aphasia     (Consider location/radiation/quality/duration/timing/severity/associated sxs/prior Treatment) HPI Comments: 44 y.o. Female with history of MS, fibromyalgia, DM presents for difficulty with speech.  The patient reports that she usually has change in her speech with her MS flares but that it usually causes her to have a change in her accent.  She says that this time it feels like her mind is going fast but then she can not speak and say what she wants to as quickly as she should.  She says she can say appropriate words but that it takes a long time.  She denies speech issues like this in the past.  No headache or head injury.  She does report mild weakness in the right side of her body that has been new with the onset of the speech change. These symptoms started 1 week ago.  She was seen by her neurologist ([facility]) on Tuesday with these symptoms present but was told they did not think this was her MS but did order outpatient MRI of the brain and C spine for evaluation which is scheduled but not yet completed.  Patient saw her PCP today who sent her to the ED to rule out stroke.   Past Medical History  Diagnosis Date  . MS (multiple sclerosis) (HCC)   . Thyroid disease   . Fibromyalgia   . Sleep apnea   . Acid reflux   . Diabetes mellitus without complication (HCC)     states she was "prediabetic" at one time   Past Surgical History  Procedure Laterality Date  . Hernia repair    . Endometrial biopsy     Family History  Problem Relation Age of Onset  . Hypertension Mother   . High blood pressure Father   . Migraines Mother   . Lung cancer Father    Social History  Substance Use Topics  . Smoking status: Former Smoker    Quit date: 10/01/1988  . Smokeless tobacco: Never Used  . Alcohol Use: No      Comment: Quit alcohol consumption in 1990's   OB History    No data available     Review of Systems  Constitutional: Negative for fever, chills, appetite change and fatigue.  HENT: Negative for congestion, postnasal drip and rhinorrhea.   Respiratory: Negative for cough, chest tightness and shortness of breath.   Cardiovascular: Negative for chest pain and palpitations.  Gastrointestinal: Negative for nausea, vomiting, abdominal pain and diarrhea.  Genitourinary: Negative for dysuria, urgency, hematuria and flank pain.  Musculoskeletal: Negative for myalgias and back pain.  Skin: Negative for rash.  Neurological: Positive for speech difficulty. Negative for dizziness, weakness, light-headedness, numbness and headaches.  Hematological: Does not bruise/bleed easily.      Allergies  Review of patient's allergies indicates no known allergies.  Home Medications   Prior to Admission medications   Medication Sig Start Date End Date Taking? Authorizing Provider  ALPRAZolam Prudy Feeler) 0.25 MG tablet TAKE 1 TABLET BY MOUTH TWICE DAILY 02/11/15  Yes Levert Feinstein, MD  amLODipine (NORVASC) 5 MG tablet Take 5 mg by mouth daily.  07/08/15  Yes Historical Provider, MD  baclofen (LIORESAL) 20 MG tablet TAKE 1 TABLET BY MOUTH 3 TIMES A DAY 05/04/15  Yes Levert Feinstein, MD  cetirizine (ZYRTEC) 10  MG tablet Take 10 mg by mouth daily.   Yes Historical Provider, MD  clotrimazole-betamethasone (LOTRISONE) cream Apply 1 application topically 2 (two) times daily. Under breasts 07/08/15  Yes Historical Provider, MD  docusate sodium (COLACE) 100 MG capsule Take 100 mg by mouth 2 (two) times daily.    Yes Historical Provider, MD  DULoxetine (CYMBALTA) 60 MG capsule Take 60 mg by mouth daily.   Yes Historical Provider, MD  ferrous sulfate 325 (65 FE) MG tablet Take 325 mg by mouth daily with breakfast.   Yes Historical Provider, MD  Fingolimod HCl (GILENYA) 0.5 MG CAPS Take 1 capsule (0.5 mg total) by mouth daily. 07/22/15   Yes Levert Feinstein, MD  FLUoxetine (PROZAC) 20 MG capsule TAKE ONE CAPSULE BY MOUTH EVERY DAY 03/11/15  Yes Levert Feinstein, MD  fluticasone (FLONASE) 50 MCG/ACT nasal spray Place 2 sprays into the nose daily.   Yes Historical Provider, MD  levothyroxine (SYNTHROID, LEVOTHROID) 100 MCG tablet Take 100 mcg by mouth daily before breakfast.   Yes Historical Provider, MD  LYRICA 150 MG capsule Take 1 capsule by mouth daily. 08/11/15  Yes Historical Provider, MD  meloxicam (MOBIC) 15 MG tablet Take 15 mg by mouth daily as needed for pain.   Yes Historical Provider, MD  modafinil (PROVIGIL) 200 MG tablet TAKE 1 TABLET BY MOUTH EVERY DAY 02/16/15  Yes Levert Feinstein, MD  morphine (KADIAN) 50 MG 24 hr capsule Take 50 mg by mouth 2 (two) times daily. 03/10/13  Yes Levert Feinstein, MD  Multiple Vitamins-Minerals (MULTIVITAMIN PO) Take 1 tablet by mouth daily.    Yes Historical Provider, MD  oxyCODONE-acetaminophen (PERCOCET) 7.5-325 MG per tablet Take 1 tablet by mouth 2 (two) times daily as needed for moderate pain.    Yes Historical Provider, MD  pantoprazole (PROTONIX) 40 MG tablet Take 1 tablet (40 mg total) by mouth daily. 08/24/13  Yes Nilda Riggs, NP  polyethylene glycol [facility] / GLYCOLAX) packet Take 17 g by mouth daily.  03/18/13  Yes Leona Singleton, MD  promethazine (PHENERGAN) 25 MG tablet Take 1 tablet (25 mg total) by mouth every 6 (six) hours as needed for nausea or vomiting. 12/05/13  Yes York Spaniel, MD  Vitamin D, Ergocalciferol, (DRISDOL) 50000 UNITS CAPS capsule Take 1 capsule by mouth once a week. 08/02/15  Yes Historical Provider, MD   BP 123/67 mmHg  Pulse 88  Temp(Src) 97.8 F (36.6 C) (Oral)  Resp 22  Ht  (1.626 m)  Wt 319 lb (144.697 kg)  BMI 54.73 kg/m2  SpO2 97% Physical Exam  Constitutional: She is oriented to person, place, and time. She appears well-developed and well-nourished. No distress.  HENT:  Head: Normocephalic and atraumatic.  Right Ear: External ear normal.   Left Ear: External ear normal.  Nose: Nose normal.  Mouth/Throat: Oropharynx is clear and moist. No oropharyngeal exudate.  Eyes: EOM are normal. Pupils are equal, round, and reactive to light.  Neck: Normal range of motion. Neck supple.  Cardiovascular: Normal rate, regular rhythm, normal heart sounds and intact distal pulses.   No murmur heard. Pulmonary/Chest: Effort normal. No respiratory distress. She has no wheezes. She has no rales.  Abdominal: Soft. She exhibits no distension. There is no tenderness.  Musculoskeletal: Normal range of motion. She exhibits no edema or tenderness.  Neurological: She is alert and oriented to person, place, and time. No cranial nerve deficit or sensory deficit. Coordination normal.  On examination very slight decrease in right hand  grip strength compared to left.  No other weakness.  Skin: Skin is warm and dry. No rash noted. She is not diaphoretic.  Vitals reviewed.   ED Course  Procedures (including critical care time) Labs Review Labs Reviewed  BASIC METABOLIC PANEL - Abnormal; Notable for the following:    Glucose, Bld 166 (*)    All other components within normal limits  CBC - Abnormal; Notable for the following:    Hemoglobin 11.3 (*)    HCT 35.5 (*)    All other components within normal limits  URINALYSIS, ROUTINE W REFLEX MICROSCOPIC (NOT AT [facility]) - Abnormal; Notable for the following:    Color, Urine AMBER (*)    Hgb urine dipstick TRACE (*)    Bilirubin Urine SMALL (*)    Leukocytes, UA TRACE (*)    All other components within normal limits  URINE MICROSCOPIC-ADD ON - Abnormal; Notable for the following:    Squamous Epithelial / LPF 0-5 (*)    Bacteria, UA FEW (*)    All other components within normal limits    Imaging Review No results found. I have personally reviewed and evaluated these images and lab results as part of my medical decision-making.   EKG Interpretation None      MDM  Patient seen and evaluated in  stable condition.  Abnormal speech on examination.  Symptoms x1 week.  Concern for CVA vs MS flare.  MRI of the brain and cervical spine were ordered.  UA not consistent with infection.  Patient signed out to night team pending MRI with plan for discharge if negative for CVA. Final diagnoses:  None    1. Speech difficulty    Leta Baptist, MD 09/08/15 [phone]

## 2015-09-08 NOTE — ED Notes (Signed)
Pt has hx of MS and one week ago today she had a sudden onset of difficulty speaking. Pt has hard time getting out words. Pt was seen at PCP today and requested that she come here today to rule out stroke. Pt has no other neuro deficits noted. Pt is alert and ox4.

## 2015-09-09 ENCOUNTER — Telehealth: Payer: Self-pay | Admitting: Neurology

## 2015-09-09 NOTE — Telephone Encounter (Signed)
Spoke to patient. Scheduled appt w/ Dr. [name] on 09/13/15 @ 8am . Advised will fwd to Dr. Zannie Cove RN to make aware. Patient agreed.

## 2015-09-09 NOTE — Telephone Encounter (Signed)
Patient is calling. She was seen in the ER yesterday because she thought might be a stroke but the ER physician said it was her MS.  The patient was told to follow up in our office with Dr. [name]. I offered the patient an appointment for next week but she said she may need to come in sooner because she thinks she is having a MS relapse. The patient's speech has also been affected.  Please call the patient. Thank you.

## 2015-09-13 ENCOUNTER — Ambulatory Visit (INDEPENDENT_AMBULATORY_CARE_PROVIDER_SITE_OTHER): Payer: Medicare PPO | Admitting: Neurology

## 2015-09-13 ENCOUNTER — Encounter: Payer: Self-pay | Admitting: Neurology

## 2015-09-13 VITALS — BP 126/74 | HR 88 | Ht 64.0 in | Wt 324.0 lb

## 2015-09-13 DIAGNOSIS — G35 Multiple sclerosis: Secondary | ICD-10-CM | POA: Diagnosis not present

## 2015-09-13 DIAGNOSIS — R2681 Unsteadiness on feet: Secondary | ICD-10-CM | POA: Diagnosis not present

## 2015-09-13 NOTE — Progress Notes (Signed)
Chief Complaint  Patient presents with  . Multiple Sclerosis    She is here with concerns of having a relapse. Last week, she started having extreme fatigue, right-sided weakness and speech difficulty.  She went to the ED for treatment and was given IV Solu-Medrol which has been some helpful.  She had an updated MRI brain and cervical spine while there.  Says she is taking Gilenya, as prescribed.      PATIENT: [name] DOB: [birth year]  REASON FOR VISIT: follow up-  Multiple sclerosis HISTORY FROM: patient  HISTORY OF PRESENT ILLNESS: HISTORY 12/11/13 [name]): [name] is a 44 year-old right-handed [place] American single female from [name], [place], she was diagnosed with multiple sclerosis 10/2004 with positive MRI of the brain and 6 oligoclonal bands in CSF. She was patient of [name]. She was begun on Avonex. 03/12/2006 she had new cervical medullary lesion and was changed to rebif. She is in the EPOC study with Gilenya and began the medication 05/24/10. She is not having any side effects from the medication. She had a normal examination at the [facility] clinic checking for macular edema on 08/17/10.  .  06/25/11 MRI of the brain and cervical spine showed a few scattered supratentorial nonspecific foci of T2 hyperintensity without enhancement and no change versus 04/19/09. There was disc bulging at C5-6 and C6-7 without cord lesions present and no change versus 06/16/08.   She also has hypothyroidism, diabetes, obstructive sleep apnea She is using CPAP At 13 cm of water with ESS 3. She had pulmonary function tests because of decreased breath sounds which were normal. 07/08/2012 she awoke with nausea and shaking of her right hand and arm, and inability to speak. Her right leg "did not want to go". The following day her speech was hesitant but well-formed. She was treated by iv followed by po steroid.   UPDATE June 5th 2014:  Since her last visit in May 20  third, she had multiple phone call to our office, with constellation of complains, including increased bilateral lower extremity tremor, gait difficulty, continued language difficulty, she is taking baclofen 20 mg 3 times a day, Xanax 0. 2 5 mg twice a day per instruction of on-call physician.   UPDATE July 7th 2014 She presented with progressive weakness of left lower extremity and difficulty with walking as well as progression of tremor affecting right extremities more than left extremities and also affecting her speech. She was admitted for a course of Solu-Medrol intravenously, 500 mg every 12 hours x6 from June 16th to June 18th, which has been very helpful. She wants home physical therapy MRI of brain showed few periventricular and subcortical T2 hyperintensities. Some of these are hypointense on T1 views. No abnormal lesions are seen on post contrast views   She was seen by [name] in March 3rd, reported that In February 28 she awakened with a numbness in her right leg and right arm and weakness having trouble with her ambulation. She is having problems with her speech. She received five days of iv solumedrol, which did help her symptoms moderately, but she complains of upset stomach, loss of appetite, elevated glucose, mother complains of labile emotion  UPDATE Sep 13 2015: She presented with increased right side weakness in early Dec 2016, she was admitted to hospital in September 08 2015, there was no evidence of urinary tract infection, she was given a dose of Solu-Medrol, which did help her symptoms some  I have reviewed  MRI brain: No acute infarct or evidence of active demyelination. Multiple sclerosis with single new right frontal plaque compared to brain MRI 03/04/2014.  MRI cervical Moderately motion degraded examination. No definite spinal cord lesion identified.  Mild disc and facet degeneration    I reviewed laboratory evaluation September 08 2015 showed normal CBC, Hg  11.3  REVIEW OF SYSTEMS: Out of a complete 14 system review of symptoms, the patient complains only of the following symptoms, ringing ears, light sensitivity, memory loss  ALLERGIES: No Known Allergies  HOME MEDICATIONS: Outpatient Prescriptions Prior to Visit  Medication Sig Dispense Refill  . ALPRAZolam (XANAX) 0.25 MG tablet TAKE 1 TABLET BY MOUTH TWICE DAILY 60 tablet 5  . amLODipine (NORVASC) 5 MG tablet Take 5 mg by mouth daily.   0  . baclofen (LIORESAL) 20 MG tablet TAKE 1 TABLET BY MOUTH 3 TIMES A DAY 90 tablet 11  . cetirizine (ZYRTEC) 10 MG tablet Take 10 mg by mouth daily.    . clotrimazole-betamethasone (LOTRISONE) cream Apply 1 application topically 2 (two) times daily. Under breasts  0  . docusate sodium (COLACE) 100 MG capsule Take 100 mg by mouth 2 (two) times daily.     . DULoxetine (CYMBALTA) 60 MG capsule Take 60 mg by mouth daily.    . ferrous sulfate 325 (65 FE) MG tablet Take 325 mg by mouth daily with breakfast.    . Fingolimod HCl (GILENYA) 0.5 MG CAPS Take 1 capsule (0.5 mg total) by mouth daily. 90 capsule 1  . FLUoxetine (PROZAC) 20 MG capsule TAKE ONE CAPSULE BY MOUTH EVERY DAY 90 capsule 4  . fluticasone (FLONASE) 50 MCG/ACT nasal spray Place 2 sprays into the nose daily.    Marland Kitchen levothyroxine (SYNTHROID, LEVOTHROID) 100 MCG tablet Take 100 mcg by mouth daily before breakfast.    . LYRICA 150 MG capsule Take 1 capsule by mouth daily.  2  . meloxicam (MOBIC) 15 MG tablet Take 15 mg by mouth daily as needed for pain.    . modafinil (PROVIGIL) 200 MG tablet TAKE 1 TABLET BY MOUTH EVERY DAY 30 tablet 5  . morphine (KADIAN) 50 MG 24 hr capsule Take 50 mg by mouth 2 (two) times daily.    . Multiple Vitamins-Minerals (MULTIVITAMIN PO) Take 1 tablet by mouth daily.     Marland Kitchen oxyCODONE-acetaminophen (PERCOCET) 7.5-325 MG per tablet Take 1 tablet by mouth 2 (two) times daily as needed for moderate pain.     . pantoprazole (PROTONIX) 40 MG tablet Take 1 tablet (40 mg total)  by mouth daily. 30 tablet 1  . polyethylene glycol (MIRALAX / GLYCOLAX) packet Take 17 g by mouth daily.     . promethazine (PHENERGAN) 25 MG tablet Take 1 tablet (25 mg total) by mouth every 6 (six) hours as needed for nausea or vomiting. 30 tablet 2  . Vitamin D, Ergocalciferol, (DRISDOL) 50000 UNITS CAPS capsule Take 1 capsule by mouth once a week.  1   No facility-administered medications prior to visit.    PAST MEDICAL HISTORY: Past Medical History  Diagnosis Date  . MS (multiple sclerosis) (HCC)   . Thyroid disease   . Fibromyalgia   . Sleep apnea   . Acid reflux   . Diabetes mellitus without complication (HCC)     states she was "prediabetic" at one time    PAST SURGICAL HISTORY: Past Surgical History  Procedure Laterality Date  . Hernia repair    . Endometrial biopsy  FAMILY HISTORY: Family History  Problem Relation Age of Onset  . Hypertension Mother   . High blood pressure Father   . Migraines Mother   . Lung cancer Father     SOCIAL HISTORY: Social History   Social History  . Marital Status: Divorced    Spouse Name: N/A  . Number of Children: 1  . Years of Education: college   Occupational History  .      disabled   Social History Main Topics  . Smoking status: Former Smoker    Quit date: 10/01/1988  . Smokeless tobacco: Never Used  . Alcohol Use: No     Comment: Quit alcohol consumption in 1990's  . Drug Use: No  . Sexual Activity: Not on file   Other Topics Concern  . Not on file   Social History Narrative   She lives with her mother, daughter, she is on disability.    Right handed.   Education college.   Caffeine soda 2 daily      PHYSICAL EXAM  Filed Vitals:   09/13/15 0807  BP: 126/74  Pulse: 88  Height:  (1.626 m)  Weight: 324 lb (146.965 kg)   Body mass index is 55.59 kg/(m^2).   PHYSICAL EXAMNIATION:  Gen: NAD, conversant, well nourised, obese, well groomed                     Cardiovascular: Regular rate  rhythm, no peripheral edema, warm, nontender. Eyes: Conjunctivae clear without exudates or hemorrhage Neck: Supple, no carotid bruise. Pulmonary: Clear to auscultation bilaterally   NEUROLOGICAL EXAM:  MENTAL STATUS: Speech:    Speech is normal; fluent and spontaneous with normal comprehension.  Cognition:     Orientation to time, place and person     Normal recent and remote memory     Normal Attention span and concentration     Normal Language, naming, repeating,spontaneous speech     Fund of knowledge   CRANIAL NERVES: CN II: Visual fields are full to confrontation. Fundoscopic exam is normal with sharp discs and no vascular changes. Pupils are round equal and briskly reactive to light. CN III, IV, VI: extraocular movement are normal. No ptosis. CN V: Facial sensation is intact to pinprick in all 3 divisions bilaterally. Corneal responses are intact.  CN VII: Face is symmetric with normal eye closure and smile. CN VIII: Hearing is normal to rubbing fingers CN IX, X: Palate elevates symmetrically. Phonation is normal. CN XI: Head turning and shoulder shrug are intact CN XII: Tongue is midline with normal movements and no atrophy.  MOTOR: There is no pronator drift of out-stretched arms. Muscle bulk and tone are normal. Muscle strength is normal.  REFLEXES: Reflexes are 2+ and symmetric at the biceps, triceps, knees, and ankles. Plantar responses are flexor.  SENSORY: Intact to light touch, pinprick, position sense, and vibration sense are intact in fingers and toes.  COORDINATION: Rapid alternating movements and fine finger movements are intact. There is no dysmetria on finger-to-nose and heel-knee-shin.    GAIT/STANCE: She can get up from seated position arm cross, wide based cautious gait, use cane  DIAGNOSTIC DATA (LABS, IMAGING, TESTING) - I reviewed patient records, labs, notes, testing and imaging myself where available.   ASSESSMENT AND PLAN   Relapsing  Remitting Multiple Sclerosis  Keep Gilenya daily  Gait difficulty  Multifactorial, this is due to her obesity, multiple joints pain, less likely due to MS exacerbation I will not treat her  with steroid,   Home physical therapy  Keep follow-up with Aundra Millet in January 19 2015  Levert Feinstein, M.D. Ph.D.  [facility] [address] [place], [place] [address] Phone: [phone] Fax:      [phone]

## 2015-09-15 ENCOUNTER — Other Ambulatory Visit: Payer: Self-pay | Admitting: Neurology

## 2015-09-19 ENCOUNTER — Telehealth: Payer: Self-pay | Admitting: Neurology

## 2015-09-19 NOTE — Telephone Encounter (Signed)
Amy/[facility] [phone] called to get verbal approval today to continue PT 2 x week for 5 weeks, 1 x week for 3 weeks.

## 2015-09-19 NOTE — Telephone Encounter (Signed)
Received verbal order from Dr. Vickey Huger (WID for Dr. [name] today) to continue PT 2x week for 5 weeks, and then 1 x week for 3 weeks.  Spoke to Amy at [facility] and relayed this information. Amy verbalized understanding.

## 2015-09-21 ENCOUNTER — Other Ambulatory Visit: Payer: Medicare PPO

## 2015-09-22 NOTE — Telephone Encounter (Signed)
I called the patient to make sure she is not having any new MS symptoms. She states her symptoms are the same as when she was last in the office 09/13/15. I advised that Dr. [name] felt like PT was the best treatment for her and I encouraged her to call Ruben back to schedule PT appointment. I gave her Ruben's number and she stated she would call him. I also called Caron Presume and left a voicemail giving him this update.

## 2015-09-22 NOTE — Telephone Encounter (Signed)
Ruben/[facility] [phone] called to advise appointment for PT yesterday, patient didn't feel she could do yesterday due to MS exacerbations and attempted today, called 3 times and patient has never called back once. Will attempt again tomorrow.

## 2015-09-22 NOTE — Telephone Encounter (Signed)
[name] called back to let me know that the patient called him and they are scheduled for PT now.

## 2016-01-09 ENCOUNTER — Other Ambulatory Visit: Payer: Self-pay | Admitting: *Deleted

## 2016-01-09 MED ORDER — FINGOLIMOD HCL 0.5 MG PO CAPS: 0.5000 mg | ORAL_CAPSULE | Freq: Every day | ORAL | Status: DC

## 2016-01-19 ENCOUNTER — Encounter: Payer: Self-pay | Admitting: Adult Health

## 2016-01-19 ENCOUNTER — Ambulatory Visit (INDEPENDENT_AMBULATORY_CARE_PROVIDER_SITE_OTHER): Payer: Medicare PPO | Admitting: Adult Health

## 2016-01-19 VITALS — BP 133/82 | HR 93 | Ht 64.0 in | Wt 323.4 lb

## 2016-01-19 DIAGNOSIS — G35 Multiple sclerosis: Secondary | ICD-10-CM | POA: Diagnosis not present

## 2016-01-19 NOTE — Progress Notes (Signed)
I have reviewed and agreed above plan. 

## 2016-01-19 NOTE — Progress Notes (Signed)
PATIENT: [name] DOB: Jul 30, 1971  REASON FOR VISIT: follow up HISTORY FROM: patient  HISTORY OF PRESENT ILLNESS: [name] is a 45 year old female with a history of multiple sclerosis. She returns today for follow-up. She has continued on Gilenya. She denies any changes with her gait or balance. She states that she has to use a cane when ambulating. She reports that occasionally she has a hard time standing for long periods of time. She did participate in physical therapy after her last office visit. She denies any new numbness or weakness. She reports that she does have low back pain that radiates down the left leg. She also has some pain in the left thigh area and was told by Dr. love that this was meralgia paresthetica. She is currently on Lyrica and Cymbalta. She denies any changes with the bowels. She does report that she occasionally has some urgency with urination. She has put herself on a toileting schedule and has found this beneficial. Denies any changes with the vision. She reports that if she has an increase in her anxiety or stress her tremor will become worse. She reports that she will have blood work with her primary care next month. She returns today for an evaluation.   HISTORY (YAN):   12/11/13 [name]): Jovie is a 44 year-old right-handed [place] American single female from [place], [place], she was diagnosed with multiple sclerosis 10/2004 with positive MRI of the brain and 6 oligoclonal bands in CSF. She was patient of Dr. Sandria Manly. She was begun on Avonex. 03/12/2006 she had new cervical medullary lesion and was changed to rebif. She is in the EPOC study with Gilenya and began the medication 05/24/10. She is not having any side effects from the medication. She had a normal examination at the [facility] clinic checking for macular edema on 08/17/10.  .  06/25/11 MRI of the brain and cervical spine showed a few scattered supratentorial  nonspecific foci of T2 hyperintensity without enhancement and no change versus 04/19/09. There was disc bulging at C5-6 and C6-7 without cord lesions present and no change versus 06/16/08.   She also has hypothyroidism, diabetes, obstructive sleep apnea She is using CPAP At 13 cm of water with ESS 3. She had pulmonary function tests because of decreased breath sounds which were normal. 07/08/2012 she awoke with nausea and shaking of her right hand and arm, and inability to speak. Her right leg "did not want to go". The following day her speech was hesitant but well-formed. She was treated by iv followed by po steroid.   UPDATE June 5th 2014:  Since her last visit in May 20 third, she had multiple phone call to our office, with constellation of complains, including increased bilateral lower extremity tremor, gait difficulty, continued language difficulty, she is taking baclofen 20 mg 3 times a day, Xanax 0. 2 5 mg twice a day per instruction of on-call physician.   UPDATE July 7th 2014 She presented with progressive weakness of left lower extremity and difficulty with walking as well as progression of tremor affecting right extremities more than left extremities and also affecting her speech. She was admitted for a course of Solu-Medrol intravenously, 500 mg every 12 hours x6 from June 16th to June 18th, which has been very helpful. She wants home physical therapy MRI of brain showed few periventricular and subcortical T2 hyperintensities. Some of these are hypointense on T1 views. No abnormal lesions are seen on post contrast  views   She was seen by [name] in March 3rd, reported that In February 28 she awakened with a numbness in her right leg and right arm and weakness having trouble with her ambulation. She is having problems with her speech. She received five days of iv solumedrol, which did help her symptoms moderately, but she complains of upset stomach, loss of appetite, elevated glucose,  mother complains of labile emotion  UPDATE Sep 13 2015: She presented with increased right side weakness in early Dec 2016, she was admitted to hospital in September 08 2015, there was no evidence of urinary tract infection, she was given a dose of Solu-Medrol, which did help her symptoms some  I have reviewed MRI brain: No acute infarct or evidence of active demyelination. Multiple sclerosis with single new right frontal plaque compared to brain MRI 03/04/2014.  MRI cervical Moderately motion degraded examination. No definite spinal cord lesion identified. Mild disc and facet degeneration   I reviewed laboratory evaluation September 08 2015 showed normal CBC, Hg 11.3  REVIEW OF SYSTEMS: Out of a complete 14 system review of symptoms, the patient complains only of the following symptoms, and all other reviewed systems are negative.  Fatigue, cold intolerance, heat intolerance, constipation, insomnia, apnea, back pain, aching muscles, frequency of urination, incontinence of bladder, numbness, tremors, depression,   ALLERGIES: No Known Allergies  HOME MEDICATIONS: Outpatient Prescriptions Prior to Visit  Medication Sig Dispense Refill  . ALPRAZolam (XANAX) 0.25 MG tablet TAKE 1 TABLET BY MOUTH TWICE DAILY 60 tablet 5  . amLODipine (NORVASC) 5 MG tablet Take 5 mg by mouth daily.   0  . baclofen (LIORESAL) 20 MG tablet TAKE 1 TABLET BY MOUTH 3 TIMES A DAY 90 tablet 11  . cetirizine (ZYRTEC) 10 MG tablet Take 10 mg by mouth daily.    . Cholecalciferol (VITAMIN D PO) Take 2,000 Units by mouth daily.     Marland Kitchen docusate sodium (COLACE) 100 MG capsule Take 100 mg by mouth 2 (two) times daily.     . DULoxetine (CYMBALTA) 60 MG capsule Take 60 mg by mouth daily.    . ferrous sulfate 325 (65 FE) MG tablet Take 325 mg by mouth daily with breakfast.    . Fingolimod HCl (GILENYA) 0.5 MG CAPS Take 1 capsule (0.5 mg total) by mouth daily. 90 capsule 1  . FLUoxetine (PROZAC) 20 MG capsule TAKE ONE CAPSULE BY  MOUTH EVERY DAY 90 capsule 4  . fluticasone (FLONASE) 50 MCG/ACT nasal spray Place 2 sprays into the nose daily.    Marland Kitchen levothyroxine (SYNTHROID, LEVOTHROID) 100 MCG tablet Take 100 mcg by mouth daily before breakfast.    . LYRICA 150 MG capsule Take 1 capsule by mouth daily.  2  . meloxicam (MOBIC) 15 MG tablet Take 15 mg by mouth daily as needed for pain.    . modafinil (PROVIGIL) 200 MG tablet TAKE 1 TABLET BY MOUTH EVERY DAY 30 tablet 5  . morphine (KADIAN) 50 MG 24 hr capsule Take 50 mg by mouth 2 (two) times daily.    . Multiple Vitamins-Minerals (MULTIVITAMIN PO) Take 1 tablet by mouth daily.     Marland Kitchen oxyCODONE-acetaminophen (PERCOCET) 7.5-325 MG per tablet Take 1 tablet by mouth 2 (two) times daily as needed for moderate pain.     . pantoprazole (PROTONIX) 40 MG tablet Take 1 tablet (40 mg total) by mouth daily. 30 tablet 1  . polyethylene glycol (MIRALAX / GLYCOLAX) packet Take 17 g by mouth daily.     Marland Kitchen  clotrimazole-betamethasone (LOTRISONE) cream Apply 1 application topically 2 (two) times daily. Under breasts  0   No facility-administered medications prior to visit.    PAST MEDICAL HISTORY: Past Medical History  Diagnosis Date  . MS (multiple sclerosis) (HCC)   . Thyroid disease   . Fibromyalgia   . Sleep apnea   . Acid reflux   . Diabetes mellitus without complication (HCC)     states she was "prediabetic" at one time    PAST SURGICAL HISTORY: Past Surgical History  Procedure Laterality Date  . Hernia repair    . Endometrial biopsy      FAMILY HISTORY: Family History  Problem Relation Age of Onset  . Hypertension Mother   . High blood pressure Father   . Migraines Mother   . Lung cancer Father     SOCIAL HISTORY: Social History   Social History  . Marital Status: Divorced    Spouse Name: N/A  . Number of Children: 1  . Years of Education: college   Occupational History  .      disabled   Social History Main Topics  . Smoking status: Former Smoker     Quit date: 10/01/1988  . Smokeless tobacco: Never Used  . Alcohol Use: No     Comment: Quit alcohol consumption in 1990's  . Drug Use: No  . Sexual Activity: Not on file   Other Topics Concern  . Not on file   Social History Narrative   She lives with her mother, daughter, she is on disability.    Right handed.   Education college.   Caffeine soda 2 daily      PHYSICAL EXAM  Filed Vitals:   01/19/16 1014  BP: 133/82  Pulse: 93  Height:  (1.626 m)  Weight: 323 lb 6.4 oz (146.693 kg)   Body mass index is 55.48 kg/(m^2).  Generalized: Well developed, in no acute distress   Neurological examination  Mentation: Alert oriented to time, place, history taking. Follows all commands. Speech is slurred at times. At other times it seems as if she has a [place] accent.  Cranial nerve II-XII: Pupils were equal round reactive to light. Extraocular movements were full, visual field were full on confrontational test. Facial sensation and strength were normal. Uvula tongue midline. Head turning and shoulder shrug  were normal and symmetric. Motor: The motor testing reveals 5 over 5 strength of all 4 extremities. Good symmetric motor tone is noted throughout.  Sensory: Sensory testing is intact to soft touch on all 4 extremities. No evidence of extinction is noted.  Coordination: Cerebellar testing reveals good finger-nose-finger and heel-to-shin bilaterally.  Gait and station: Patient uses a cane when ambulating. Tandem gait is unsteady. Romberg is negative..  Reflexes: Deep tendon reflexes are symmetric but depressed in the lower extremity.  DIAGNOSTIC DATA (LABS, IMAGING, TESTING) - I reviewed patient records, labs, notes, testing and imaging myself where available.  Lab Results  Component Value Date   WBC 5.9 09/08/2015   HGB 11.3* 09/08/2015   HCT 35.5* 09/08/2015   MCV 84.1 09/08/2015   PLT 232 09/08/2015      Component Value Date/Time   NA 140 09/08/2015 1138   NA  142 12/04/2013 0923   K 3.8 09/08/2015 1138   CL 103 09/08/2015 1138   CO2 29 09/08/2015 1138   GLUCOSE 166* 09/08/2015 1138   GLUCOSE 93 12/04/2013 0923   BUN 14 09/08/2015 1138   BUN 12 12/04/2013 0923   CREATININE  0.92 09/08/2015 1138   CALCIUM 9.0 09/08/2015 1138   PROT 6.4 12/04/2013 0923   PROT 6.6 08/22/2013 2139   ALBUMIN 4.1 12/04/2013 0923   ALBUMIN 3.5 08/22/2013 2139   AST 17 12/04/2013 0923   ALT 10 12/04/2013 0923   ALKPHOS 87 12/04/2013 0923   BILITOT 0.3 12/04/2013 0923   GFRNONAA >60 09/08/2015 1138   GFRAA >60 09/08/2015 1138      ASSESSMENT AND PLAN 45 y.o. year old female  has a past medical history of MS (multiple sclerosis) (HCC); Thyroid disease; Fibromyalgia; Sleep apnea; Acid reflux; and Diabetes mellitus without complication (HCC). here with:  1. Multiple sclerosis  Overall the patient is doing well. She will continue on Gilenya. Her primary care will check blood work next month. I have asked her to have this faxed to our office. The patient will remain on a toileting schedule to treat her urgency. I have advised that if her symptoms worsen or she develops any new symptoms she should let us know. She will follow-up in 6 months or sooner if needed.   Butch Penny, MSN, NP-C 01/19/2016, 10:55 AM [facility] [address] [place], [place] [address] [phone]

## 2016-01-19 NOTE — Patient Instructions (Signed)
Continue Gilenya Blood work with PCP- CBC. CMP If your symptoms worsen or you develop new symptoms please let us know.

## 2016-02-23 ENCOUNTER — Telehealth: Payer: Self-pay | Admitting: Neurology

## 2016-02-23 NOTE — Telephone Encounter (Signed)
I reviewed laboratory evaluation dated Feb 08 2016, ferritin 30.9 within normal limits, folic acid 22, iron level 57 mildly low, TIBC 484 was high, UIBC 427 Normal B12 449, cholesterol 160, LDL 92, TSH 4.827 mildly elevated, free T4 1.34 within normal limit T3 1 120 9.75 within normal limit normal CMP, with creatinine 0.9, normal CBC, with exception of decreased hemoglobin 10 point 7, vitamin D level was mildly decreased 25.4, A1c was 5.6, PTH 55.7 within normal limit.   Please call patient vitamin D level was mildly decreased 25.4, she should take over-the-counter vitamin D 3 supplement 1000 units daily

## 2016-02-23 NOTE — Telephone Encounter (Signed)
I spoke to patient and she is aware of results and recommendations.  

## 2016-02-29 DIAGNOSIS — Z0289 Encounter for other administrative examinations: Secondary | ICD-10-CM

## 2016-03-22 ENCOUNTER — Other Ambulatory Visit: Payer: Self-pay | Admitting: Neurology

## 2016-03-24 ENCOUNTER — Other Ambulatory Visit: Payer: Self-pay | Admitting: Neurology

## 2016-03-26 ENCOUNTER — Telehealth: Payer: Self-pay | Admitting: Neurology

## 2016-03-26 ENCOUNTER — Other Ambulatory Visit: Payer: Self-pay | Admitting: *Deleted

## 2016-03-26 ENCOUNTER — Telehealth: Payer: Self-pay | Admitting: *Deleted

## 2016-03-26 MED ORDER — MODAFINIL 200 MG PO TABS: 200.0000 mg | ORAL_TABLET | Freq: Every day | ORAL | Status: DC

## 2016-03-26 NOTE — Telephone Encounter (Signed)
error 

## 2016-03-26 NOTE — Telephone Encounter (Signed)
Pt form faxed to [facility] on 03/26/2016.

## 2016-05-02 ENCOUNTER — Telehealth: Payer: Self-pay | Admitting: *Deleted

## 2016-05-02 NOTE — Telephone Encounter (Signed)
Modafinil PA approved by [facility] [phone]) through 05/01/2018.  Pt [id number].

## 2016-05-29 ENCOUNTER — Other Ambulatory Visit: Payer: Self-pay | Admitting: Neurology

## 2016-06-25 ENCOUNTER — Other Ambulatory Visit: Payer: Self-pay | Admitting: Neurology

## 2016-07-23 ENCOUNTER — Ambulatory Visit (INDEPENDENT_AMBULATORY_CARE_PROVIDER_SITE_OTHER): Payer: Medicare PPO | Admitting: Adult Health

## 2016-07-23 ENCOUNTER — Encounter: Payer: Self-pay | Admitting: Adult Health

## 2016-07-23 VITALS — BP 106/66 | HR 68 | Resp 18 | Ht 64.5 in | Wt 286.2 lb

## 2016-07-23 DIAGNOSIS — G35 Multiple sclerosis: Secondary | ICD-10-CM

## 2016-07-23 DIAGNOSIS — F411 Generalized anxiety disorder: Secondary | ICD-10-CM

## 2016-07-23 NOTE — Progress Notes (Signed)
PATIENT: [name] DOB: 1970-10-19  REASON FOR VISIT: follow up- multiple sclerosis HISTORY FROM: patient  HISTORY OF PRESENT ILLNESS:  Today 07/23/16: Ms. Brooke Asters- Astucci is a 45 year old female with a history of multiple sclerosis. She returns today for follow-up. She is currently on Gilenya and tolerating it well. She denies any new numbness or weakness. She does report "pins and needles sensation" in the fingertips bilaterally. She states this started about 2 months ago. Denies any changes with her gait or balance. She continues to use a cane. She does report urinary urgency but it is controlled. 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 The patient reports that sometimes her tremor gets severe and if she takes half a baclofen it does improve. She states that his only been 4 occasions since her last visit in April that she's had to take baclofen. She does think that her tremor gets worse with anxiety and stress.  Her last MRI was in December 2016 and it did show one new lesion. Her recent blood work was relatively unremarkable with the exception of a low vitamin D level. She states that she uses Xanax to help calm her nerves. She states that his energy is to help with her sleep although now she feels that she has gotten used to it. . She returns today for an evaluation.  HISTORY (YAN):   12/11/13 [name]): [name] is a 45 year-old right-handed [place] American single female from [place], [place], she was diagnosed with multiple sclerosis 10/2004 with positive MRI of the brain and 6 oligoclonal bands in CSF. She was patient of Dr. Sandria Manly. She was begun on Avonex. 03/12/2006 she had new cervical medullary lesion and was changed to rebif. She is in the EPOC study with Gilenya and began the medication 05/24/10. She is not having any side effects from the medication. She had a normal examination at the [facility] clinic checking for  macular edema on 08/17/10.   06/25/11 MRI of the brain and cervical spine showed a few scattered supratentorial nonspecific foci of T2 hyperintensity without enhancement and no change versus 04/19/09. There was disc bulging at C5-6 and C6-7 without cord lesions present and no change versus 06/16/08.   She also has hypothyroidism, diabetes, obstructive sleep apnea She is using CPAP At 13 cm of water with ESS 3. She had pulmonary function tests because of decreased breath sounds which were normal. 07/08/2012 she awoke with nausea and shaking of her right hand and arm, and inability to speak. Her right leg "did not want to go". The following day her speech was hesitant but well-formed. She was treated by iv followed by po steroid.   UPDATE June 5th 2014:  Since her last visit in May 20 third, she had multiple phone call to our office, with constellation of complains, including increased bilateral lower extremity tremor, gait difficulty, continued language difficulty, she is taking baclofen 20 mg 3 times a day, Xanax 0. 2 5 mg twice a day per instruction of on-call physician.   UPDATE July 7th 2014 She presented with progressive weakness of left lower extremity and difficulty with walking as well as progression of tremor affecting right extremities more than left extremities and also affecting her speech. She was admitted for a course of Solu-Medrol intravenously, 500 mg every 12 hours x6 from June 16th to June 18th, which has been very helpful. She wants home physical therapy MRI of brain showed few periventricular and  subcortical T2 hyperintensities. Some of these are hypointense on T1 views. No abnormal lesions are seen on post contrast views   She was seen by [name] in March 3rd, reported that In February 28 she awakened with a numbness in her right leg and right arm and weakness having trouble with her ambulation. She is having problems with her speech. She received five days of iv solumedrol,  which did help her symptoms moderately, but she complains of upset stomach, loss of appetite, elevated glucose, mother complains of labile emotion  UPDATE Sep 13 2015: She presented with increased right side weakness in early Dec 2016, she was admitted to hospital in September 08 2015, there was no evidence of urinary tract infection, she was given a dose of Solu-Medrol, which did help her symptoms some  I have reviewed MRI brain: No acute infarct or evidence of active demyelination. Multiple sclerosis with single new right frontal plaque compared to brain MRI 03/04/2014.  MRI cervical Moderately motion degraded examination. No definite spinal cord lesion identified. Mild disc and facet degeneration   I reviewed laboratory evaluation September 08 2015 showed normal CBC, Hg 11.3  update 01/19/16: Ms. Brooke Asters- [name] is a 45 year old female with a history of multiple sclerosis. She returns today for follow-up. She has continued on Gilenya. She denies any changes with her gait or balance. She states that she has to use a cane when ambulating. She reports that occasionally she has a hard time standing for long periods of time. She did participate in physical therapy after her last office visit. She denies any new numbness or weakness. She reports that she does have low back pain that radiates down the left leg. She also has some pain in the left thigh area and was told by Dr. love that this was meralgia paresthetica. She is currently on Lyrica and Cymbalta. She denies any changes with the bowels. She does report that she occasionally has some urgency with urination. She has put herself on a toileting schedule and has found this beneficial. Denies any changes with the vision. She reports that if she has an increase in her anxiety or stress her tremor will become worse. She reports that she will have blood work with her primary care next month. She returns today for an evaluation.  REVIEW OF SYSTEMS: Out of a  complete 14 system review of symptoms, the patient complains only of the following symptoms, and all other reviewed systems are negative.  Eye itching, excessive sweating, apnea, aching muscles, walking difficulty, joint pain, headache     ALLERGIES: No Known Allergies  HOME MEDICATIONS: Outpatient Medications Prior to Visit  Medication Sig Dispense Refill  . ALPRAZolam (XANAX) 0.25 MG tablet TAKE 1 TABLET BY MOUTH TWICE DAILY 60 tablet 5  . amLODipine (NORVASC) 5 MG tablet Take 5 mg by mouth daily.   0  . baclofen (LIORESAL) 20 MG tablet TAKE 1 TABLET BY MOUTH THREE TIMES DAILY 90 tablet 11  . cetirizine (ZYRTEC) 10 MG tablet Take 10 mg by mouth daily.    . Cholecalciferol (VITAMIN D PO) Take 2,000 Units by mouth daily.     Marland Kitchen docusate sodium (COLACE) 100 MG capsule Take 100 mg by mouth 2 (two) times daily.     . DULoxetine (CYMBALTA) 60 MG capsule Take 60 mg by mouth daily.    . ferrous sulfate 325 (65 FE) MG tablet Take 325 mg by mouth daily with breakfast.    . FLUoxetine (PROZAC) 20 MG capsule TAKE 1  CAPSULE BY MOUTH EVERY DAY 90 capsule 3  . fluticasone (FLONASE) 50 MCG/ACT nasal spray Place 2 sprays into the nose daily.    Marland Kitchen GILENYA 0.5 MG CAPS TAKE 1 CAPSULE BY MOUTH DAILY 30 capsule 11  . levothyroxine (SYNTHROID, LEVOTHROID) 100 MCG tablet Take 100 mcg by mouth daily before breakfast.    . LYRICA 150 MG capsule Take 1 capsule by mouth daily.  2  . meloxicam (MOBIC) 15 MG tablet Take 15 mg by mouth daily as needed for pain.    . modafinil (PROVIGIL) 200 MG tablet Take 1 tablet (200 mg total) by mouth daily. 30 tablet 5  . morphine (KADIAN) 50 MG 24 hr capsule Take 50 mg by mouth 2 (two) times daily.    . Multiple Vitamins-Minerals (MULTIVITAMIN PO) Take 1 tablet by mouth daily.     Marland Kitchen oxyCODONE-acetaminophen (PERCOCET) 7.5-325 MG per tablet Take 1 tablet by mouth 2 (two) times daily as needed for moderate pain.     . pantoprazole (PROTONIX) 40 MG tablet Take 1 tablet (40 mg  total) by mouth daily. 30 tablet 1  . polyethylene glycol (MIRALAX / GLYCOLAX) packet Take 17 g by mouth daily.      No facility-administered medications prior to visit.     PAST MEDICAL HISTORY: Past Medical History:  Diagnosis Date  . Acid reflux   . Diabetes mellitus without complication (HCC)    states she was "prediabetic" at one time  . Fibromyalgia   . MS (multiple sclerosis) (HCC)   . Sleep apnea   . Thyroid disease     PAST SURGICAL HISTORY: Past Surgical History:  Procedure Laterality Date  . ENDOMETRIAL BIOPSY    . HERNIA REPAIR      FAMILY HISTORY: Family History  Problem Relation Age of Onset  . Hypertension Mother   . High blood pressure Father   . Migraines Mother   . Lung cancer Father     SOCIAL HISTORY: Social History   Social History  . Marital status: Divorced    Spouse name: N/A  . Number of children: 1  . Years of education: college   Occupational History  .  Unemployed    disabled   Social History Main Topics  . Smoking status: Former Smoker    Quit date: 10/01/1988  . Smokeless tobacco: Never Used  . Alcohol use No     Comment: Quit alcohol consumption in 1990's  . Drug use: No  . Sexual activity: Not on file   Other Topics Concern  . Not on file   Social History Narrative   She lives with her mother, daughter, she is on disability.    Right handed.   Education college.   Caffeine soda 2 daily      PHYSICAL EXAM  Vitals:   07/23/16 1041  BP: 106/66  Pulse: 68  Resp: 18  Weight: 286 lb 3.2 oz (129.8 kg)  Height: 5' 4.5" (1.638 m)   Body mass index is 48.37 kg/m.  Generalized: Well developed, in no acute distress   Neurological examination  Mentation: Alert oriented to time, place, history taking. Follows all commands speech and language fluent. [place]  accident Cranial nerve II-XII: Pupils were equal round reactive to light. Extraocular movements were full, visual field were full on confrontational test.  Facial sensation and strength were normal. Uvula tongue midline. Head turning and shoulder shrug  were normal and symmetric. Motor: The motor testing reveals 5 over 5 strength of all 4 extremities.  Good symmetric motor tone is noted throughout. No tremor noted. Sensory: Sensory testing is intact to soft touch on all 4 extremities. No evidence of extinction is noted.  Coordination: Cerebellar testing reveals good finger-nose-finger and heel-to-shin bilaterally.  Gait and station: Gait is normal. Slightly stooped posture. Tandem gait is normal. Romberg is negative. No drift is seen.  Reflexes: Deep tendon reflexes are symmetric and normal bilaterally.   DIAGNOSTIC DATA (LABS, IMAGING, TESTING) - I reviewed patient records, labs, notes, testing and imaging myself where available.  Lab Results  Component Value Date   WBC 5.9 09/08/2015   HGB 11.3 (L) 09/08/2015   HCT 35.5 (L) 09/08/2015   MCV 84.1 09/08/2015   PLT 232 09/08/2015      Component Value Date/Time   NA 140 09/08/2015 1138   NA 142 12/04/2013 0923   K 3.8 09/08/2015 1138   CL 103 09/08/2015 1138   CO2 29 09/08/2015 1138   GLUCOSE 166 (H) 09/08/2015 1138   BUN 14 09/08/2015 1138   BUN 12 12/04/2013 0923   CREATININE 0.92 09/08/2015 1138   CALCIUM 9.0 09/08/2015 1138   PROT 6.4 12/04/2013 0923   ALBUMIN 4.1 12/04/2013 0923   AST 17 12/04/2013 0923   ALT 10 12/04/2013 0923   ALKPHOS 87 12/04/2013 0923   BILITOT 0.3 12/04/2013 0923   GFRNONAA >60 09/08/2015 1138   GFRAA >60 09/08/2015 1138      ASSESSMENT AND PLAN 45 y.o. year old female  has a past medical history of Acid reflux; Diabetes mellitus without complication (HCC); Fibromyalgia; MS (multiple sclerosis) (HCC); Sleep apnea; and Thyroid disease. here with:  1. Multiple sclerosis 2. Anxiety disorder  Overall the patient has remained stable. She will continue on Gilenya. We will repeat an MRI of the brain and cervical spine. Patient recently had blood work.  Will fax a copy to our office. Patient advised that if her symptoms worsen or she develops any new symptoms she should let us know. Follow-up in 6 months with Dr. Terrace ArabiaYan.     Butch PennyMegan Jilliam Bellmore, MSN, NP-C 07/23/2016, 9:58 AM [facility] [address], [address] [place], [place] [address] [phone]

## 2016-07-23 NOTE — Patient Instructions (Signed)
Continue Gilenya Fax over lab work MRI brain and spine If your symptoms worsen or you develop new symptoms please let us know.

## 2016-07-30 NOTE — Progress Notes (Signed)
I have reviewed and agreed above plan. 

## 2016-08-10 ENCOUNTER — Ambulatory Visit
Admission: RE | Admit: 2016-08-10 | Discharge: 2016-08-10 | Disposition: A | Discharge status: Home / Self Care | Payer: Medicare PPO | Source: Ambulatory Visit | Attending: Adult Health | Admitting: Adult Health

## 2016-08-10 DIAGNOSIS — G35 Multiple sclerosis: Secondary | ICD-10-CM | POA: Diagnosis not present

## 2016-08-10 MED ORDER — GADOBENATE DIMEGLUMINE 529 MG/ML IV SOLN
20.0000 mL | Freq: Once | INTRAVENOUS | Status: AC | PRN
  Administered 2016-08-10: 20 mL via INTRAVENOUS

## 2016-08-13 ENCOUNTER — Telehealth: Payer: Self-pay | Admitting: *Deleted

## 2016-08-13 NOTE — Telephone Encounter (Signed)
-----   Message from Butch Penny, NP sent at 08/13/2016  7:55 AM EST ----- No interval changes since last scan 09/08/15

## 2016-08-13 NOTE — Telephone Encounter (Signed)
Called and spoke to pt about imaging results per MM.NP. She stated she is still having hand tingling that she spoke with MM,NP about at last visit. She is wondering if she needs to change to different medication other than Gilenya. Her tremor is still bad. She is continuing to take baclofen. She will sometimes take an extra 10mg  to help. She has numbness below elbows still that is intermittent.   I placed pt on hold and spoke with MM,NP while pt on hold. I relayed per MM,NP that since imaging showed no changes, no need to change Gilenya medication. She can keep her f/u on 11/28/16 with Dr [name]. Pt agreeable to this.  Advised pt to call if she has new/worsening sx and we can try and get her in sooner to see Dr [name]. She verbalized understanding.

## 2016-08-13 NOTE — Telephone Encounter (Signed)
LVM for pt to call about results. Gave [facility] phone number.  

## 2016-08-13 NOTE — Telephone Encounter (Signed)
Patient returned Emma's call. Please call [phone].

## 2016-09-05 ENCOUNTER — Other Ambulatory Visit: Payer: Self-pay | Admitting: Neurology

## 2016-09-19 ENCOUNTER — Other Ambulatory Visit: Payer: Self-pay | Admitting: Neurology

## 2016-09-20 ENCOUNTER — Telehealth: Payer: Self-pay | Admitting: *Deleted

## 2016-09-20 NOTE — Telephone Encounter (Signed)
Gilenya PA approved by [facility] [phone]) through 09/20/2018.  Pt LO#[id number].

## 2016-09-30 ENCOUNTER — Other Ambulatory Visit: Payer: Self-pay | Admitting: Neurology

## 2016-10-29 ENCOUNTER — Telehealth: Payer: Self-pay | Admitting: *Deleted

## 2016-10-29 NOTE — Telephone Encounter (Signed)
Labs received from PCP, Dr. Louis Meckel (collected on 10/08/16):  CBC w/ DIFF:  MCH - 25 L MCHC - 30.2 L MPV - 10.6 H NE% - 81.4 H LY% - 6.6 L  All other values wnl.

## 2016-11-14 NOTE — Telephone Encounter (Signed)
Received another fax from Dr. Louis Meckel (collected on 10-08-16):  CMP:  All normal values except   ALT: 13 L

## 2016-11-14 NOTE — Telephone Encounter (Signed)
noted 

## 2016-11-26 ENCOUNTER — Ambulatory Visit (INDEPENDENT_AMBULATORY_CARE_PROVIDER_SITE_OTHER): Payer: Medicare PPO | Admitting: Neurology

## 2016-11-26 ENCOUNTER — Encounter: Payer: Self-pay | Admitting: Neurology

## 2016-11-26 VITALS — BP 114/70 | HR 91 | Ht 64.5 in | Wt 318.0 lb

## 2016-11-26 DIAGNOSIS — G35 Multiple sclerosis: Secondary | ICD-10-CM | POA: Diagnosis not present

## 2016-11-26 DIAGNOSIS — R2681 Unsteadiness on feet: Secondary | ICD-10-CM | POA: Diagnosis not present

## 2016-11-26 DIAGNOSIS — M545 Low back pain: Secondary | ICD-10-CM

## 2016-11-26 DIAGNOSIS — G8929 Other chronic pain: Secondary | ICD-10-CM

## 2016-11-26 NOTE — Progress Notes (Signed)
PATIENT: [name] DOB: 1970-11-06  REASON FOR VISIT: follow up- multiple sclerosis HISTORY FROM: patient  HISTORY OF PRESENT ILLNESS: Lunafreya is a 46 year-old right-handed [place] American single female from [place], [place], she was diagnosed with multiple sclerosis 10/2004 with positive MRI of the brain and 6 oligoclonal bands in CSF. She was patient of Dr. Sandria Manly. She was begun on Avonex. 03/12/2006 she had new cervical medullary lesion and was changed to rebif. She is in the EPOC study with Gilenya and began the medication 05/24/10. She is not having any side effects from the medication. She had a normal examination at the [facility] checking for macular edema on 08/17/10.   06/25/11 MRI of the brain and cervical spine showed a few scattered supratentorial nonspecific foci of T2 hyperintensity without enhancement and no change versus 04/19/09. There was disc bulging at C5-6 and C6-7 without cord lesions present and no change versus 06/16/08.   She also has hypothyroidism, diabetes, obstructive sleep apnea She is using CPAP At 13 cm of water with ESS 3. She had pulmonary function tests because of decreased breath sounds which were normal. 07/08/2012 she awoke with nausea and shaking of her right hand and arm, and inability to speak. Her right leg "did not want to go". The following day her speech was hesitant but well-formed. She was treated by iv followed by po steroid.   UPDATE June 5th 2014:  Since her last visit in May 20 third, she had multiple phone call to our office, with constellation of complains, including increased bilateral lower extremity tremor, gait difficulty, continued language difficulty, she is taking baclofen 20 mg 3 times a day, Xanax 0. 2 5 mg twice a day per instruction of on-call physician.   UPDATE July 7th 2014 She presented with progressive weakness of left lower extremity and difficulty with walking as well as  progression of tremor affecting right extremities more than left extremities and also affecting her speech. She was admitted for a course of Solu-Medrol intravenously, 500 mg every 12 hours x6 from June 16th to June 18th, which has been very helpful. She wants home physical therapy MRI of brain showed few periventricular and subcortical T2 hyperintensities. Some of these are hypointense on T1 views. No abnormal lesions are seen on post contrast views   She was seen by [name] in March 3rd, reported that In February 28 she awakened with a numbness in her right leg and right arm and weakness having trouble with her ambulation. She is having problems with her speech. She received five days of iv solumedrol, which did help her symptoms moderately, but she complains of upset stomach, loss of appetite, elevated glucose, mother complains of labile emotion  UPDATE Sep 13 2015: She presented with increased right side weakness in early Dec 2016, she was admitted to hospital in September 08 2015, there was no evidence of urinary tract infection, she was given a dose of Solu-Medrol, which did help her symptoms some  I have reviewed MRI brain: No acute infarct or evidence of active demyelination. Multiple sclerosis with single new right frontal plaque compared to brain MRI 03/04/2014.  MRI cervical Moderately motion degraded examination. No definite spinal cord lesion identified. Mild disc and facet degeneration   I reviewed laboratory evaluation September 08 2015 showed normal CBC, Hg 11.3  Update November 26 2016: She is doing well there was no recurrent multiple sclerosis flareup, we have personally reviewed MRI of the brain and  cervical spine in November 2017, mild multilevel cervical degenerative changes, no canal stenosis, no cervical spinal cord signal abnormality.  MRI of the brain showed scattered T2/FLAIR hyperintensity signal at juxtacortical, periventricular deep white matters, no contrast  enhancement no change compared to previous scan in December 2016. She complains of gait abnormality, low back pain, right hip pain, is taking chronic narcotics pain medications.   fatigue, taking provigil 200 mg daily   Reviewed laboratory evaluation on October 08 2016, CBC showed no significant abnormality. Normal CMP  MCH - 25 L MCHC - 30.2 L MPV - 10.6 H NE% - 81.4 H LY% - 6.6 L  REVIEW OF SYSTEMS: Out of a complete 14 system review of symptoms, the patient complains only of the following symptoms, and all other reviewed systems are negative.  flushing, shortness of breath, chronic low back pain insomnia, apnea, achy muscles, speech difficulty, tremor    ALLERGIES: No Known Allergies  HOME MEDICATIONS: Outpatient Medications Prior to Visit  Medication Sig Dispense Refill  . ALPRAZolam (XANAX) 0.25 MG tablet TAKE 1 TABLET BY MOUTH TWICE DAILY 60 tablet 5  . amLODipine (NORVASC) 5 MG tablet Take 5 mg by mouth daily.   0  . baclofen (LIORESAL) 20 MG tablet TAKE 1 TABLET BY MOUTH THREE TIMES DAILY 90 tablet 11  . cetirizine (ZYRTEC) 10 MG tablet Take 10 mg by mouth daily.    Marland Kitchen docusate sodium (COLACE) 100 MG capsule Take 100 mg by mouth 2 (two) times daily.     . DULoxetine (CYMBALTA) 60 MG capsule Take 60 mg by mouth daily.    . ferrous sulfate 325 (65 FE) MG tablet Take 325 mg by mouth daily with breakfast.    . FLUoxetine (PROZAC) 20 MG capsule TAKE 1 CAPSULE BY MOUTH EVERY DAY 90 capsule 3  . fluticasone (FLONASE) 50 MCG/ACT nasal spray Place 2 sprays into the nose daily.    Marland Kitchen GILENYA 0.5 MG CAPS TAKE 1 CAPSULE BY MOUTH DAILY 30 capsule 11  . levothyroxine (SYNTHROID, LEVOTHROID) 112 MCG tablet     . LYRICA 150 MG capsule Take 1 capsule by mouth daily.  2  . meloxicam (MOBIC) 15 MG tablet Take 15 mg by mouth daily as needed for pain.    . modafinil (PROVIGIL) 200 MG tablet TAKE 1 TABLET BY MOUTH DAILY 30 tablet 5  . Multiple Vitamins-Minerals (MULTIVITAMIN PO) Take 1 tablet by  mouth daily.     Marland Kitchen oxyCODONE-acetaminophen (PERCOCET) 7.5-325 MG per tablet Take 1 tablet by mouth 2 (two) times daily as needed for moderate pain.     . pantoprazole (PROTONIX) 40 MG tablet Take 1 tablet (40 mg total) by mouth daily. 30 tablet 1  . polyethylene glycol (MIRALAX / GLYCOLAX) packet Take 17 g by mouth daily.     . Cholecalciferol (VITAMIN D PO) Take 2,000 Units by mouth daily.     Marland Kitchen morphine (KADIAN) 60 MG 24 hr capsule      No facility-administered medications prior to visit.     PAST MEDICAL HISTORY: Past Medical History:  Diagnosis Date  . Acid reflux   . Diabetes mellitus without complication (HCC)    states she was "prediabetic" at one time  . Fibromyalgia   . MS (multiple sclerosis) (HCC)   . Sleep apnea   . Thyroid disease     PAST SURGICAL HISTORY: Past Surgical History:  Procedure Laterality Date  . ENDOMETRIAL BIOPSY    . HERNIA REPAIR      FAMILY  HISTORY: Family History  Problem Relation Age of Onset  . Hypertension Mother   . Migraines Mother   . High blood pressure Father   . Lung cancer Father     SOCIAL HISTORY: Social History   Social History  . Marital status: Divorced    Spouse name: N/A  . Number of children: 1  . Years of education: college   Occupational History  .  Unemployed    disabled   Social History Main Topics  . Smoking status: Former Smoker    Quit date: 10/01/1988  . Smokeless tobacco: Never Used  . Alcohol use No     Comment: Quit alcohol consumption in 1990's  . Drug use: No  . Sexual activity: Not on file   Other Topics Concern  . Not on file   Social History Narrative   She lives with her mother, daughter, she is on disability.    Right handed.   Education college.   Caffeine soda 2 daily      PHYSICAL EXAM  Vitals:   11/26/16 0914  BP: 114/70  Pulse: 91  Weight: (!) 318 lb (144.2 kg)  Height: 5' 4.5" (1.638 m)   Body mass index is 53.74 kg/m.  Generalized: Well developed, in no acute  distress   Neurological examination  Mentation: Alert oriented to time, place, history taking. Follows all commands speech and language fluent. Cranial nerve II-XII: Pupils were equal round reactive to light. Extraocular movements were full, visual field were full on confrontational test. Facial sensation and strength were normal. Uvula tongue midline. Head turning and shoulder shrug  were normal and symmetric. Motor: The motor testing reveals 5 over 5 strength of all 4 extremities. Good symmetric motor tone is noted throughout. No tremor noted. Sensory: Sensory testing is intact to soft touch on all 4 extremities. No evidence of extinction is noted.  Coordination: Cerebellar testing reveals good finger-nose-finger and heel-to-shin bilaterally.  Gait and station: Gait is normal. Slightly stooped posture. Tandem gait is normal. Romberg is negative. No drift is seen.  Reflexes: Deep tendon reflexes are symmetric and normal bilaterally.   DIAGNOSTIC DATA (LABS, IMAGING, TESTING) - I reviewed patient records, labs, notes, testing and imaging myself where available.  Lab Results  Component Value Date   WBC 5.9 09/08/2015   HGB 11.3 (L) 09/08/2015   HCT 35.5 (L) 09/08/2015   MCV 84.1 09/08/2015   PLT 232 09/08/2015      Component Value Date/Time   NA 140 09/08/2015 1138   NA 142 12/04/2013 0923   K 3.8 09/08/2015 1138   CL 103 09/08/2015 1138   CO2 29 09/08/2015 1138   GLUCOSE 166 (H) 09/08/2015 1138   BUN 14 09/08/2015 1138   BUN 12 12/04/2013 0923   CREATININE 0.92 09/08/2015 1138   CALCIUM 9.0 09/08/2015 1138   PROT 6.4 12/04/2013 0923   ALBUMIN 4.1 12/04/2013 0923   AST 17 12/04/2013 0923   ALT 10 12/04/2013 0923   ALKPHOS 87 12/04/2013 0923   BILITOT 0.3 12/04/2013 0923   GFRNONAA >60 09/08/2015 1138   GFRAA >60 09/08/2015 1138      ASSESSMENT AND PLAN 46 y.o. year old female  [name]  Taking Gilenya 0.5 milligrams daily,  Repeat MRI of  the brain with and without contrast in November 2017 showed no significant changes  Chronic gait abnormality  Multifactorial, obesity, deconditioning, right hip pain, low back pain  Continue pain management Fatigue  Continue provigil 200 mg daily  Levert Feinstein, M.D. Ph.D.  [facility] [address] [place], [place] [address] Phone: [phone] Fax:      [phone]

## 2017-02-07 ENCOUNTER — Telehealth: Payer: Self-pay | Admitting: Neurology

## 2017-02-07 NOTE — Telephone Encounter (Signed)
Spoke to patient - states these symptoms have been present for at least two weeks.  She was last here in in February 2018.  Dr. [name] has reviewed her chart and would like for her to be physically evaluated in the office.  Her schedule is full and she would like to have her seen by NP.  She has been added to Megan's schedule on 02/11/17 at 2:30pm.  Patient asked to arrive at 2:00pm.

## 2017-02-07 NOTE — Telephone Encounter (Signed)
Patient called office in reference to a relapse having "messed up" speech, body is still but feels that her brain is shaking inside, patient states she has been stumpling.  Been going on for 2 weeks.  Please call

## 2017-02-08 ENCOUNTER — Other Ambulatory Visit: Payer: Self-pay | Admitting: Neurology

## 2017-02-11 ENCOUNTER — Encounter: Payer: Self-pay | Admitting: Adult Health

## 2017-02-11 ENCOUNTER — Ambulatory Visit (INDEPENDENT_AMBULATORY_CARE_PROVIDER_SITE_OTHER): Payer: Medicare PPO | Admitting: Adult Health

## 2017-02-11 VITALS — BP 140/68 | HR 92 | Resp 20 | Ht 64.5 in | Wt 318.0 lb

## 2017-02-11 DIAGNOSIS — G35 Multiple sclerosis: Secondary | ICD-10-CM | POA: Diagnosis not present

## 2017-02-11 DIAGNOSIS — R479 Unspecified speech disturbances: Secondary | ICD-10-CM | POA: Diagnosis not present

## 2017-02-11 DIAGNOSIS — R251 Tremor, unspecified: Secondary | ICD-10-CM

## 2017-02-11 NOTE — Patient Instructions (Addendum)
Continue Gilenya Have blood work results faxed to our office If your symptoms worsen or you develop new symptoms please let us know.

## 2017-02-11 NOTE — Progress Notes (Signed)
I have reviewed and agreed above plan. 

## 2017-02-11 NOTE — Progress Notes (Signed)
PATIENT: [name] DOB: [birth year]  REASON FOR VISIT: follow up- multiple sclerosis HISTORY FROM: patient  HISTORY OF PRESENT ILLNESS: History Copied from Dr. Zannie George notes: [name] is Brooke 46 year-old right-handed African American single female from [place], [place], she was diagnosed with multiple sclerosis 10/2004 with positive MRI of the brain and 6 oligoclonal bands in CSF. She was patient of Dr. Sandria George. She was begun on Avonex. 03/12/2006 she had new cervical medullary lesion and was changed to rebif. She is in the EPOC study with Gilenya and began the medication 05/24/10. She is not having any side effects from the medication. She had Brooke [name] checking for macular edema on 08/17/10.   06/25/11 MRI of the brain and cervical spine showed Brooke few scattered supratentorial nonspecific foci of T2 hyperintensity without enhancement and no change versus 04/19/09. There was disc bulging at C5-6 and C6-7 without cord lesions present and no change versus 06/16/08.   She also has hypothyroidism, diabetes, obstructive sleep apnea She is using CPAP At 13 cm of water with ESS 3. She had pulmonary function tests because of decreased breath sounds which were normal. 07/08/2012 she awoke with nausea and shaking of her right hand and arm, and inability to speak. Her right leg "did not want to go". The following day her speech was hesitant but well-formed. She was treated by iv followed by po steroid.   UPDATE June 5th 2014:  Since her last visit in May 20 third, she had multiple phone call to our office, with constellation of complains, including increased bilateral lower extremity tremor, gait difficulty, continued language difficulty, she is taking baclofen 20 mg 3 times Brooke day, Xanax 0. 2 5 mg twice Brooke day per instruction of on-call physician.   UPDATE July 7th 2014 She presented with progressive weakness of left lower extremity and  difficulty with walking as well as progression of tremor affecting right extremities more than left extremities and also affecting her speech. She was admitted for Brooke course of Solu-Medrol intravenously, 500 mg every 12 hours x6 from June 16th to June 18th, which has been very helpful. She wants home physical therapy MRI of brain showed few periventricular and subcortical T2 hyperintensities. Some of these are hypointense on T1 views. No abnormal lesions are seen on post contrast views   She was seen by [name] in March 3rd, reported that In February 28 she awakened with Brooke numbness in her right leg and right arm and weakness having trouble with her ambulation. She is having problems with her speech. She received five days of iv solumedrol, which did help her symptoms moderately, but she complains of upset stomach, loss of appetite, elevated glucose, mother complains of labile emotion  UPDATE Sep 13 2015: She presented with increased right side weakness in early Dec 2016, she was admitted to hospital in September 08 2015, there was no evidence of urinary tract infection, she was given Brooke dose of Solu-Medrol, which did help her symptoms some  I have reviewed MRI brain: No acute infarct or evidence of active demyelination.Multiple sclerosis with single new right frontal plaque comparedto brain MRI 03/04/2014.  MRI cervical Moderately motion degraded examination. No definite spinal cord lesion identified. Mild disc and facet degeneration   I reviewed laboratory evaluation September 08 2015 showed normal CBC, Hg 11.3  Update November 26 2016: She is doing well there was no recurrent multiple sclerosis flareup, we have personally reviewed MRI  of the brain and cervical spine in November 2017, mild multilevel cervical degenerative changes, no canal stenosis, no cervical spinal cord signal abnormality.  MRI of the brain showed scattered T2/FLAIR hyperintensity signal at juxtacortical, periventricular  deep white matters, no contrast enhancement no change compared to previous scan in December 2016. She complains of gait abnormality, low back pain, right hip pain, is taking chronic narcotics pain medications.   fatigue, taking provigil 200 mg daily   Reviewed laboratory evaluation on October 08 2016, CBC showed no significant abnormality. Normal CMP  MCH - 25 L MCHC - 30.2 L MPV - 10.6 H NE% - 81.4 H LY% - 6.6 L  Today 02/11/17: Ms. [name] is Brooke 46 year old female with Brooke history of multiple sclerosis. She returns today for follow-up. She is currently on Gilenya and tolerates it well. She returns today after developing Brooke tremor, difficulty with her speech and trouble with ambulation approximately one and Brooke half weeks ago. She states this is typically how her relapse begins. She reports that in the past she's been given steroids and her symptoms resolved. She states that the tremors primarily in her head and is nonstop. She has been having to write down her thoughts due to difficulties speech. She is using Brooke cane when ambulating. She denies any new numbness. She does report weakness in the legs. She reports that she went to her primary care on Friday and was diagnosed with Brooke urinary tract infection. She was started on antibiotics Friday. She returns today for an evaluation.   REVIEW OF SYSTEMS: Out of Brooke complete 14 system review of symptoms, the patient complains only of the following symptoms, and all other reviewed systems are negative.  Back pain, aching muscles, walking difficulty, apnea, blurred vision, fatigue, speech difficulty, weakness, tremors, anxious  ALLERGIES: No Known Allergies  HOME MEDICATIONS: Outpatient Medications Prior to Visit  Medication Sig Dispense Refill  . ALPRAZolam (XANAX) 0.25 MG tablet TAKE 1 TABLET BY MOUTH TWICE DAILY 60 tablet 5  . amLODipine (NORVASC) 5 MG tablet Take 5 mg by mouth daily.   0  . baclofen (LIORESAL) 20 MG tablet TAKE 1 TABLET BY MOUTH  THREE TIMES DAILY 90 tablet 11  . cetirizine (ZYRTEC) 10 MG tablet Take 10 mg by mouth daily.    Marland Kitchen docusate sodium (COLACE) 100 MG capsule Take 100 mg by mouth 2 (two) times daily.     . DULoxetine (CYMBALTA) 60 MG capsule Take 60 mg by mouth daily.    . EMBEDA 60-2.4 MG CPCR 2 (two) times daily.  0  . ferrous sulfate 325 (65 FE) MG tablet Take 325 mg by mouth daily with breakfast.    . FLUoxetine (PROZAC) 20 MG capsule TAKE 1 CAPSULE BY MOUTH EVERY DAY 90 capsule 3  . fluticasone (FLONASE) 50 MCG/ACT nasal spray Place 2 sprays into the nose daily.    Marland Kitchen GILENYA 0.5 MG CAPS TAKE 1 CAPSULE BY MOUTH DAILY 30 capsule 11  . levothyroxine (SYNTHROID, LEVOTHROID) 112 MCG tablet     . meloxicam (MOBIC) 15 MG tablet Take 15 mg by mouth daily as needed for pain.    . modafinil (PROVIGIL) 200 MG tablet TAKE 1 TABLET BY MOUTH DAILY 30 tablet 5  . Multiple Vitamins-Minerals (MULTIVITAMIN PO) Take 1 tablet by mouth daily.     Marland Kitchen oxyCODONE-acetaminophen (PERCOCET) 7.5-325 MG per tablet Take 1 tablet by mouth 2 (two) times daily as needed for moderate pain.     . pantoprazole (PROTONIX) 40 MG  tablet Take 1 tablet (40 mg total) by mouth daily. 30 tablet 1  . polyethylene glycol (MIRALAX / GLYCOLAX) packet Take 17 g by mouth daily.     Marland Kitchen LYRICA 150 MG capsule Take 1 capsule by mouth daily.  2  . Vitamin D, Ergocalciferol, (DRISDOL) 50000 units CAPS capsule once Brooke week.  2   No facility-administered medications prior to visit.     PAST MEDICAL HISTORY: Past Medical History:  Diagnosis Date  . Acid reflux   . Diabetes mellitus without complication (HCC)    states she was "prediabetic" at one time  . Fibromyalgia   . MS (multiple sclerosis) (HCC)   . Sleep apnea   . Thyroid disease     PAST SURGICAL HISTORY: Past Surgical History:  Procedure Laterality Date  . ENDOMETRIAL BIOPSY    . HERNIA REPAIR      FAMILY HISTORY: Family History  Problem Relation Age of Onset  . Hypertension Mother   .  Migraines Mother   . High blood pressure Father   . Lung cancer Father     SOCIAL HISTORY: Social History   Social History  . Marital status: Divorced    Spouse name: N/Brooke  . Number of children: 1  . Years of education: college   Occupational History  .  Unemployed    disabled   Social History Main Topics  . Smoking status: Former Smoker    Quit date: 10/01/1988  . Smokeless tobacco: Never Used  . Alcohol use No     Comment: Quit alcohol consumption in 1990's  . Drug use: No  . Sexual activity: Not on file   Other Topics Concern  . Not on file   Social History Narrative   She lives with her mother, daughter, she is on disability.    Right handed.   Education college.   Caffeine soda 2 daily      PHYSICAL EXAM  Vitals:   02/11/17 1410  BP: 140/68  Pulse: 92  Resp: 20  Weight: (!) 318 lb (144.2 kg)  Height: 5' 4.5" (1.638 m)   Body mass index is 53.74 kg/m.  Generalized: Well developed, in no acute distress   Neurological examination  Mentation: Alert oriented to time, place, history taking. Follows all commands. Difficulty with speech. Cranial nerve II-XII: Pupils were equal round reactive to light. Extraocular movements were full, visual field were full on confrontational test. Facial sensation and strength were normal. Uvula tongue midline. Head turning and shoulder shrug  were normal and symmetric. Motor: The motor testing reveals 5 over 5 strength of all 4 extremities. Good symmetric motor tone is noted throughout. Tremor in head. Sensory: Sensory testing is intact to soft touch on all 4 extremities. No evidence of extinction is noted.  Coordination: Cerebellar testing reveals good finger-nose-finger and heel-to-shin bilaterally.  Gait and station: Gait is normal. Tandem gait is normal. Romberg is negative. No drift is seen.  Reflexes: Deep tendon reflexes are symmetric and normal bilaterally.   DIAGNOSTIC DATA (LABS, IMAGING, TESTING) - I reviewed  patient records, labs, notes, testing and imaging myself where available.  Lab Results  Component Value Date   WBC 5.9 09/08/2015   HGB 11.3 (L) 09/08/2015   HCT 35.5 (L) 09/08/2015   MCV 84.1 09/08/2015   PLT 232 09/08/2015      Component Value Date/Time   NA 140 09/08/2015 1138   NA 142 12/04/2013 0923   K 3.8 09/08/2015 1138   CL 103 09/08/2015 1138  CO2 29 09/08/2015 1138   GLUCOSE 166 (H) 09/08/2015 1138   BUN 14 09/08/2015 1138   BUN 12 12/04/2013 0923   CREATININE 0.92 09/08/2015 1138   CALCIUM 9.0 09/08/2015 1138   PROT 6.4 12/04/2013 0923   ALBUMIN 4.1 12/04/2013 0923   AST 17 12/04/2013 0923   ALT 10 12/04/2013 0923   ALKPHOS 87 12/04/2013 0923   BILITOT 0.3 12/04/2013 0923   GFRNONAA >60 09/08/2015 1138   GFRAA >60 09/08/2015 1138    Lab Results  Component Value Date   HGBA1C 5.5 03/16/2013   No results found for: [id number] Lab Results  Component Value Date   TSH 0.138 (L) 03/16/2013      ASSESSMENT AND PLAN 46 y.o. year old female  has Brooke past medical history of Acid reflux; Diabetes mellitus without complication (HCC); Fibromyalgia; MS (multiple sclerosis) (HCC); Sleep apnea; and Thyroid disease. here with:  1. Multiple sclerosis 2. Difficulty speech 3. Tremor  The patient feels as if she is having exacerbation of her MS. She reports that she typically presents with difficulty speech and tremor. I consulted with Dr. [name] and we will monitor her symptoms for now. Dr. [name] was concerned that getting her prednisone may make her urinary tract infection worse. Patient is amenable to this plan. The patient also had repeat MRI in November that show no progression of her MS. If her symptoms worsen or do not resolve within the next week she will give [place] Brooke call. She will follow-up in 6 months with Dr. [name].     Butch Penny, MSN, NP-C 02/11/2017, 2:17 PM [facility] [address] [place], [place] [address] [phone]

## 2017-02-20 ENCOUNTER — Telehealth: Payer: Self-pay | Admitting: *Deleted

## 2017-02-20 NOTE — Telephone Encounter (Signed)
Pt [name] form @ front desk for pick up.

## 2017-02-21 NOTE — Telephone Encounter (Signed)
error 

## 2017-03-07 ENCOUNTER — Other Ambulatory Visit: Payer: Self-pay | Admitting: Neurology

## 2017-03-07 ENCOUNTER — Other Ambulatory Visit: Payer: Self-pay | Admitting: *Deleted

## 2017-03-07 MED ORDER — ALPRAZOLAM 0.25 MG PO TABS: 0.2500 mg | ORAL_TABLET | Freq: Two times a day (BID) | ORAL | 5 refills | Status: DC

## 2017-04-04 ENCOUNTER — Other Ambulatory Visit: Payer: Self-pay | Admitting: Neurology

## 2017-05-15 ENCOUNTER — Other Ambulatory Visit: Payer: Self-pay | Admitting: Neurology

## 2017-05-27 ENCOUNTER — Other Ambulatory Visit: Payer: Self-pay | Admitting: Neurology

## 2017-06-05 ENCOUNTER — Telehealth: Payer: Self-pay | Admitting: Adult Health

## 2017-06-05 NOTE — Telephone Encounter (Signed)
I received the patient's lab work from her PCP. All were unremarkable with the exception of:  Hemoglobin 11.1 MCHC 3.5 MPV 2.2 Urine creatinine 326.5

## 2017-08-05 ENCOUNTER — Other Ambulatory Visit: Payer: Self-pay | Admitting: Neurology

## 2017-08-14 ENCOUNTER — Encounter: Payer: Self-pay | Admitting: Adult Health

## 2017-08-14 ENCOUNTER — Ambulatory Visit: Payer: Medicare PPO | Admitting: Adult Health

## 2017-08-14 VITALS — BP 135/82 | HR 94 | Wt 320.2 lb

## 2017-08-14 DIAGNOSIS — R251 Tremor, unspecified: Secondary | ICD-10-CM | POA: Diagnosis not present

## 2017-08-14 DIAGNOSIS — G35 Multiple sclerosis: Secondary | ICD-10-CM

## 2017-08-14 NOTE — Progress Notes (Signed)
I have reviewed and agreed above plan. 

## 2017-08-14 NOTE — Progress Notes (Signed)
PATIENT: [name] DOB: [birth year]  REASON FOR VISIT: follow up-multiple sclerosis HISTORY FROM: patient  HISTORY OF PRESENT ILLNESS: History Copied from Dr. Zannie CoveYan's notes: Brooke LeeSabrina is a 46 year-old right-handed African American single [place], she was diagnosed with multiple sclerosis 10/2004 with positive MRI of the brain and 6 oligoclonal bands in CSF. She was patient of Dr. Sandria ManlyLove. She was begun on Avonex. 03/12/2006 she had new cervical medullary lesion and was changed to rebif. She is in the EPOC study with Gilenya and began the medication 05/24/10. She is not having any side effects from the medication. [facility] a [facility] checking for macular edema on 08/17/10.   06/25/11 MRI of the brain and cervical spine showed a few scattered supratentorial nonspecific foci of T2 hyperintensity without enhancement and no change versus 04/19/09. There was disc bulging at C5-6 and C6-7 without cord lesions present and no change versus 06/16/08.   She also has hypothyroidism, diabetes, obstructive sleep apnea She is using CPAP At 13 cm of water with ESS 3. She had pulmonary function tests because of decreased breath sounds which were normal. 07/08/2012 she awoke with nausea and shaking of her right hand and arm, and inability to speak. Her right leg "did not want to go". The following day her speech was hesitant but well-formed. She was treated by iv followed by po steroid.   UPDATE June 5th 2014:  Since her last visit in May 20 third, she had multiple phone call to our office, with constellation of complains, including increased bilateral lower extremity tremor, gait difficulty, continued language difficulty, she is taking baclofen 20 mg 3 times a day, Xanax 0. 2 5 mg twice a day per instruction of on-call physician.   UPDATE July 7th 2014 She presented with progressive weakness of left lower extremity and  difficulty with walking as well as progression of tremor affecting right extremities more than left extremities and also affecting her speech. She was admitted for a course of Solu-Medrol intravenously, 500 mg every 12 hours x6 from June 16th to June 18th, which has been very helpful. She wants home physical therapy MRI of brain showed few periventricular and subcortical T2 hyperintensities. Some of these are hypointense on T1 views. No abnormal lesions are seen on post contrast views   She was seen by Eber Jonesarolyn in March 3rd, reported that In February 28 she awakened with a numbness in her right leg and right arm and weakness having trouble with her ambulation. She is having problems with her speech. She received five days of iv solumedrol, which did help her symptoms moderately, but she complains of upset stomach, loss of appetite, elevated glucose, mother complains of labile emotion  UPDATE Sep 13 2015: She presented with increased right side weakness in early Dec 2016, she was admitted to hospital in September 08 2015, there was no evidence of urinary tract infection, she was given a dose of Solu-Medrol, which did help her symptoms some  I have reviewed MRI brain: No acute infarct or evidence of active demyelination.Multiple sclerosis with single new right frontal plaque comparedto brain MRI 03/04/2014.  MRI cervical Moderately motion degraded examination. No definite spinal cord lesion identified. Mild disc and facet degeneration   I reviewed laboratory evaluation September 08 2015 showed normal CBC, Hg 11.3  Update November 26 2016: She is doing well there was no recurrent multiple sclerosis flareup, we have personally reviewed MRI of  the brain and cervical spine in November 2017, mild multilevel cervical degenerative changes, no canal stenosis, no cervical spinal cord signal abnormality.  MRI of the brain showed scattered T2/FLAIR hyperintensity signal at juxtacortical, periventricular  deep white matters, no contrast enhancement no change compared to previous scan in December 2016. She complains of gait abnormality, low back pain, right hip pain, is taking chronic narcotics pain medications.  fatigue, taking provigil 200 mg daily   Reviewed laboratory evaluation on October 08 2016, CBC showed no significant abnormality. Normal CMP  MCH - 25 L MCHC - 30.2 L MPV - 10.6 H NE% - 81.4 H LY% - 6.6 L  UPDATE 02/11/17: [name] is a 46 year old female with a history of multiple sclerosis. She returns today for follow-up. She is currently on Gilenya and tolerates it well. She returns today after developing a tremor, difficulty with her speech and trouble with ambulation approximately one and a half weeks ago. She states this is typically how her relapse begins. She reports that in the past she's been given steroids and her symptoms resolved. She states that the tremors primarily in her head and is nonstop. She has been having to write down her thoughts due to difficulties speech. She is using a cane when ambulating. She denies any new numbness. She does report weakness in the legs. She reports that she went to her primary care on Friday and was diagnosed with a urinary tract infection. She was started on antibiotics Friday. She returns today for an evaluation.   Today 08/14/17   [name] is a 46 year old female with a history of multiple sclerosis.  She returns today for follow-up.  She remains on Gilenya and is tolerating it well.  She denies any changes with her gait or balance.  Denies any changes with her vision.  No change in her bowels or bladder.  She reports on occasion she has intermittent right hand numbness.  She reports that this started about 2 months ago.  She reports that the symptom is not consistent.  She also states that times she feels that she has a tremor.  She feels that it starts at the base of the neck and affects her head.  She states that she  has never looked in the mirror to see if she actually has a tremor.  She does state in the past she has struggled with anxiety.  She returns today for an evaluation.     REVIEW OF SYSTEMS: Out of a complete 14 system review of symptoms, the patient complains only of the following symptoms, and all other reviewed systems are negative.  Insomnia, aching muscles, apnea, back pain, nervous/anxious, speech difficulty, tremors, numbness, shortness of breath, heat intolerance  ALLERGIES: No Known Allergies  HOME MEDICATIONS: Outpatient Medications Prior to Visit  Medication Sig Dispense Refill  . ALPRAZolam (XANAX) 0.25 MG tablet Take 1 tablet (0.25 mg total) by mouth 2 (two) times daily. #60 must last 30 days with no early refills. 60 tablet 5  . amLODipine (NORVASC) 5 MG tablet Take 5 mg by mouth daily.   0  . baclofen (LIORESAL) 20 MG tablet TAKE 1 TABLET BY MOUTH THREE TIMES DAILY 90 tablet 11  . cetirizine (ZYRTEC) 10 MG tablet Take 10 mg by mouth daily.    Marland Kitchen docusate sodium (COLACE) 100 MG capsule Take 100 mg by mouth 2 (two) times daily.     . DULoxetine (CYMBALTA) 60 MG capsule Take 60 mg by mouth daily.    Marland Kitchen  EMBEDA 60-2.4 MG CPCR 2 (two) times daily.  0  . ferrous sulfate 325 (65 FE) MG tablet Take 325 mg by mouth daily with breakfast.    . FLUoxetine (PROZAC) 20 MG capsule TAKE 1 CAPSULE BY MOUTH EVERY DAY 90 capsule 3  . FLUoxetine (PROZAC) 20 MG capsule TAKE 1 CAPSULE BY MOUTH EVERY DAY 90 capsule 3  . fluticasone (FLONASE) 50 MCG/ACT nasal spray Place 2 sprays into the nose daily.    Marland Kitchen GILENYA 0.5 MG CAPS TAKE 1 CAPSULE BY MOUTH EVERY DAY 30 capsule 10  . hydrochlorothiazide (HYDRODIURIL) 12.5 MG tablet TK 1 T PO D  2  . levothyroxine (SYNTHROID, LEVOTHROID) 112 MCG tablet     . LYRICA 200 MG capsule TK ONE C PO HS  2  . MAGNESIUM-OXIDE 400 (241.3 Mg) MG tablet TK 1 T PO D  2  . meloxicam (MOBIC) 15 MG tablet Take 15 mg by mouth daily as needed for pain.    . modafinil  (PROVIGIL) 200 MG tablet TAKE 1 TABLET BY MOUTH EVERY DAY 30 tablet 5  . Multiple Vitamins-Minerals (MULTIVITAMIN PO) Take 1 tablet by mouth daily.     Marland Kitchen oxyCODONE-acetaminophen (PERCOCET) 7.5-325 MG per tablet Take 1 tablet by mouth 2 (two) times daily as needed for moderate pain.     . pantoprazole (PROTONIX) 40 MG tablet Take 1 tablet (40 mg total) by mouth daily. 30 tablet 1  . polyethylene glycol (MIRALAX / GLYCOLAX) packet Take 17 g by mouth daily.     Marland Kitchen sulfamethoxazole-trimethoprim (BACTRIM DS,SEPTRA DS) 800-160 MG tablet TK 1 T PO BID  0   No facility-administered medications prior to visit.     PAST MEDICAL HISTORY: Past Medical History:  Diagnosis Date  . Acid reflux   . Diabetes mellitus without complication (HCC)    states she was "prediabetic" at one time  . Fibromyalgia   . MS (multiple sclerosis) (HCC)   . Sleep apnea   . Thyroid disease     PAST SURGICAL HISTORY: Past Surgical History:  Procedure Laterality Date  . ENDOMETRIAL BIOPSY    . HERNIA REPAIR      FAMILY HISTORY: Family History  Problem Relation Age of Onset  . Hypertension Mother   . Migraines Mother   . High blood pressure Father   . Lung cancer Father     SOCIAL HISTORY: Social History   Socioeconomic History  . Marital status: Divorced    Spouse name: Not on file  . Number of children: 1  . Years of education: college  . Highest education level: Not on file  Social Needs  . Financial resource strain: Not on file  . Food insecurity - worry: Not on file  . Food insecurity - inability: Not on file  . Transportation needs - medical: Not on file  . Transportation needs - non-medical: Not on file  Occupational History    Employer: UNEMPLOYED    Comment: disabled  Tobacco Use  . Smoking status: Former Smoker    Last attempt to quit: 10/01/1988    Years since quitting: 28.8  . Smokeless tobacco: Never Used  Substance and Sexual Activity  . Alcohol use: No    Alcohol/week: 0.0 oz     Comment: Quit alcohol consumption in 1990's  . Drug use: No  . Sexual activity: Not on file  Other Topics Concern  . Not on file  Social History Narrative   She lives with her mother, daughter, she is on disability.  Right handed.   Education college.   Caffeine soda 2 daily      PHYSICAL EXAM  Vitals:   08/14/17 0850  BP: 135/82  Pulse: 94  Weight: (!) 320 lb 3.2 oz (145.2 kg)   Body mass index is 54.11 kg/m.  Generalized: Well developed, in no acute distress   Neurological examination  Mentation: Alert oriented to time, place, history taking. Follows all commands speech and language fluent Cranial nerve II-XII: Pupils were equal round reactive to light. Extraocular movements were full, visual field were full on confrontational test. Facial sensation and strength were normal. Uvula tongue midline. Head turning and shoulder shrug  were normal and symmetric. Motor: The motor testing reveals 5 over 5 strength of all 4 extremities. Good symmetric motor tone is noted throughout.  Sensory: Sensory testing is intact to soft touch on all 4 extremities. No evidence of extinction is noted.  Coordination: Cerebellar testing reveals good finger-nose-finger and heel-to-shin bilaterally.  Gait and station: Slightly stooped posture when ambulating.  Gait is slightly wide-based.  Tandem gait is slightly unsteady.  Romberg is negative. Reflexes: Deep tendon reflexes are symmetric and normal bilaterally.   DIAGNOSTIC DATA (LABS, IMAGING, TESTING) - I reviewed patient records, labs, notes, testing and imaging myself where available.  Lab Results  Component Value Date   WBC 5.9 09/08/2015   HGB 11.3 (L) 09/08/2015   HCT 35.5 (L) 09/08/2015   MCV 84.1 09/08/2015   PLT 232 09/08/2015      Component Value Date/Time   NA 140 09/08/2015 1138   NA 142 12/04/2013 0923   K 3.8 09/08/2015 1138   CL 103 09/08/2015 1138   CO2 29 09/08/2015 1138   GLUCOSE 166 (H) 09/08/2015 1138   BUN 14  09/08/2015 1138   BUN 12 12/04/2013 0923   CREATININE 0.92 09/08/2015 1138   CALCIUM 9.0 09/08/2015 1138   PROT 6.4 12/04/2013 0923   ALBUMIN 4.1 12/04/2013 0923   AST 17 12/04/2013 0923   ALT 10 12/04/2013 0923   ALKPHOS 87 12/04/2013 0923   BILITOT 0.3 12/04/2013 0923   GFRNONAA >60 09/08/2015 1138   GFRAA >60 09/08/2015 1138    Lab Results  Component Value Date   HGBA1C 5.5 03/16/2013    Lab Results  Component Value Date   TSH 0.138 (L) 03/16/2013      ASSESSMENT AND PLAN 46 y.o. year old female  has a past medical history of Acid reflux, Diabetes mellitus without complication (HCC), Fibromyalgia, MS (multiple sclerosis) (HCC), Sleep apnea, and Thyroid disease. here with:  1.  Multiple sclerosis 2.  Tremor  The patient has remained relatively stable.  She will continue on Gilenya.  She will have blood work next month with her primary care.  She will have them fax over these results.  Her last MRI of the brain and cervical spine was 1 year ago.  We will repeat imaging to look for progression of MS.  There was no tremor noted on exam. She is advised that if her symptoms worsen or she develops new symptoms she should let us know.  She will follow-up in 6 months or sooner if needed.      Butch Penny, MSN, NP-C 08/14/2017, 8:49 AM [facility] [address], [address] [place], [place] [address] [phone]

## 2017-08-14 NOTE — Patient Instructions (Addendum)
Your Plan:  Continue Gilenya If your symptoms worsen or you develop new symptoms please let us know.   Thank you for coming to see us at [facility]. I hope we have been able to provide you high quality care today.  You may receive a patient satisfaction survey over the next few weeks. We would appreciate your feedback and comments so that we may continue to improve ourselves and the health of our patients.  

## 2017-08-20 ENCOUNTER — Other Ambulatory Visit: Payer: Self-pay | Admitting: Neurology

## 2017-08-31 ENCOUNTER — Ambulatory Visit
Admission: RE | Admit: 2017-08-31 | Discharge: 2017-08-31 | Disposition: A | Discharge status: Home / Self Care | Payer: Medicare PPO | Source: Ambulatory Visit | Attending: Adult Health | Admitting: Adult Health

## 2017-08-31 DIAGNOSIS — G35 Multiple sclerosis: Secondary | ICD-10-CM

## 2017-08-31 MED ORDER — GADOBENATE DIMEGLUMINE 529 MG/ML IV SOLN
20.0000 mL | Freq: Once | INTRAVENOUS | Status: AC | PRN
  Administered 2017-08-31: 20 mL via INTRAVENOUS

## 2017-09-02 ENCOUNTER — Telehealth: Payer: Self-pay | Admitting: *Deleted

## 2017-09-02 NOTE — Telephone Encounter (Signed)
I spoke to pt and relayed that her MRI brain relatively unchanged compared to previous exam. Also  MRI brain no significant change compared to previous scan.    She verbalized understanding.

## 2017-09-02 NOTE — Telephone Encounter (Signed)
-----   Message from Butch Penny, NP sent at 09/02/2017  9:48 AM EST ----- MRI of the brain is relatively unchanged compared to previous exam.  Call patient with results.

## 2017-09-06 ENCOUNTER — Other Ambulatory Visit: Payer: Self-pay | Admitting: Neurology

## 2017-10-08 ENCOUNTER — Other Ambulatory Visit: Payer: Self-pay | Admitting: Neurology

## 2017-10-21 ENCOUNTER — Telehealth: Payer: Self-pay | Admitting: *Deleted

## 2017-10-21 NOTE — Telephone Encounter (Signed)
Labs received from her PCP, Dr. Aviva Signs at [facility]. Collected: 10/14/17:  Ferritin: 40.50 wnl  Folate: 8.58 wnl  Iron Binding Capacity (TIBC): FE: 40 L UIBC: 385.00 wnl TIBC: 425.00 H  Vitamin B12: 397.00 wnl  CBC w/ Diff: HGB: 11.5 wnl MCH: 25 L MCHC: 30.7 L MPV: 10.4 H NE%: 82.8 H LY%: 6.5 L (all other values wnl)  TSH: 3.641 wnl  Blood Glucose: 88 wnl

## 2017-11-11 ENCOUNTER — Other Ambulatory Visit: Payer: Self-pay | Admitting: Neurology

## 2017-11-26 ENCOUNTER — Ambulatory Visit: Payer: Medicare PPO | Admitting: Nurse Practitioner

## 2018-02-11 ENCOUNTER — Ambulatory Visit: Payer: Medicare PPO | Admitting: Neurology

## 2018-02-11 ENCOUNTER — Telehealth: Payer: Self-pay

## 2018-02-11 ENCOUNTER — Encounter: Payer: Self-pay | Admitting: Neurology

## 2018-02-11 VITALS — BP 135/85 | HR 93 | Ht 64.5 in | Wt 329.0 lb

## 2018-02-11 DIAGNOSIS — F419 Anxiety disorder, unspecified: Secondary | ICD-10-CM | POA: Diagnosis not present

## 2018-02-11 DIAGNOSIS — G35 Multiple sclerosis: Secondary | ICD-10-CM | POA: Diagnosis not present

## 2018-02-11 MED ORDER — FLUOXETINE HCL 40 MG PO CAPS: 40.0000 mg | ORAL_CAPSULE | Freq: Every day | ORAL | 4 refills | Status: DC

## 2018-02-11 NOTE — Telephone Encounter (Signed)
JCV Index order copied and placed on Marcelino Duster, RN desk and lab specimen placed in [facility] pick up box.

## 2018-02-11 NOTE — Progress Notes (Signed)
PATIENT: [name] DOB: Aug 09, 1971  REASON FOR VISIT: follow up-multiple sclerosis HISTORY FROM: patient  HISTORY OF PRESENT ILLNESS:  [name] is a 47 year-old right-handed [place] American single female from [place], [place], she was diagnosed with multiple sclerosis 10/2004 with positive MRI of the brain and 6 oligoclonal bands in CSF. She was patient of Dr. Sandria Manly. She was begun on Avonex. 03/12/2006 she had new cervical medullary lesion and was changed to rebif. She is in the EPOC study with Gilenya and began the medication 05/24/10. She is not having any side effects from the medication. She had a normal examination at the [facility] checking for macular edema on 08/17/10.   06/25/11 MRI of the brain and cervical spine showed a few scattered supratentorial nonspecific foci of T2 hyperintensity without enhancement and no change versus 04/19/09. There was disc bulging at C5-6 and C6-7 without cord lesions present and no change versus 06/16/08.   She also has hypothyroidism, diabetes, obstructive sleep apnea She is using CPAP At 13 cm of water with ESS 3. She had pulmonary function tests because of decreased breath sounds which were normal. 07/08/2012 she awoke with nausea and shaking of her right hand and arm, and inability to speak. Her right leg "did not want to go". The following day her speech was hesitant but well-formed. She was treated by iv followed by po steroid.   UPDATE June 5th 2014:  Since her last visit in May 20 third, she had multiple phone call to our office, with constellation of complains, including increased bilateral lower extremity tremor, gait difficulty, continued language difficulty, she is taking baclofen 20 mg 3 times a day, Xanax 0. 2 5 mg twice a day per instruction of on-call physician.   UPDATE July 7th 2014 She presented with progressive weakness of left lower extremity and difficulty with walking as well as  progression of tremor affecting right extremities more than left extremities and also affecting her speech. She was admitted for a course of Solu-Medrol intravenously, 500 mg every 12 hours x6 from June 16th to June 18th, which has been very helpful. She wants home physical therapy MRI of brain showed few periventricular and subcortical T2 hyperintensities. Some of these are hypointense on T1 views. No abnormal lesions are seen on post contrast views   She was seen by [name] in March 3rd, reported that In February 28 she awakened with a numbness in her right leg and right arm and weakness having trouble with her ambulation. She is having problems with her speech. She received five days of iv solumedrol, which did help her symptoms moderately, but she complains of upset stomach, loss of appetite, elevated glucose, mother complains of labile emotion  UPDATE Sep 13 2015: She presented with increased right side weakness in early Dec 2016, she was admitted to hospital in September 08 2015, there was no evidence of urinary tract infection, she was given a dose of Solu-Medrol, which did help her symptoms some  I have reviewed MRI brain: No acute infarct or evidence of active demyelination.Multiple sclerosis with single new right frontal plaque comparedto brain MRI 03/04/2014.  MRI cervical Moderately motion degraded examination. No definite spinal cord lesion identified. Mild disc and facet degeneration   I reviewed laboratory evaluation September 08 2015 showed normal CBC, Hg 11.3  Update November 26 2016: She is doing well there was no recurrent multiple sclerosis flareup, we have personally reviewed MRI of the brain and cervical spine  in November 2017, mild multilevel cervical degenerative changes, no canal stenosis, no cervical spinal cord signal abnormality.  MRI of the brain showed scattered T2/FLAIR hyperintensity signal at juxtacortical, periventricular deep white matters, no contrast  enhancement no change compared to previous scan in December 2016. She complains of gait abnormality, low back pain, right hip pain, is taking chronic narcotics pain medications.  fatigue, taking provigil 200 mg daily   Reviewed laboratory evaluation on October 08 2016, CBC showed no significant abnormality. Normal CMP  MCH - 25 L MCHC - 30.2 L MPV - 10.6 H NE% - 81.4 H LY% - 6.6 L   UPDATE 02/11/18: She has been overall doing very well, there was no recurrent spells of MS exacerbation, tolerating Gilenya, is seen by her primary care physician on monthly basis, she continue have spells of anxiety, taking prozac 20 mg every day, she takes Xanax 0.25 mg twice a day as needed for anxiety and for insomnia,    REVIEW OF SYSTEMS: Out of a complete 14 system review of symptoms, the patient complains only of the following symptoms, and all other reviewed systems are negative.  Shortness of breath,Unexpected weight change, fatigue, and pain, back pain, achy muscles, muscle cramps, walking difficulty  ALLERGIES: No Known Allergies  HOME MEDICATIONS: Outpatient Medications Prior to Visit  Medication Sig Dispense Refill  . ALPRAZolam (XANAX) 0.25 MG tablet TAKE 1 TABLET BY MOUTH TWICE DAILY. MUST LAST 30 DAYS. 60 tablet 5  . amLODipine (NORVASC) 10 MG tablet Take 1 tablet by mouth daily.  2  . baclofen (LIORESAL) 20 MG tablet TAKE 1 TABLET BY MOUTH THREE TIMES DAILY 90 tablet 11  . cetirizine (ZYRTEC) 10 MG tablet Take 10 mg by mouth daily.    . Cholecalciferol (VITAMIN D) 2000 units CAPS Take 1 capsule by mouth daily.    Marland Kitchen docusate sodium (COLACE) 100 MG capsule Take 100 mg by mouth 2 (two) times daily.     . DULoxetine (CYMBALTA) 60 MG capsule Take 60 mg by mouth daily.    . EMBEDA 60-2.4 MG CPCR 2 (two) times daily.  0  . ferrous sulfate 325 (65 FE) MG tablet Take 325 mg by mouth daily with breakfast.    . FLUoxetine (PROZAC) 20 MG capsule TAKE 1 CAPSULE BY MOUTH EVERY DAY 90 capsule 3    . fluticasone (FLONASE) 50 MCG/ACT nasal spray Place 2 sprays into the nose daily.    Marland Kitchen GILENYA 0.5 MG CAPS TAKE 1 CAPSULE BY MOUTH EVERY DAY 30 capsule 10  . hydrochlorothiazide (HYDRODIURIL) 12.5 MG tablet TK 1 T PO D  2  . levothyroxine (SYNTHROID, LEVOTHROID) 125 MCG tablet Take 125 mcg by mouth daily before breakfast.    . LYRICA 200 MG capsule TK ONE C PO HS  2  . meloxicam (MOBIC) 15 MG tablet Take 15 mg by mouth daily as needed for pain.    . modafinil (PROVIGIL) 200 MG tablet TAKE 1 TABLET BY MOUTH EVERY DAY 90 tablet 1  . Multiple Vitamins-Minerals (MULTIVITAMIN PO) Take 1 tablet by mouth daily.     Marland Kitchen oxyCODONE-acetaminophen (PERCOCET) 7.5-325 MG per tablet Take 1 tablet by mouth 2 (two) times daily as needed for moderate pain.     . pantoprazole (PROTONIX) 40 MG tablet Take 1 tablet (40 mg total) by mouth daily. 30 tablet 1  . polyethylene glycol (MIRALAX / GLYCOLAX) packet Take 17 g by mouth daily.     Marland Kitchen amLODipine (NORVASC) 5 MG tablet Take 5 mg  by mouth daily.   0  . FLUoxetine (PROZAC) 20 MG capsule TAKE 1 CAPSULE BY MOUTH EVERY DAY 90 capsule 3  . FLUoxetine (PROZAC) 20 MG capsule TAKE 1 CAPSULE BY MOUTH EVERY DAY 90 capsule 3  . levothyroxine (SYNTHROID, LEVOTHROID) 112 MCG tablet     . MAGNESIUM-OXIDE 400 (241.3 Mg) MG tablet TK 1 T PO D  2   No facility-administered medications prior to visit.     PAST MEDICAL HISTORY: Past Medical History:  Diagnosis Date  . Acid reflux   . Diabetes mellitus without complication (HCC)    states she was "prediabetic" at one time  . Fibromyalgia   . MS (multiple sclerosis) (HCC)   . Sleep apnea   . Thyroid disease     PAST SURGICAL HISTORY: Past Surgical History:  Procedure Laterality Date  . ENDOMETRIAL BIOPSY    . HERNIA REPAIR      FAMILY HISTORY: Family History  Problem Relation Age of Onset  . Hypertension Mother   . Migraines Mother   . High blood pressure Father   . Lung cancer Father     SOCIAL  HISTORY: Social History   Socioeconomic History  . Marital status: Divorced    Spouse name: Not on file  . Number of children: 1  . Years of education: college  . Highest education level: Not on file  Occupational History    Employer: UNEMPLOYED    Comment: disabled  Social Needs  . Financial resource strain: Not on file  . Food insecurity:    Worry: Not on file    Inability: Not on file  . Transportation needs:    Medical: Not on file    Non-medical: Not on file  Tobacco Use  . Smoking status: Former Smoker    Last attempt to quit: 10/01/1988    Years since quitting: 29.3  . Smokeless tobacco: Never Used  Substance and Sexual Activity  . Alcohol use: No    Alcohol/week: 0.0 oz    Comment: Quit alcohol consumption in 1990's  . Drug use: No  . Sexual activity: Not on file  Lifestyle  . Physical activity:    Days per week: Not on file    Minutes per session: Not on file  . Stress: Not on file  Relationships  . Social connections:    Talks on phone: Not on file    Gets together: Not on file    Attends religious service: Not on file    Active member of club or organization: Not on file    Attends meetings of clubs or organizations: Not on file    Relationship status: Not on file  . Intimate partner violence:    Fear of current or ex partner: Not on file    Emotionally abused: Not on file    Physically abused: Not on file    Forced sexual activity: Not on file  Other Topics Concern  . Not on file  Social History Narrative   She lives with her mother, daughter, she is on disability.    Right handed.   Education college.   Caffeine soda 2 daily      PHYSICAL EXAM  Vitals:   02/11/18 0906  BP: 135/85  Pulse: 93  Weight: (!) 329 lb (149.2 kg)  Height: 5' 4.5" (1.638 m)   Body mass index is 55.6 kg/m.  Generalized: Well developed, in no acute distress   Neurological examination  Mentation: Alert oriented to time, place, history taking.  Follows all  commands speech and language fluent Cranial nerve II-XII: Pupils were equal round reactive to light. Extraocular movements were full, visual field were full on confrontational test. Facial sensation and strength were normal. Uvula tongue midline. Head turning and shoulder shrug  were normal and symmetric. Motor: The motor testing reveals 5 over 5 strength of all 4 extremities. Good symmetric motor tone is noted throughout.  Sensory: Sensory testing is intact to soft touch on all 4 extremities. No evidence of extinction is noted.  Coordination: Cerebellar testing reveals good finger-nose-finger and heel-to-shin bilaterally.  Gait and station: Slightly stooped posture when ambulating.  Gait is slightly wide-based.  Tandem gait is slightly unsteady.  Romberg is negative. Reflexes: Deep tendon reflexes are symmetric and normal bilaterally.   DIAGNOSTIC DATA (LABS, IMAGING, TESTING) - I reviewed patient records, labs, notes, testing and imaging myself where available.  Lab Results  Component Value Date   WBC 5.9 09/08/2015   HGB 11.3 (L) 09/08/2015   HCT 35.5 (L) 09/08/2015   MCV 84.1 09/08/2015   PLT 232 09/08/2015      Component Value Date/Time   NA 140 09/08/2015 1138   NA 142 12/04/2013 0923   K 3.8 09/08/2015 1138   CL 103 09/08/2015 1138   CO2 29 09/08/2015 1138   GLUCOSE 166 (H) 09/08/2015 1138   BUN 14 09/08/2015 1138   BUN 12 12/04/2013 0923   CREATININE 0.92 09/08/2015 1138   CALCIUM 9.0 09/08/2015 1138   PROT 6.4 12/04/2013 0923   ALBUMIN 4.1 12/04/2013 0923   AST 17 12/04/2013 0923   ALT 10 12/04/2013 0923   ALKPHOS 87 12/04/2013 0923   BILITOT 0.3 12/04/2013 0923   GFRNONAA >60 09/08/2015 1138   GFRAA >60 09/08/2015 1138    Lab Results  Component Value Date   HGBA1C 5.5 03/16/2013    Lab Results  Component Value Date   TSH 0.138 (L) 03/16/2013      ASSESSMENT AND PLAN 47 y.o. year old female    [name]  Anxiety  Overall  she is very stable on Gilenya 0.5 mg daily, will continue medications,  Check CBC without contrast, JC virus titer,  She continue complains of significant anxiety taking Prozac 20 mg daily, will increase the dose to 40 mg every day,  Continue follow-up with her primary care physician,      Levert Feinstein, M.D. Ph.D.  [facility] [address] [place], [place] [address] Phone: [phone] Fax:      [phone]

## 2018-02-12 LAB — CBC WITH DIFFERENTIAL
Basophils Absolute: 0 10*3/uL (ref 0.0–0.2)
Basos: 0 %
EOS (ABSOLUTE): 0 10*3/uL (ref 0.0–0.4)
Eos: 0 %
Hematocrit: 35.4 % (ref 34.0–46.6)
Hemoglobin: 11.1 g/dL (ref 11.1–15.9)
IMMATURE GRANULOCYTES: 0 %
Immature Grans (Abs): 0 10*3/uL (ref 0.0–0.1)
Lymphocytes Absolute: 0.5 10*3/uL — ABNORMAL LOW (ref 0.7–3.1)
Lymphs: 9 %
MCH: 25.3 pg — ABNORMAL LOW (ref 26.6–33.0)
MCHC: 31.4 g/dL — ABNORMAL LOW (ref 31.5–35.7)
MCV: 81 fL (ref 79–97)
MONOS ABS: 0.4 10*3/uL (ref 0.1–0.9)
Monocytes: 8 %
Neutrophils Absolute: 4.1 10*3/uL (ref 1.4–7.0)
Neutrophils: 83 %
RBC: 4.39 x10E6/uL (ref 3.77–5.28)
RDW: 15.5 % — AB (ref 12.3–15.4)
WBC: 5 10*3/uL (ref 3.4–10.8)

## 2018-02-17 ENCOUNTER — Telehealth: Payer: Self-pay | Admitting: *Deleted

## 2018-02-17 NOTE — Telephone Encounter (Signed)
Lab collected 02/11/18:  JCV 1.89 H - Positive

## 2018-02-20 NOTE — Telephone Encounter (Signed)
Spoke to patient - she is aware of this result.

## 2018-03-06 ENCOUNTER — Other Ambulatory Visit: Payer: Self-pay | Admitting: *Deleted

## 2018-03-06 MED ORDER — FINGOLIMOD HCL 0.5 MG PO CAPS: 0.5000 mg | ORAL_CAPSULE | Freq: Every day | ORAL | 3 refills | Status: DC

## 2018-03-20 ENCOUNTER — Telehealth: Payer: Self-pay | Admitting: *Deleted

## 2018-03-20 ENCOUNTER — Other Ambulatory Visit: Payer: Self-pay | Admitting: Neurology

## 2018-03-20 NOTE — Telephone Encounter (Signed)
Received refill request for Xanax from the pharmacy.  Dr. [name] discontinued at her last visit and increased her Prozac dosage.  I have spoken with the patient and she will wean off using the remainder of her home supply.  If she feels she needs to restart the medication, she was instructed to discuss with her PCP, Dr. Louis Meckel, who manages her other controlled substances.  The patient was in agreement with this plan.

## 2018-04-04 ENCOUNTER — Other Ambulatory Visit: Payer: Self-pay | Admitting: Neurology

## 2018-04-14 ENCOUNTER — Other Ambulatory Visit: Payer: Self-pay | Admitting: Neurology

## 2018-04-27 ENCOUNTER — Other Ambulatory Visit: Payer: Self-pay | Admitting: Neurology

## 2018-05-31 ENCOUNTER — Other Ambulatory Visit: Payer: Self-pay | Admitting: Neurology

## 2018-06-16 ENCOUNTER — Other Ambulatory Visit: Payer: Self-pay | Admitting: Neurology

## 2018-07-12 ENCOUNTER — Other Ambulatory Visit: Payer: Self-pay | Admitting: Neurology

## 2018-07-22 ENCOUNTER — Telehealth: Payer: Self-pay | Admitting: *Deleted

## 2018-07-22 NOTE — Telephone Encounter (Signed)
Form completed and returned to Candi Leash in medical records.

## 2018-07-22 NOTE — Telephone Encounter (Signed)
Pt form faxed to [facility] @ [phone]

## 2018-07-22 NOTE — Telephone Encounter (Signed)
Pt [place] form on Michelle desk. °

## 2018-07-23 DIAGNOSIS — Z0289 Encounter for other administrative examinations: Secondary | ICD-10-CM

## 2018-08-21 ENCOUNTER — Other Ambulatory Visit: Payer: Self-pay | Admitting: Neurology

## 2018-10-02 ENCOUNTER — Telehealth: Payer: Self-pay | Admitting: *Deleted

## 2018-10-02 NOTE — Telephone Encounter (Signed)
PA Case: [id number], Status: Approved, Coverage Starts on: 09/30/2018 12:00:00 AM, Coverage Ends on: 10/01/2019 12:00:00 AM.

## 2018-10-02 NOTE — Telephone Encounter (Signed)
Gilenya PA started via covermymeds (key: [id number]).  Decision pending.  Pt has coverage through [facility] [phone]).  EO#[id number].

## 2018-10-06 ENCOUNTER — Other Ambulatory Visit: Payer: Self-pay | Admitting: Neurology

## 2018-11-18 ENCOUNTER — Ambulatory Visit: Payer: Medicare PPO | Admitting: Adult Health

## 2018-12-24 ENCOUNTER — Other Ambulatory Visit: Payer: Self-pay | Admitting: Neurology

## 2019-01-12 ENCOUNTER — Telehealth: Payer: Self-pay | Admitting: Neurology

## 2019-01-12 NOTE — Telephone Encounter (Signed)
This is a Dr. [name] patient who is on the schedule to follow-up with [facility] NP, who will still be on maternity leave at the time of patient's appointment. This patient could be called to be offered a virtual visit with Maralyn Sago.

## 2019-01-14 ENCOUNTER — Other Ambulatory Visit: Payer: Self-pay

## 2019-01-14 ENCOUNTER — Ambulatory Visit (INDEPENDENT_AMBULATORY_CARE_PROVIDER_SITE_OTHER): Payer: Medicare PPO | Admitting: Neurology

## 2019-01-14 ENCOUNTER — Encounter: Payer: Self-pay | Admitting: Neurology

## 2019-01-14 DIAGNOSIS — G35 Multiple sclerosis: Secondary | ICD-10-CM

## 2019-01-14 MED ORDER — FINGOLIMOD HCL 0.5 MG PO CAPS: 0.5000 mg | ORAL_CAPSULE | Freq: Every day | ORAL | 3 refills | Status: DC

## 2019-01-14 NOTE — Telephone Encounter (Signed)
Spoke with the patient and has given a verbal consent to file her insurance and to doing a webex visit with Margie Ege, NP 01/14/2019 at 2:45 pm. email has been confirmed by the patient.

## 2019-01-14 NOTE — Progress Notes (Addendum)
Virtual Visit via Video Note  I connected with [name] on 01/14/19 at  2:45 PM EDT by a video enabled telemedicine application and verified that I am speaking with the correct person using two identifiers.   I discussed the limitations of evaluation and management by telemedicine and the availability of in person appointments. The patient expressed understanding and agreed to proceed.  History of Present Illness:  [name] is a 48 year-old right-handed [place], she was diagnosed with multiple sclerosis 10/2004 with positive MRI of the brain and 6 oligoclonal bands in CSF. She was patient of Dr. Sandria ManlyLove. She was begun on Avonex. 03/12/2006 she had new cervical medullary lesion and was changed to rebif. She is in the EPOC study with Gilenya and began the medication 05/24/10. She is not having any side effects from the medication. She had a normal examination at the [facility] checking for macular edema on 08/17/10.   06/25/11 MRI of the brain and cervical spine showed a few scattered supratentorial nonspecific foci of T2 hyperintensity without enhancement and no change versus 04/19/09. There was disc bulging at C5-6 and C6-7 without cord lesions present and no change versus 06/16/08.   She also has hypothyroidism, diabetes, obstructive sleep apnea She is using CPAP At 13 cm of water with ESS 3. She had pulmonary function tests because of decreased breath sounds which were normal. 07/08/2012 she awoke with nausea and shaking of her right hand and arm, and inability to speak. Her right leg "did not want to go". The following day her speech was hesitant but well-formed. She was treated by iv followed by po steroid.   UPDATE June 5th 2014:  Since her last visit in May 20 third, she had multiple phone call to our office, with constellation of complains, including increased bilateral lower extremity tremor, gait difficulty,  continued language difficulty, she is taking baclofen 20 mg 3 times a day, Xanax 0. 2 5 mg twice a day per instruction of on-call physician.   UPDATE July 7th 2014 She presented with progressive weakness of left lower extremity and difficulty with walking as well as progression of tremor affecting right extremities more than left extremities and also affecting her speech. She was admitted for a course of Solu-Medrol intravenously, 500 mg every 12 hours x6 from June 16th to June 18th, which has been very helpful. She wants home physical therapy MRI of brain showed few periventricular and subcortical T2 hyperintensities. Some of these are hypointense on T1 views. No abnormal lesions are seen on post contrast views   She was seen by Eber Jonesarolyn in March 3rd, reported that In February 28 she awakened with a numbness in her right leg and right arm and weakness having trouble with her ambulation. She is having problems with her speech. She received five days of iv solumedrol, which did help her symptoms moderately, but she complains of upset stomach, loss of appetite, elevated glucose, mother complains of labile emotion  UPDATE Sep 13 2015: She presented with increased right side weakness in early Dec 2016, she was admitted to hospital in September 08 2015, there was no evidence of urinary tract infection, she was given a dose of Solu-Medrol, which did help her symptoms some  I have reviewed MRI brain: No acute infarct or evidence of active demyelination.Multiple sclerosis with single new right frontal plaque comparedto brain MRI 03/04/2014.  MRI cervical Moderately motion degraded examination. No definite spinal cord lesion  identified. Mild disc and facet degeneration   I reviewed laboratory evaluation September 08 2015 showed normal CBC, Hg 11.3  Update November 26 2016: She is doing well there was no recurrent multiple sclerosis flareup, we have personally reviewed MRI of the brain and cervical  spine in November 2017, mild multilevel cervical degenerative changes, no canal stenosis, no cervical spinal cord signal abnormality.  MRI of the brain showed scattered T2/FLAIR hyperintensity signal at juxtacortical, periventricular deep white matters, no contrast enhancement no change compared to previous scan in December 2016. She complains of gait abnormality, low back pain, right hip pain, is taking chronic narcotics pain medications.  fatigue, taking provigil 200 mg daily   Reviewed laboratory evaluation on October 08 2016, CBC showed no significant abnormality. Normal CMP  MCH - 25 L MCHC - 30.2 L MPV - 10.6 H NE% - 81.4 H LY% - 6.6 L   UPDATE 02/11/18: She has been overall doing very well, there was no recurrent spells of MS exacerbation, tolerating Gilenya, is seen by her primary care physician on monthly basis, she continue have spells of anxiety, taking prozac 20 mg every day, she takes Xanax 0.25 mg twice a day as needed for anxiety and for insomnia,  January 14, 2019 SS: She is currently taking Gilenya 0.5 mg daily.  She reports for the last 3 weeks has felt like she has been having MS relapse, having trouble with speech, fatigue, numbness/tingling to bilateral legs from mid thigh to mid calf.  She denies any any falls.  She denies any problems with bowels or bladder or trouble with vision/blurry vision.  She reports with her past exacerbations her speech has been affected, she will appear as if she has a [place] accent.  She reports her last exacerbation was maybe a year ago.  She denies any signs of infection, denies any urinary problems or cold or cough symptoms.    Observations/Objective: Alert, speech is somewhat slurred, sounds as if she had a [place] accent, symmetric facial movement, symmetric eye movement, no arm drift, able to perform finger-to-nose outstretched, gait is intact  Assessment and Plan: 1.  Multiple sclerosis  She feels as if she is having an MS  exacerbation, symptoms of altered speech, some numbness/tingling to both legs at mid thigh to mid calf, fatigue.  This is been going on for 3 weeks.  I discussed with Dr. [name], reviewed past MRI.  We will hold off on repeat MRI at this time.  She should continue taking her Gilenya daily, I will recheck basic blood work today.  She should closely monitor her symptoms.  I advised her that if her symptoms worsen or she develops any new symptoms she should let us know.  Follow Up Instructions: 4-6 months for revisit    I discussed the assessment and treatment plan with the patient. The patient was provided an opportunity to ask questions and all were answered. The patient agreed with the plan and demonstrated an understanding of the instructions.   The patient was advised to call back or seek an in-person evaluation if the symptoms worsen or if the condition fails to improve as anticipated.  I provided 25 minutes of non-face-to-face time during this encounter.   Otila Kluver, DNP  [facility] [address], [address] [place], [place] [address] [phone]  Addendum: discussed case with Maralyn Sago, multiple similar complains in the past, reviewed MRI brain in 2018, single right frontal subcortical lesion, stable. On Gilenya, JC virus titer  was 1.89. repeat CBC with diff, may consider decrease gilenya to every other day for concern of lymphopenia and coronavirus pandemic.  Addendum 01/15/2019 SS: I talked with patient and presented her the option of taking Gilenya every other day with concern for coronavirus pandemic. She will follow this suggestion, taking every other day, while coronavirus pandemic is a concern. She will continue to follow CDC guidelines for social distance, handwashing.

## 2019-01-15 NOTE — Progress Notes (Signed)
I have reviewed and agreed above plan. 

## 2019-01-27 ENCOUNTER — Ambulatory Visit: Payer: Medicare PPO | Admitting: Adult Health

## 2019-01-28 ENCOUNTER — Telehealth: Payer: Self-pay | Admitting: Neurology

## 2019-01-28 NOTE — Telephone Encounter (Signed)
[name] wants patient to have labs. Patient has to go to her PCP on Friday 01/30/2019  Paruchuri, Janace Hoard, MD . Patient was wondering if there is anyway if the labs can be faxed to her PCP and they draw ? [phone]

## 2019-02-12 ENCOUNTER — Telehealth: Payer: Self-pay | Admitting: *Deleted

## 2019-02-12 NOTE — Telephone Encounter (Signed)
LMVM for pt to return call to set up ov with ss/NP in 06/2019.  Ok to schedule if she calls back.

## 2019-02-12 NOTE — Telephone Encounter (Signed)
Pt has r/s for September

## 2019-03-28 ENCOUNTER — Telehealth: Payer: Self-pay | Admitting: Neurology

## 2019-03-28 DIAGNOSIS — G35 Multiple sclerosis: Secondary | ICD-10-CM

## 2019-03-28 DIAGNOSIS — L408 Other psoriasis: Secondary | ICD-10-CM

## 2019-03-30 MED ORDER — GILENYA 0.5 MG PO CAPS: 0.5000 mg | ORAL_CAPSULE | Freq: Every day | ORAL | 3 refills | Status: DC

## 2019-03-30 NOTE — Addendum Note (Signed)
Addended by: Hope Pigeon on: 03/30/2019 04:49 PM   Modules accepted: Orders

## 2019-04-01 MED ORDER — GILENYA 0.5 MG PO CAPS: 0.5000 mg | ORAL_CAPSULE | Freq: Every day | ORAL | 3 refills | Status: DC

## 2019-04-01 NOTE — Telephone Encounter (Signed)
Pharmacy is requested meds to be resent .

## 2019-04-01 NOTE — Addendum Note (Signed)
Addended by: Noberto Retort C on: 04/01/2019 01:11 PM   Modules accepted: Orders

## 2019-04-01 NOTE — Telephone Encounter (Signed)
Gilenya refill has been resent to the pharmacy below.

## 2019-04-22 ENCOUNTER — Other Ambulatory Visit: Payer: Self-pay | Admitting: Neurology

## 2019-04-27 ENCOUNTER — Other Ambulatory Visit: Payer: Self-pay | Admitting: Neurology

## 2019-06-14 NOTE — Progress Notes (Signed)
PATIENT: [name] DOB: March 23, 1971  REASON FOR VISIT: follow up HISTORY FROM: patient  HISTORY OF PRESENT ILLNESS: Today 06/15/19  HISTORY  [name] is a 48 year-old right-handed [place] American single female from [place], [place], she was diagnosed with multiple sclerosis 10/2004 with positive MRI of the brain and 6 oligoclonal bands in CSF. She was patient of Dr. Sandria Manly. She was begun on Avonex. 03/12/2006 she had new cervical medullary lesion and was changed to rebif. She is in the EPOC study with Gilenya and began the medication 05/24/10. She is not having any side effects from the medication. She had a normal examination at the [facility] specialists clinic checking for macular edema on 08/17/10.   06/25/11 MRI of the brain and cervical spine showed a few scattered supratentorial nonspecific foci of T2 hyperintensity without enhancement and no change versus 04/19/09. There was disc bulging at C5-6 and C6-7 without cord lesions present and no change versus 06/16/08.   She also has hypothyroidism, diabetes, obstructive sleep apnea She is using CPAP At 13 cm of water with ESS 3. She had pulmonary function tests because of decreased breath sounds which were normal. 07/08/2012 she awoke with nausea and shaking of her right hand and arm, and inability to speak. Her right leg "did not want to go". The following day her speech was hesitant but well-formed. She was treated by iv followed by po steroid.   UPDATE June 5th 2014:  Since her last visit in May 20 third, she had multiple phone call to our office, with constellation of complains, including increased bilateral lower extremity tremor, gait difficulty, continued language difficulty, she is taking baclofen 20 mg 3 times a day, Xanax 0. 2 5 mg twice a day per instruction of on-call physician.   UPDATE July 7th 2014 She presented with progressive weakness of left lower extremity and difficulty with walking as well  as progression of tremor affecting right extremities more than left extremities and also affecting her speech. She was admitted for a course of Solu-Medrol intravenously, 500 mg every 12 hours x6 from June 16th to June 18th, which has been very helpful. She wants home physical therapy MRI of brain showed few periventricular and subcortical T2 hyperintensities. Some of these are hypointense on T1 views. No abnormal lesions are seen on post contrast views   She was seen by [name] in March 3rd, reported that In February 28 she awakened with a numbness in her right leg and right arm and weakness having trouble with her ambulation. She is having problems with her speech. She received five days of iv solumedrol, which did help her symptoms moderately, but she complains of upset stomach, loss of appetite, elevated glucose, mother complains of labile emotion  UPDATE Sep 13 2015: She presented with increased right side weakness in early Dec 2016, she was admitted to hospital in September 08 2015, there was no evidence of urinary tract infection, she was given a dose of Solu-Medrol, which did help her symptoms some  I have reviewed MRI brain: No acute infarct or evidence of active demyelination.Multiple sclerosis with single new right frontal plaque comparedto brain MRI 03/04/2014.  MRI cervical Moderately motion degraded examination. No definite spinal cord lesion identified. Mild disc and facet degeneration   I reviewed laboratory evaluation September 08 2015 showed normal CBC, Hg 11.3  Update November 26 2016: She is doing well there was no recurrent multiple sclerosis flareup, we have personally reviewed MRI of the brain  and cervical spine in November 2017, mild multilevel cervical degenerative changes, no canal stenosis, no cervical spinal cord signal abnormality.  MRI of the brain showed scattered T2/FLAIR hyperintensity signal at juxtacortical, periventricular deep white matters, no contrast  enhancement no change compared to previous scan in December 2016. She complains of gait abnormality, low back pain, right hip pain, is taking chronic narcotics pain medications.  fatigue, taking provigil 200 mg daily   Reviewed laboratory evaluation on October 08 2016, CBC showed no significant abnormality. Normal CMP  MCH - 25 L MCHC - 30.2 L MPV - 10.6 H NE% - 81.4 H LY% - 6.6 L  UPDATE 02/11/18: She has been overall doing very well, there was no recurrent spells of MS exacerbation, tolerating Gilenya, is seen by her primary care physician on monthly basis, she continue have spells of anxiety, taking prozac 20 mg every day, she takes Xanax 0.25 mg twice a day as needed for anxiety and for insomnia,  January 14, 2019 SS: She is currently taking Gilenya 0.5 mg daily.  She reports for the last 3 weeks has felt like she has been having MS relapse, having trouble with speech, fatigue, numbness/tingling to bilateral legs from mid thigh to mid calf.  She denies any any falls.  She denies any problems with bowels or bladder or trouble with vision/blurry vision.  She reports with her past exacerbations her speech has been affected, she will appear as if she has a [place] accent.  She reports her last exacerbation was maybe a year ago.  She denies any signs of infection, denies any urinary problems or cold or cough symptoms.  Update June 15, 2019 SS: She is taking Gilenya daily. She feels her anxiety is getting worse. She feels her speech goes in and out, developing a [place] accent. No changes with bowel/bladder. No changes with ambulation, but has bad right knee. A few months ago she fall in the yard, tripped in a hole. She lives with family. Does not work. She sees her primary once a month, for pain management. We are prescribing Baclofen for tremors in her hands, upper back and shoulder; Provigil for fatigue. She had eye exam, got normal report while taking Gilenya. She is on Cymbalta and  Prozac.   REVIEW OF SYSTEMS: Out of a complete 14 system review of symptoms, the patient complains only of the following symptoms, and all other reviewed systems are negative.  Numbness, speech difficulty, weakness, tremors  ALLERGIES: No Known Allergies  HOME MEDICATIONS: Outpatient Medications Prior to Visit  Medication Sig Dispense Refill  . amLODipine (NORVASC) 10 MG tablet Take 1 tablet by mouth daily.  2  . baclofen (LIORESAL) 20 MG tablet TAKE 1 TABLET BY MOUTH THREE TIMES DAILY 90 tablet 11  . cetirizine (ZYRTEC) 10 MG tablet Take 10 mg by mouth daily.    . Cholecalciferol (VITAMIN D) 2000 units CAPS Take 1 capsule by mouth daily.    Marland Kitchen docusate sodium (COLACE) 100 MG capsule Take 100 mg by mouth 2 (two) times daily.     . DULoxetine (CYMBALTA) 60 MG capsule Take 60 mg by mouth daily.    . ferrous sulfate 325 (65 FE) MG tablet Take 325 mg by mouth daily with breakfast.    . Fingolimod HCl (GILENYA) 0.5 MG CAPS Take 1 capsule (0.5 mg total) by mouth daily. 90 capsule 3  . FLUoxetine (PROZAC) 40 MG capsule TAKE 1 CAPSULE(40 MG) BY MOUTH DAILY 90 capsule 4  . fluticasone (FLONASE) 50  MCG/ACT nasal spray Place 2 sprays into the nose daily.    . hydrochlorothiazide (HYDRODIURIL) 12.5 MG tablet TK 1 T PO D  2  . ketoconazole (NIZORAL) 2 % cream     . levothyroxine (SYNTHROID, LEVOTHROID) 125 MCG tablet Take 125 mcg by mouth daily before breakfast.    . LYRICA 200 MG capsule TK ONE C PO HS  2  . meloxicam (MOBIC) 15 MG tablet Take 15 mg by mouth daily as needed for pain.    . modafinil (PROVIGIL) 200 MG tablet Take 1 tablet (200 mg total) by mouth daily. 90 tablet 1  . morphine (MS CONTIN) 60 MG 12 hr tablet Take 60 mg by mouth every 12 (twelve) hours.    . Multiple Vitamins-Minerals (MULTIVITAMIN PO) Take 1 tablet by mouth daily.     Marland Kitchen. omeprazole (PRILOSEC) 40 MG capsule TK 1 C PO D    . oxyCODONE-acetaminophen (PERCOCET) 7.5-325 MG per tablet Take 1 tablet by mouth 2 (two) times  daily as needed for moderate pain.     . pantoprazole (PROTONIX) 40 MG tablet Take 1 tablet (40 mg total) by mouth daily. 30 tablet 1  . polyethylene glycol (MIRALAX / GLYCOLAX) packet Take 17 g by mouth daily.     . EMBEDA 60-2.4 MG CPCR 2 (two) times daily.  0   No facility-administered medications prior to visit.     PAST MEDICAL HISTORY: Past Medical History:  Diagnosis Date  . Acid reflux   . Diabetes mellitus without complication (HCC)    states she was "prediabetic" at one time  . Fibromyalgia   . MS (multiple sclerosis) (HCC)   . Sleep apnea   . Thyroid disease     PAST SURGICAL HISTORY: Past Surgical History:  Procedure Laterality Date  . ENDOMETRIAL BIOPSY    . HERNIA REPAIR      FAMILY HISTORY: Family History  Problem Relation Age of Onset  . Hypertension Mother   . Migraines Mother   . High blood pressure Father   . Lung cancer Father     SOCIAL HISTORY: Social History   Socioeconomic History  . Marital status: Divorced    Spouse name: Not on file  . Number of children: 1  . Years of education: college  . Highest education level: Not on file  Occupational History    Employer: UNEMPLOYED    Comment: disabled  Social Needs  . Financial resource strain: Not on file  . Food insecurity    Worry: Not on file    Inability: Not on file  . Transportation needs    Medical: Not on file    Non-medical: Not on file  Tobacco Use  . Smoking status: Former Smoker    Quit date: 10/01/1988    Years since quitting: 30.7  . Smokeless tobacco: Never Used  Substance and Sexual Activity  . Alcohol use: No    Alcohol/week: 0.0 standard drinks    Comment: Quit alcohol consumption in 1990's  . Drug use: No  . Sexual activity: Not on file  Lifestyle  . Physical activity    Days per week: Not on file    Minutes per session: Not on file  . Stress: Not on file  Relationships  . Social Musicianconnections    Talks on phone: Not on file    Gets together: Not on file     Attends religious service: Not on file    Active member of club or organization: Not on file  Attends meetings of clubs or organizations: Not on file    Relationship status: Not on file  . Intimate partner violence    Fear of current or ex partner: Not on file    Emotionally abused: Not on file    Physically abused: Not on file    Forced sexual activity: Not on file  Other Topics Concern  . Not on file  Social History Narrative   She lives with her mother, daughter, she is on disability.    Right handed.   Education college.   Caffeine soda 2 daily      PHYSICAL EXAM  Vitals:   06/15/19 0921  BP: (!) 147/94  Pulse: 86  Temp: 98.2 F (36.8 C)  Weight: (!) 310 lb 9.6 oz (140.9 kg)  Height: 5\' 4"  (1.626 m)   Body mass index is 53.31 kg/m.  Generalized: Well developed, in no acute distress   Neurological examination  Mentation: Alert oriented to time, place, history taking. Follows all commands speech and language fluent, normal speech initially, during visit developed accent, sounds [place] Cranial nerve II-XII: Pupils were equal round reactive to light. Extraocular movements were full, visual field were full on confrontational test. Facial sensation and strength were normal.  Head turning and shoulder shrug  were normal and symmetric. Motor: The motor testing reveals 5 over 5 strength of all 4 extremities. Good symmetric motor tone is noted throughout.  Sensory: Sensory testing is intact to soft touch on all 4 extremities. No evidence of extinction is noted.  Coordination: Cerebellar testing reveals good finger-nose-finger and heel-to-shin bilaterally.  Gait and station: Gait is forward leaning, using a cane, good pace.  Tandem gait is normal. Romberg is negative. No drift is seen.  Reflexes: Deep tendon reflexes are symmetric and normal bilaterally.   DIAGNOSTIC DATA (LABS, IMAGING, TESTING) - I reviewed patient records, labs, notes, testing and imaging myself where  available.  Lab Results  Component Value Date   WBC 5.0 02/11/2018   HGB 11.1 02/11/2018   HCT 35.4 02/11/2018   MCV 81 02/11/2018   PLT 232 09/08/2015      Component Value Date/Time   NA 140 09/08/2015 1138   NA 142 12/04/2013 0923   K 3.8 09/08/2015 1138   CL 103 09/08/2015 1138   CO2 29 09/08/2015 1138   GLUCOSE 166 (H) 09/08/2015 1138   BUN 14 09/08/2015 1138   BUN 12 12/04/2013 0923   CREATININE 0.92 09/08/2015 1138   CALCIUM 9.0 09/08/2015 1138   PROT 6.4 12/04/2013 0923   ALBUMIN 4.1 12/04/2013 0923   AST 17 12/04/2013 0923   ALT 10 12/04/2013 0923   ALKPHOS 87 12/04/2013 0923   BILITOT 0.3 12/04/2013 0923   GFRNONAA >60 09/08/2015 1138   GFRAA >60 09/08/2015 1138   No results found for: CHOL, HDL, LDLCALC, LDLDIRECT, TRIG, CHOLHDL Lab Results  Component Value Date   HGBA1C 5.5 03/16/2013   No results found for: VITAMINB12 Lab Results  Component Value Date   TSH 0.138 (L) 03/16/2013    ASSESSMENT AND PLAN 48 y.o. year old female  has a past medical history of Acid reflux, Diabetes mellitus without complication (HCC), Fibromyalgia, MS (multiple sclerosis) (HCC), Sleep apnea, and Thyroid disease. here with:  1.  [name] -She remains on Gilenya daily, tolerating medication well (eye exam recent, she reports was normal) -I will check routine lab work today to include a CBC with differential, CMP -I will reorder MRI of the brain and  cervical spine, last completed in 2018 -She continues to report alterations in her speech periodically, where she develops a [place] accent (noted today) -JCV 1.89-positive 02/11/2018 -She remains on baclofen, Provigil (will refill today) -She will follow-up in 6 months or sooner if needed  2.  Anxiety -She will continue Prozac 40 mg daily, Cymbalta (prescribed by her primary) -She feels her anxiety is worsening, she thinks she may need Xanax, as she was on in the past -She will discuss this with  her primary doctor, who is prescribing pain management, may need referral for psychiatrist in future  I spent 25 minutes with the patient. 50% of this time was spent discussing her plan of care.  Margie EgeSarah Shamika Pedregon, AGNP-C, DNP 06/15/2019, 9:40 AM [facility] [address], [address] [place], [place] [id number] [phone]

## 2019-06-15 ENCOUNTER — Encounter: Payer: Self-pay | Admitting: Neurology

## 2019-06-15 ENCOUNTER — Ambulatory Visit (INDEPENDENT_AMBULATORY_CARE_PROVIDER_SITE_OTHER): Payer: Medicare PPO | Admitting: Neurology

## 2019-06-15 ENCOUNTER — Other Ambulatory Visit: Payer: Self-pay

## 2019-06-15 VITALS — BP 147/94 | HR 86 | Temp 98.2°F | Ht 64.0 in | Wt 310.6 lb

## 2019-06-15 DIAGNOSIS — G35 Multiple sclerosis: Secondary | ICD-10-CM | POA: Diagnosis not present

## 2019-06-15 DIAGNOSIS — F419 Anxiety disorder, unspecified: Secondary | ICD-10-CM | POA: Diagnosis not present

## 2019-06-15 MED ORDER — MODAFINIL 200 MG PO TABS: 200.0000 mg | ORAL_TABLET | Freq: Every day | ORAL | 1 refills | Status: DC

## 2019-06-15 NOTE — Patient Instructions (Signed)
Continue current medications. Discuss Xanax with your primary doctor. I will order MRI brain and cervical spine today.

## 2019-06-15 NOTE — Progress Notes (Signed)
I have reviewed and agreed above plan. 

## 2019-06-16 ENCOUNTER — Telehealth: Payer: Self-pay | Admitting: *Deleted

## 2019-06-16 ENCOUNTER — Telehealth: Payer: Self-pay | Admitting: Neurology

## 2019-06-16 LAB — CBC WITH DIFFERENTIAL/PLATELET
Basophils Absolute: 0 10*3/uL (ref 0.0–0.2)
Basos: 0 %
EOS (ABSOLUTE): 0.1 10*3/uL (ref 0.0–0.4)
Eos: 1 %
Hematocrit: 38.5 % (ref 34.0–46.6)
Hemoglobin: 12 g/dL (ref 11.1–15.9)
Immature Grans (Abs): 0 10*3/uL (ref 0.0–0.1)
Immature Granulocytes: 1 %
Lymphocytes Absolute: 0.4 10*3/uL — ABNORMAL LOW (ref 0.7–3.1)
Lymphs: 7 %
MCH: 25.5 pg — ABNORMAL LOW (ref 26.6–33.0)
MCHC: 31.2 g/dL — ABNORMAL LOW (ref 31.5–35.7)
MCV: 82 fL (ref 79–97)
Monocytes Absolute: 0.5 10*3/uL (ref 0.1–0.9)
Monocytes: 10 %
Neutrophils Absolute: 4.6 10*3/uL (ref 1.4–7.0)
Neutrophils: 81 %
Platelets: 308 10*3/uL (ref 150–450)
RBC: 4.71 x10E6/uL (ref 3.77–5.28)
RDW: 14.5 % (ref 11.7–15.4)
WBC: 5.7 10*3/uL (ref 3.4–10.8)

## 2019-06-16 LAB — COMPREHENSIVE METABOLIC PANEL
ALT: 12 IU/L (ref 0–32)
AST: 24 IU/L (ref 0–40)
Albumin/Globulin Ratio: 1.7 (ref 1.2–2.2)
Albumin: 4.3 g/dL (ref 3.8–4.8)
Alkaline Phosphatase: 111 IU/L (ref 39–117)
BUN/Creatinine Ratio: 12 (ref 9–23)
BUN: 11 mg/dL (ref 6–24)
Bilirubin Total: 0.5 mg/dL (ref 0.0–1.2)
CO2: 26 mmol/L (ref 20–29)
Calcium: 9 mg/dL (ref 8.7–10.2)
Chloride: 101 mmol/L (ref 96–106)
Creatinine, Ser: 0.92 mg/dL (ref 0.57–1.00)
GFR calc Af Amer: 86 mL/min/{1.73_m2} (ref >59)
GFR calc non Af Amer: 74 mL/min/{1.73_m2} (ref >59)
Globulin, Total: 2.6 g/dL (ref 1.5–4.5)
Glucose: 98 mg/dL (ref 65–99)
Potassium: 4.1 mmol/L (ref 3.5–5.2)
Sodium: 141 mmol/L (ref 134–144)
Total Protein: 6.9 g/dL (ref 6.0–8.5)

## 2019-06-16 NOTE — Telephone Encounter (Signed)
Mcarthur Rossetti Josem Kaufmann: [id number] (exp. 06/16/19 to 07/16/19) order sent to GI. They will reach out to the patient to schedule.

## 2019-06-16 NOTE — Telephone Encounter (Signed)
Relayed to pt that her labs stable.  absol lymph count 0.4 (okay while on gilenya).  Pt verbalized understanding.

## 2019-06-16 NOTE — Telephone Encounter (Signed)
-----   Message from Suzzanne Cloud, NP sent at 06/16/2019  8:04 AM EDT ----- Please call the patient. Labs are stable, absolute lymphocyte count is 0.4, okay while on Gilenya.

## 2019-06-17 ENCOUNTER — Telehealth: Payer: Self-pay | Admitting: Neurology

## 2019-06-17 NOTE — Telephone Encounter (Signed)
Noted agree with plan

## 2019-06-17 NOTE — Telephone Encounter (Signed)
Pt called wanting to inform the provider that at her last appt when she got her labs done she feels like a nerve was hit or something because she has been feeling a pain through that arm since she was stuck with the needle. Pt would like to speak to the RN. Please advise.

## 2019-06-17 NOTE — Telephone Encounter (Addendum)
I called pt and she has R finger numbness (slight swelling in thumb up to middle finger) her dominant hand. This happened when had her labs drawn, R AC, had electric shock pain into R hand 2 days ago.  Continues with sharp shooting/ tingling pain. I relayed to apply warm compresses to [facility] site. She takes meloxicam 15mg  po qhs.  I will relay to Butler Denmark, NP.  This will eventually get better, sometimes over 3-4wks.  I relayed to continue warm compresses, elevation prn.  May take some time to improve and heal.  But let us know if persists and not improving.   She verbalized understanding.

## 2019-06-25 ENCOUNTER — Telehealth: Payer: Self-pay | Admitting: *Deleted

## 2019-06-25 NOTE — Telephone Encounter (Signed)
PA ref # [id number] determination pending for 24-72hours.  [phone] for results.  Attempted on CMM and not able to continue as eligibility did not allow.  G35 MS and fatigue R53.83, has cpap for osa.  Modafinil 200mg  po daily.

## 2019-06-29 NOTE — Telephone Encounter (Signed)
Received approval for modafinil 200mg  tablets.  Good until 21-31-20.  ID [id number].  [employer] [phone].

## 2019-07-11 ENCOUNTER — Ambulatory Visit
Admission: RE | Admit: 2019-07-11 | Discharge: 2019-07-11 | Disposition: A | Discharge status: Home / Self Care | Payer: Medicare PPO | Source: Ambulatory Visit | Attending: Neurology | Admitting: Neurology

## 2019-07-11 ENCOUNTER — Other Ambulatory Visit: Payer: Self-pay

## 2019-07-11 DIAGNOSIS — G35 Multiple sclerosis: Secondary | ICD-10-CM

## 2019-07-11 MED ORDER — GADOBENATE DIMEGLUMINE 529 MG/ML IV SOLN
20.0000 mL | Freq: Once | INTRAVENOUS | Status: AC | PRN
  Administered 2019-07-11: 20 mL via INTRAVENOUS

## 2019-07-13 ENCOUNTER — Telehealth: Payer: Self-pay

## 2019-07-13 NOTE — Telephone Encounter (Signed)
Spoke with the patient and she verbalized understanding her MRI results. No the other questions or concerns at this time.

## 2019-07-13 NOTE — Telephone Encounter (Signed)
-----   Message from Suzzanne Cloud, NP sent at 07/13/2019  1:00 PM EDT ----- Please call the patient for MRI of the brain and cervical spine that were unchanged from prior.  MRI of the brain, none of the foci are acute, all were present on previous MRI. MRI of the cervical spine, no change from 2016.  MRI of the brain  IMPRESSION: This MRI of the brain with and without contrast shows the following: 1.    There are T2/flair hyperintense foci in the pons and hemispheres.  Although the foci are nonspecific, they could be consistent with her known diagnosis of multiple sclerosis.  Chronic microvascular ischemic change would be less likely to have this pattern. 2.    Mild left maxillary chronic sinusitis. 3.    There is a normal enhancement pattern and no acute findings.  MRI of cervical spine IMPRESSION: This MRI of the cervical spine with and without contrast shows the following: 1.    The spinal cord appears normal before and after contrast. 2.    There are stable multilevel degenerative changes as detailed above.  There is minimal spinal stenosis at C3-C4, mild spinal stenosis at C4-C5, mild to moderate spinal stenosis at C5-C6 and mild spinal stenosis at C6-C7 due to combination of disc protrusions, other degenerative change and congenitally short pedicles.  There is no nerve root compression.

## 2019-08-11 DIAGNOSIS — Z0289 Encounter for other administrative examinations: Secondary | ICD-10-CM

## 2019-08-19 ENCOUNTER — Telehealth: Payer: Self-pay | Admitting: *Deleted

## 2019-08-19 NOTE — Telephone Encounter (Signed)
Completed [employer] form,  To Dr. Krista Blue for review and signature.  Signed.  To MR for faxing, scanning.

## 2019-08-24 ENCOUNTER — Telehealth: Payer: Self-pay | Admitting: Neurology

## 2019-08-24 NOTE — Telephone Encounter (Signed)
PPW faxed to [facility] 08/24/19.

## 2019-09-15 ENCOUNTER — Telehealth: Payer: Self-pay

## 2019-09-15 NOTE — Telephone Encounter (Signed)
PA done for GIlenya capsules done  via phone [employer] at [phone]. Clinical questions were answered and reviewed. Reference call [id number] for PA.  Authorization approve from 10/01/2019 to 09/30/2020.

## 2019-09-15 NOTE — Telephone Encounter (Signed)
I tried to do PA for [place] 0.5mg  capsules on cover my meds. I called [employer] and they stated pts authorization ends 09/30/2019. I called [phone] to do PA.

## 2019-11-10 ENCOUNTER — Telehealth: Payer: Self-pay | Admitting: *Deleted

## 2019-11-10 NOTE — Telephone Encounter (Signed)
Called [employer] re: modafinil 200 mg PA. Spoke with Diane, answered clinical questions, decision 24-72 hours via fax. Dx: g35, r 53.83  On med since 05/2013. Call Ref # [id number]

## 2019-11-11 NOTE — Telephone Encounter (Signed)
Received an approval for Modafinil 200 mg tablet. Approved through 09/30/2020. A copy of the approval letter has been faxed to the patient's pharmacy. Confirmation fax has been received.

## 2019-11-22 ENCOUNTER — Other Ambulatory Visit: Payer: Self-pay

## 2019-11-22 ENCOUNTER — Encounter (HOSPITAL_BASED_OUTPATIENT_CLINIC_OR_DEPARTMENT_OTHER): Payer: Self-pay | Admitting: *Deleted

## 2019-11-22 ENCOUNTER — Emergency Department (HOSPITAL_BASED_OUTPATIENT_CLINIC_OR_DEPARTMENT_OTHER)
Admission: EM | Admit: 2019-11-22 | Discharge: 2019-11-22 | Disposition: A | Discharge status: Home / Self Care | Payer: Medicare PPO | Attending: Emergency Medicine | Admitting: Emergency Medicine

## 2019-11-22 ENCOUNTER — Emergency Department (HOSPITAL_BASED_OUTPATIENT_CLINIC_OR_DEPARTMENT_OTHER): Payer: Medicare PPO

## 2019-11-22 DIAGNOSIS — Z7984 Long term (current) use of oral hypoglycemic drugs: Secondary | ICD-10-CM | POA: Diagnosis not present

## 2019-11-22 DIAGNOSIS — K029 Dental caries, unspecified: Secondary | ICD-10-CM | POA: Insufficient documentation

## 2019-11-22 DIAGNOSIS — G35 Multiple sclerosis: Secondary | ICD-10-CM | POA: Insufficient documentation

## 2019-11-22 DIAGNOSIS — Z87891 Personal history of nicotine dependence: Secondary | ICD-10-CM | POA: Diagnosis not present

## 2019-11-22 DIAGNOSIS — R519 Headache, unspecified: Secondary | ICD-10-CM | POA: Diagnosis not present

## 2019-11-22 DIAGNOSIS — M797 Fibromyalgia: Secondary | ICD-10-CM | POA: Diagnosis not present

## 2019-11-22 DIAGNOSIS — E119 Type 2 diabetes mellitus without complications: Secondary | ICD-10-CM | POA: Diagnosis not present

## 2019-11-22 DIAGNOSIS — Z79899 Other long term (current) drug therapy: Secondary | ICD-10-CM | POA: Diagnosis not present

## 2019-11-22 DIAGNOSIS — R200 Anesthesia of skin: Secondary | ICD-10-CM | POA: Diagnosis present

## 2019-11-22 LAB — CBC WITH DIFFERENTIAL/PLATELET
Abs Immature Granulocytes: 0.04 10*3/uL (ref 0.00–0.07)
Basophils Absolute: 0 10*3/uL (ref 0.0–0.1)
Basophils Relative: 0 %
Eosinophils Absolute: 0 10*3/uL (ref 0.0–0.5)
Eosinophils Relative: 0 %
HCT: 37.8 % (ref 36.0–46.0)
Hemoglobin: 11.8 g/dL — ABNORMAL LOW (ref 12.0–15.0)
Immature Granulocytes: 1 %
Lymphocytes Relative: 5 %
Lymphs Abs: 0.3 10*3/uL — ABNORMAL LOW (ref 0.7–4.0)
MCH: 25.6 pg — ABNORMAL LOW (ref 26.0–34.0)
MCHC: 31.2 g/dL (ref 30.0–36.0)
MCV: 82 fL (ref 80.0–100.0)
Monocytes Absolute: 0.7 10*3/uL (ref 0.1–1.0)
Monocytes Relative: 11 %
Neutro Abs: 5 10*3/uL (ref 1.7–7.7)
Neutrophils Relative %: 83 %
Platelets: 266 10*3/uL (ref 150–400)
RBC: 4.61 MIL/uL (ref 3.87–5.11)
RDW: 14.2 % (ref 11.5–15.5)
WBC: 6 10*3/uL (ref 4.0–10.5)
nRBC: 0 % (ref 0.0–0.2)

## 2019-11-22 LAB — BASIC METABOLIC PANEL
Anion gap: 10 (ref 5–15)
BUN: 8 mg/dL (ref 6–20)
CO2: 29 mmol/L (ref 22–32)
Calcium: 8.6 mg/dL — ABNORMAL LOW (ref 8.9–10.3)
Chloride: 100 mmol/L (ref 98–111)
Creatinine, Ser: 0.99 mg/dL (ref 0.44–1.00)
GFR calc Af Amer: >60 mL/min (ref >60)
GFR calc non Af Amer: >60 mL/min (ref >60)
Glucose, Bld: 116 mg/dL — ABNORMAL HIGH (ref 70–99)
Potassium: 3.4 mmol/L — ABNORMAL LOW (ref 3.5–5.1)
Sodium: 139 mmol/L (ref 135–145)

## 2019-11-22 MED ORDER — IOHEXOL 300 MG/ML  SOLN
100.0000 mL | Freq: Once | INTRAMUSCULAR | Status: AC | PRN
  Administered 2019-11-22: 20:00:00 75 mL via INTRAVENOUS

## 2019-11-22 MED ORDER — MORPHINE SULFATE (PF) 4 MG/ML IV SOLN
4.0000 mg | Freq: Once | INTRAVENOUS | Status: AC
  Administered 2019-11-22: 4 mg via INTRAVENOUS
  Filled 2019-11-22: qty 1

## 2019-11-22 MED ORDER — CLINDAMYCIN HCL 300 MG PO CAPS: 300.0000 mg | ORAL_CAPSULE | Freq: Three times a day (TID) | ORAL | 0 refills | Status: AC

## 2019-11-22 MED ORDER — SODIUM CHLORIDE 0.9 % IV BOLUS
1000.0000 mL | Freq: Once | INTRAVENOUS | Status: AC
  Administered 2019-11-22: 19:00:00 1000 mL via INTRAVENOUS

## 2019-11-22 NOTE — ED Provider Notes (Signed)
[facility] EMERGENCY DEPARTMENT Provider Note   CSN: [id number] Arrival date & time: 11/22/19  1815     History Chief Complaint  Patient presents with  . Numbness  . Facial Pain    [name] is a 49 y.o. female.  The history is provided by the patient.  Illness Associated symptoms: no abdominal pain, no chest pain, no congestion, no cough, no ear pain, no fever, no headaches, no rash, no rhinorrhea, no shortness of breath, no sore throat and no vomiting        Past Medical History:  Diagnosis Date  . Acid reflux   . Diabetes mellitus without complication (HCC)    states she was "prediabetic" at one time  . Fibromyalgia   . MS (multiple sclerosis) (HCC)   . Sleep apnea   . Thyroid disease     Patient Active Problem List   Diagnosis Date Noted  . Anxiety 02/11/2018  . Chronic low back pain 11/26/2016  . Dysarthria 12/04/2013  . Unstable gait 03/15/2013  . Morbid obesity (HCC) 03/05/2013  . Anxiety state, unspecified 10/23/2012  . Essential and other specified forms of tremor 10/23/2012  . Nontoxic uninodular goiter 10/23/2012  . Other abnormal glucose 10/23/2012  . Meralgia paresthetica 10/23/2012  . Unspecified vitamin D deficiency 10/23/2012  . Unspecified endocrine disorder 10/23/2012  . Thoracic or lumbosacral neuritis or radiculitis, unspecified 10/23/2012  . Abnormality of gait 10/23/2012  . Multiple sclerosis (HCC) 10/23/2012    Past Surgical History:  Procedure Laterality Date  . ENDOMETRIAL BIOPSY    . HERNIA REPAIR       OB History   No obstetric history on file.     Family History  Problem Relation Age of Onset  . Hypertension Mother   . Migraines Mother   . High blood pressure Father   . Lung cancer Father     Social History   Tobacco Use  . Smoking status: Former Smoker    Quit date: 10/01/1988    Years since quitting: 31.1  . Smokeless tobacco: Never Used  Substance Use Topics  . Alcohol use: No   Alcohol/week: 0.0 standard drinks    Comment: Quit alcohol consumption in 1990's  . Drug use: No    Home Medications Prior to Admission medications   Medication Sig Start Date End Date Taking? Authorizing Provider  amLODipine (NORVASC) 10 MG tablet Take 1 tablet by mouth daily. 02/07/18   [provider]  baclofen (LIORESAL) 20 MG tablet TAKE 1 TABLET BY MOUTH THREE TIMES DAILY 04/22/19   Levert Feinstein, MD  cetirizine (ZYRTEC) 10 MG tablet Take 10 mg by mouth daily.    [provider]  Cholecalciferol (VITAMIN D) 2000 units CAPS Take 1 capsule by mouth daily.    [provider]  clindamycin (CLEOCIN) 300 MG capsule Take 1 capsule (300 mg total) by mouth 3 (three) times daily for 10 days. 11/22/19 12/02/19  Jaquay Morneault, DO  docusate sodium (COLACE) 100 MG capsule Take 100 mg by mouth 2 (two) times daily.     [provider]  DULoxetine (CYMBALTA) 60 MG capsule Take 60 mg by mouth daily.    [provider]  ferrous sulfate 325 (65 FE) MG tablet Take 325 mg by mouth daily with breakfast.    [provider]  Fingolimod HCl (GILENYA) 0.5 MG CAPS Take 1 capsule (0.5 mg total) by mouth daily. 04/01/19   Levert Feinstein, MD  FLUoxetine (PROZAC) 40 MG capsule TAKE 1  CAPSULE(40 MG) BY MOUTH DAILY 04/27/19   Levert Feinstein, MD  fluticasone [facility]) 50 MCG/ACT nasal spray Place 2 sprays into the nose daily.    [provider]  hydrochlorothiazide (HYDRODIURIL) 12.5 MG tablet TK 1 T PO D 01/10/17   [provider]  ketoconazole (NIZORAL) 2 % cream  06/03/19   [provider]  levothyroxine (SYNTHROID, LEVOTHROID) 125 MCG tablet Take 125 mcg by mouth daily before breakfast.    [provider]  LYRICA 200 MG capsule TK ONE C PO HS 02/08/17   [provider]  meloxicam (MOBIC) 15 MG tablet Take 15 mg by mouth daily as needed for pain.    [provider]  modafinil (PROVIGIL) 200 MG tablet Take 1 tablet (200 mg total) by  mouth daily. 06/15/19   Glean Salvo, NP  morphine (MS CONTIN) 60 MG 12 hr tablet Take 60 mg by mouth every 12 (twelve) hours.    [provider]  Multiple Vitamins-Minerals (MULTIVITAMIN PO) Take 1 tablet by mouth daily.     [provider]  omeprazole (PRILOSEC) 40 MG capsule TK 1 C PO D 05/27/19   [provider]  oxyCODONE-acetaminophen (PERCOCET) 7.5-325 MG per tablet Take 1 tablet by mouth 2 (two) times daily as needed for moderate pain.     [provider]  pantoprazole (PROTONIX) 40 MG tablet Take 1 tablet (40 mg total) by mouth daily. 08/24/13   Nilda Riggs, NP  polyethylene glycol [facility]the[facility] Hal) packet Take 17 g by mouth daily.  03/18/13   Leona Singleton, MD    Allergies    Patient has no known allergies.  Review of Systems   Review of Systems  Constitutional: Negative for chills and fever.  HENT: Positive for dental problem (left lower dental pain ) and facial swelling (left side of face). Negative for congestion, drooling, ear discharge, ear pain, hearing loss, mouth sores, nosebleeds, postnasal drip, rhinorrhea, sinus pressure, sinus pain, sneezing, sore throat, tinnitus, trouble swallowing and voice change.        Pain to the left side of the face, left lower dental pain  Eyes: Negative for pain and visual disturbance.  Respiratory: Negative for cough and shortness of breath.   Cardiovascular: Negative for chest pain and palpitations.  Gastrointestinal: Negative for abdominal pain and vomiting.  Genitourinary: Negative for dysuria and hematuria.  Musculoskeletal: Negative for arthralgias and back pain.  Skin: Negative for color change and rash.  Neurological: Positive for numbness. Negative for dizziness, tremors, seizures, syncope, facial asymmetry, speech difficulty, weakness, light-headedness and headaches.  All other systems reviewed and are negative.   Physical Exam Updated Vital Signs BP (!) 158/88 (BP  Location: Left Wrist)   Pulse 96   Temp 98.6 F (37 C) (Oral)   Resp 20   Ht 5\' 4"  (1.626 m)   Wt (!) 137 kg   SpO2 100%   BMI 51.84 kg/m   Physical Exam Vitals and nursing note reviewed.  Constitutional:      General: She is not in acute distress.    Appearance: She is well-developed. She is not ill-appearing.  HENT:     Head: Normocephalic and atraumatic.     Comments: Poor dentition throughout especially in the left lower mouth with no obvious abscess, tenderness throughout the left side of the face over the parotid gland area but no obvious major swelling compared to the right, some tenderness at the TMJ, TMs bilaterally without signs of infection  Right Ear: Tympanic membrane normal.     Nose: Nose normal.     Mouth/Throat:     Mouth: Mucous membranes are moist.  Eyes:     Extraocular Movements: Extraocular movements intact.     Conjunctiva/sclera: Conjunctivae normal.     Pupils: Pupils are equal, round, and reactive to light.  Cardiovascular:     Rate and Rhythm: Normal rate and regular rhythm.     Pulses: Normal pulses.     Heart sounds: Normal heart sounds. No murmur.  Pulmonary:     Effort: Pulmonary effort is normal. No respiratory distress.     Breath sounds: Normal breath sounds.  Abdominal:     General: Abdomen is flat.     Palpations: Abdomen is soft.     Tenderness: There is no abdominal tenderness.  Musculoskeletal:     Cervical back: Normal range of motion and neck supple.  Skin:    General: Skin is warm and dry.  Neurological:     General: No focal deficit present.     Mental Status: She is alert and oriented to person, place, and time.     Cranial Nerves: No cranial nerve deficit.     Motor: No weakness.     Coordination: Coordination normal.     Gait: Gait normal.     Comments: Sensation intact throughout except for some mild numbness to the left side of the lower lip  Psychiatric:        Mood and Affect: Mood normal.     ED Results /  Procedures / Treatments   Labs (all labs ordered are listed, but only abnormal results are displayed) Labs Reviewed  CBC WITH DIFFERENTIAL/PLATELET - Abnormal; Notable for the following components:      Result Value   Hemoglobin 11.8 (*)    MCH 25.6 (*)    Lymphs Abs 0.3 (*)    All other components within normal limits  BASIC METABOLIC PANEL - Abnormal; Notable for the following components:   Potassium 3.4 (*)    Glucose, Bld 116 (*)    Calcium 8.6 (*)    All other components within normal limits    EKG None  Radiology CT Head Wo Contrast  Result Date: 11/22/2019 CLINICAL DATA:  Left-sided facial pain since this morning, decreased facial sensation EXAM: CT HEAD WITHOUT CONTRAST TECHNIQUE: Contiguous axial images were obtained from the base of the skull through the vertex without intravenous contrast. COMPARISON:  08/31/2017 FINDINGS: Brain: No acute infarct or hemorrhage. Lateral ventricles and midline structures are unremarkable. No acute extra-axial fluid collections. No mass effect. Vascular: No hyperdense vessel or unexpected calcification. Skull: Normal. Negative for fracture or focal lesion. Sinuses/Orbits: Mucosal thickening is seen within the left maxillary sinus. Remaining sinuses are clear. Other: None IMPRESSION: 1. Left maxillary sinus disease. 2. No acute intracranial process. Electronically Signed   By: Randa Ngo M.D.   On: 11/22/2019 20:16   CT Maxillofacial W Contrast  Result Date: 11/22/2019 CLINICAL DATA:  Left-sided facial pain since this morning, decreased sensation left-sided face, dental pain, history of MS EXAM: CT MAXILLOFACIAL WITH CONTRAST TECHNIQUE: Multidetector CT imaging of the maxillofacial structures was performed with intravenous contrast. Multiplanar CT image reconstructions were also generated. CONTRAST:  8mL OMNIPAQUE IOHEXOL 300 MG/ML  SOLN COMPARISON:  09/19/2017 FINDINGS: Osseous: There are no acute or destructive bony lesions. There is poor  dentition, with dental caries seen within the left inferior bicuspid and premolar as well as the bilateral superior molars. Orbits: Negative.  No traumatic or inflammatory finding. Sinuses: There is mucosal thickening within the alveolar recess of the left maxillary sinus. No gas fluid level. Remaining sinuses are clear. Soft tissues: Negative. Limited intracranial: No significant or unexpected finding. IMPRESSION: 1. Poor dentition with multiple dental caries as above. 2. Left maxillary sinus disease. 3. Otherwise unremarkable exam. Electronically Signed   By: Sharlet Salina M.D.   On: 11/22/2019 20:20    Procedures Procedures (including critical care time)  Medications Ordered in ED Medications  sodium chloride 0.9 % bolus 1,000 mL ( Intravenous Stopped 11/22/19 2024)  morphine 4 MG/ML injection 4 mg (4 mg Intravenous Given 11/22/19 1908)  iohexol (OMNIPAQUE) 300 MG/ML solution 100 mL (75 mLs Intravenous Contrast Given 11/22/19 1959)    ED Course  I have reviewed the triage vital signs and the nursing notes.  Pertinent labs & imaging results that were available during my care of the patient were reviewed by me and considered in my medical decision making (see chart for details).    MDM Rules/Calculators/A&P                      Julieth Tugman George is a 49 year old female with history of MS, fibromyalgia to the ED with left-sided facial pain, numbness.  Patient with normal vitals.  No fever.  Ongoing pain since she woke up this morning.  Has some numbness to the left lower lip, left lower dental pain, left-sided facial pain.  Patient denies any other neurological symptoms.  She is on chronic narcotic pain medicine.  She appears neurologically intact on exam except for possibly some numbness to the left lower lip.  She does appear to have dental caries.  This could be referred dental pain.  Could be parotiditis as she does have tenderness over the parotid gland.  Could be TMJ as she is tender  in that area as well.  Less likely that this is stroke.  TMs are clear bilaterally.  She states that when she does not feel well she starts to have some tremors per her MS symptoms.  Having some mild tremors.  We will get a CT scan of her head and face to evaluate for source for her symptoms today.  Will get basic labs.  Will give her normal saline bolus, IV morphine.  Patient with normal vitals.  No fever.  No significant anemia, electrolyte abnormality, kidney injury.  CT scan of the head unremarkable.  Patient with multiple dental caries especially in the left inferior bicuspid and premolar area.  Suspect that this is causing referred pain numbness and tingling.  Will start antibiotics with clindamycin and give her referral to dentistry.  This chart was dictated using voice recognition software.  Despite best efforts to proofread,  errors can occur which can change the documentation meaning.    Final Clinical Impression(s) / ED Diagnoses Final diagnoses:  Dental caries    Rx / DC Orders ED Discharge Orders         Ordered    clindamycin (CLEOCIN) 300 MG capsule  3 times daily     11/22/19 2049           Virgina Norfolk, DO 11/22/19 2050

## 2019-11-22 NOTE — ED Notes (Signed)
ED Provider at bedside. 

## 2019-11-22 NOTE — ED Triage Notes (Addendum)
Pt reports left side facial pain since this am. States left cheek and left side of lip have decreased sensation. C/o dental pain. Sx started upon waking. She reports she has a Hx of MS which affects her speech. EDP made aware and at bedside to evaluate

## 2019-12-15 ENCOUNTER — Ambulatory Visit: Payer: Medicare PPO | Admitting: Neurology

## 2019-12-15 ENCOUNTER — Other Ambulatory Visit: Payer: Self-pay

## 2019-12-15 ENCOUNTER — Encounter: Payer: Self-pay | Admitting: Neurology

## 2019-12-15 VITALS — BP 137/76 | HR 98 | Temp 98.6°F | Ht 64.0 in | Wt 298.0 lb

## 2019-12-15 DIAGNOSIS — IMO0002 Reserved for concepts with insufficient information to code with codable children: Secondary | ICD-10-CM

## 2019-12-15 DIAGNOSIS — G43709 Chronic migraine without aura, not intractable, without status migrainosus: Secondary | ICD-10-CM | POA: Insufficient documentation

## 2019-12-15 DIAGNOSIS — G35 Multiple sclerosis: Secondary | ICD-10-CM | POA: Insufficient documentation

## 2019-12-15 MED ORDER — SUMATRIPTAN SUCCINATE 50 MG PO TABS: 50.0000 mg | ORAL_TABLET | ORAL | 5 refills | Status: DC | PRN

## 2019-12-15 MED ORDER — TOPIRAMATE 100 MG PO TABS: 100.0000 mg | ORAL_TABLET | Freq: Every evening | ORAL | 11 refills | Status: DC

## 2019-12-15 MED ORDER — ONDANSETRON 4 MG PO TBDP: 4.0000 mg | ORAL_TABLET | Freq: Three times a day (TID) | ORAL | 6 refills | Status: DC | PRN

## 2019-12-15 NOTE — Progress Notes (Signed)
PATIENT: [name] DOB: 05-28-1971  REASON FOR VISIT: follow up HISTORY FROM: patient  HISTORY OF PRESENT ILLNESS: [name] is a 49 year-old right-handed [place] American single female from [place], [place], she was diagnosed with multiple sclerosis 10/2004 with positive MRI of the brain and 6 oligoclonal bands in CSF. She was patient of Dr. Sandria George. She was begun on Avonex. 03/12/2006 she had new cervical medullary lesion and was changed to rebif. She is in the EPOC study by Dr. Jodi George, has been treated with Gilenya since 05/24/10. She is not having any side effects from the medication. She had a normal examination at the [facility] clinic checking for macular edema on 08/17/10.   06/25/11 MRI of the brain and cervical spine showed a few scattered supratentorial nonspecific foci of T2 hyperintensity without enhancement and no change versus 04/19/09. There was disc bulging at C5-6 and C6-7 without cord lesions present and no change versus 06/16/08.   She also has hypothyroidism, diabetes, obstructive sleep apnea She is using CPAP At 13 cm of water with ESS 3. She had pulmonary function tests because of decreased breath sounds which were normal. 07/08/2012 she awoke with nausea and shaking of her right hand and arm, and inability to speak. Her right leg "did not want to go". The following day her speech was hesitant but well-formed. She was treated by iv followed by po steroid.   UPDATE June 5th 2014:  Since her last visit in May 20 third, she had multiple phone call to our office, with constellation of complains, including increased bilateral lower extremity tremor, gait difficulty, continued language difficulty, she is taking baclofen 20 mg 3 times a day, Xanax 0. 2 5 mg twice a day per instruction of on-call physician.   UPDATE July 7th 2014 She presented with progressive weakness of left lower extremity and difficulty with walking as well as progression  of tremor affecting right extremities more than left extremities and also affecting her speech. She was admitted for a course of Solu-Medrol intravenously, 500 mg every 12 hours x6 from June 16th to June 18th, which has been very helpful. She wants home physical therapy MRI of brain showed few periventricular and subcortical T2 hyperintensities. Some of these are hypointense on T1 views. No abnormal lesions are seen on post contrast views   She was seen by [name] in March 3rd, reported that In February 28 she awakened with a numbness in her right leg and right arm and weakness having trouble with her ambulation. She is having problems with her speech. She received five days of iv solumedrol, which did help her symptoms moderately, but she complains of upset stomach, loss of appetite, elevated glucose, mother complains of labile emotion  UPDATE Sep 13 2015: She presented with increased right side weakness in early Dec 2016, she was admitted to hospital in September 08 2015, there was no evidence of urinary tract infection, she was given a dose of Solu-Medrol, which did help her symptoms some  I have reviewed MRI brain: No acute infarct or evidence of active demyelination.Multiple sclerosis with single new right frontal plaque comparedto brain MRI 03/04/2014.  MRI cervical Moderately motion degraded examination. No definite spinal cord lesion identified. Mild disc and facet degeneration   I reviewed laboratory evaluation September 08 2015 showed normal CBC, Hg 11.3  Update November 26 2016: She is doing well there was no recurrent multiple sclerosis flareup, we have personally reviewed MRI of the brain and cervical  spine in November 2017, mild multilevel cervical degenerative changes, no canal stenosis, no cervical spinal cord signal abnormality.  MRI of the brain showed scattered T2/FLAIR hyperintensity signal at juxtacortical, periventricular deep white matters, no contrast enhancement no  change compared to previous scan in December 2016. She complains of gait abnormality, low back pain, right hip pain, is taking chronic narcotics pain medications.  fatigue, taking provigil 200 mg daily   Reviewed laboratory evaluation on October 08 2016, CBC showed no significant abnormality. Normal CMP  MCH - 25 L MCHC - 30.2 L MPV - 10.6 H NE% - 81.4 H LY% - 6.6 L  UPDATE 02/11/18: She has been overall doing very well, there was no recurrent spells of MS exacerbation, tolerating Gilenya, is seen by her primary care physician on monthly basis, she continue have spells of anxiety, taking prozac 20 mg every day, she takes Xanax 0.25 mg twice a day as needed for anxiety and for insomnia,  UPDATE December 15 2019: [name] came into clinic today with her typical accident, reported that was brought on by stress, she denies any other flareup,  I have personally reviewed MRI of the brain with without contrast July 12, 2019, there are a few T2/flair hyperintensity foci in the pons, hemisphere, no change compared to previous MRI  MRI of cervical spine was normal, multilevel degenerative changes  Over the years, I have talked with her multiple times about stopping immunomodulation therapy because of her such benign clinical course and imaging findings, patient is very hesitate to stop Gilenya treatment,  Today she brought up the issue of COVID-19 vaccination while taking Gilenya, is willing to consider the possibility of stop immunomodulation therapy for a while, understanding if she has stopped Gilenya for more than 2 weeks, she needs to go through first dose observation again,  She also complains of frequent headaches, nausea since January 2021, she is under a lot of family stress, she complains of extreme smell sensitivity, even unfamiliar smell in her car will trigger her nausea, pressure headache on the left side, light sensitive, movement made it worse, she performed lie down in dark quiet  room resting for a while,  She also has a history of fatigue, taking Provigil, Chronic pain, taking Lyrica, meloxicam She has worsening depression, taking Prozac, Cymbalta  REVIEW OF SYSTEMS: Out of a complete 14 system review of symptoms, the patient complains only of the following symptoms, and all other reviewed systems are negative.  As above ALLERGIES: No Known Allergies  HOME MEDICATIONS: Outpatient Medications Prior to Visit  Medication Sig Dispense Refill  . amLODipine (NORVASC) 10 MG tablet Take 1 tablet by mouth daily.  2  . baclofen (LIORESAL) 20 MG tablet TAKE 1 TABLET BY MOUTH THREE TIMES DAILY 90 tablet 11  . cetirizine (ZYRTEC) 10 MG tablet Take 10 mg by mouth daily.    . Cholecalciferol (VITAMIN D) 2000 units CAPS Take 1 capsule by mouth daily.    Marland Kitchen docusate sodium (COLACE) 100 MG capsule Take 100 mg by mouth 2 (two) times daily.     . DULoxetine (CYMBALTA) 60 MG capsule Take 60 mg by mouth daily.    . ferrous sulfate 325 (65 FE) MG tablet Take 325 mg by mouth daily with breakfast.    . Fingolimod HCl (GILENYA) 0.5 MG CAPS Take 1 capsule (0.5 mg total) by mouth daily. 90 capsule 3  . FLUoxetine (PROZAC) 40 MG capsule TAKE 1 CAPSULE(40 MG) BY MOUTH DAILY 90 capsule 4  . fluticasone (  FLONASE) 50 MCG/ACT nasal spray Place 2 sprays into the nose daily.    . hydrochlorothiazide (HYDRODIURIL) 12.5 MG tablet TK 1 T PO D  2  . ketoconazole (NIZORAL) 2 % cream Apply 1 application topically as needed.     Marland Kitchen levothyroxine (SYNTHROID, LEVOTHROID) 125 MCG tablet Take 125 mcg by mouth daily before breakfast.    . LYRICA 200 MG capsule TK ONE C PO HS  2  . meloxicam (MOBIC) 15 MG tablet Take 15 mg by mouth daily as needed for pain.    . modafinil (PROVIGIL) 200 MG tablet Take 1 tablet (200 mg total) by mouth daily. 90 tablet 1  . morphine (MS CONTIN) 60 MG 12 hr tablet Take 60 mg by mouth every 12 (twelve) hours.    . Multiple Vitamins-Minerals (MULTIVITAMIN PO) Take 1 tablet by  mouth daily.     Marland Kitchen omeprazole (PRILOSEC) 40 MG capsule TK 1 C PO D    . oxyCODONE-acetaminophen (PERCOCET) 7.5-325 MG per tablet Take 1 tablet by mouth 3 (three) times daily.     . polyethylene glycol (MIRALAX / GLYCOLAX) packet Take 17 g by mouth daily as needed.     . pantoprazole (PROTONIX) 40 MG tablet Take 1 tablet (40 mg total) by mouth daily. 30 tablet 1   No facility-administered medications prior to visit.    PAST MEDICAL HISTORY: Past Medical History:  Diagnosis Date  . Acid reflux   . Diabetes mellitus without complication (HCC)    states she was "prediabetic" at one time  . Fibromyalgia   . MS (multiple sclerosis) (HCC)   . Sleep apnea   . Thyroid disease     PAST SURGICAL HISTORY: Past Surgical History:  Procedure Laterality Date  . ENDOMETRIAL BIOPSY    . HERNIA REPAIR      FAMILY HISTORY: Family History  Problem Relation Age of Onset  . Hypertension Mother   . Migraines Mother   . High blood pressure Father   . Lung cancer Father     SOCIAL HISTORY: Social History   Socioeconomic History  . Marital status: Divorced    Spouse name: Not on file  . Number of children: 1  . Years of education: college  . Highest education level: Not on file  Occupational History    Employer: UNEMPLOYED    Comment: disabled  Tobacco Use  . Smoking status: Former Smoker    Quit date: 10/01/1988    Years since quitting: 31.2  . Smokeless tobacco: Never Used  Substance and Sexual Activity  . Alcohol use: No    Alcohol/week: 0.0 standard drinks    Comment: Quit alcohol consumption in 1990's  . Drug use: No  . Sexual activity: Not on file  Other Topics Concern  . Not on file  Social History Narrative   She lives with her mother, daughter, she is on disability.    Right handed.   Education college.   Caffeine soda 2 daily   Social Determinants of Health   Financial Resource Strain:   . Difficulty of Paying Living Expenses:   Food Insecurity:   . Worried About  Programme researcher, broadcasting/film/video in the Last Year:   . Barista in the Last Year:   Transportation Needs:   . Freight forwarder (Medical):   Marland Kitchen Lack of Transportation (Non-Medical):   Physical Activity:   . Days of Exercise per Week:   . Minutes of Exercise per Session:   Stress:   .  Feeling of Stress :   Social Connections:   . Frequency of Communication with Friends and Family:   . Frequency of Social Gatherings with Friends and Family:   . Attends Religious Services:   . Active Member of Clubs or Organizations:   . Attends Banker Meetings:   Marland Kitchen Marital Status:   Intimate Partner Violence:   . Fear of Current or Ex-Partner:   . Emotionally Abused:   Marland Kitchen Physically Abused:   . Sexually Abused:       PHYSICAL EXAM  Vitals:   12/15/19 0928  BP: 137/76  Pulse: 98  Temp: 98.6 F (37 C)  Weight: 298 lb (135.2 kg)  Height: 5\' 4"  (1.626 m)   Body mass index is 51.15 kg/m.   PHYSICAL EXAMNIATION:  Gen: NAD, conversant, well nourised, well groomed                     Cardiovascular: Regular rate rhythm, no peripheral edema, warm, nontender. Eyes: Conjunctivae clear without exudates or hemorrhage Neck: Supple, no carotid bruits. Pulmonary: Clear to auscultation bilaterally   NEUROLOGICAL EXAM:  MENTAL STATUS: Speech:    Speech is normal; fluent and spontaneous with normal comprehension.  But with accent Cognition:     Orientation to time, place and person     Normal recent and remote memory     Normal Attention span and concentration     Normal Language, naming, repeating,spontaneous speech     Fund of knowledge   CRANIAL NERVES: CN II: Visual fields are full to confrontation.  Pupils are round equal and briskly reactive to light. CN III, IV, VI: extraocular movement are normal. No ptosis. CN V: Facial sensation is intact to pinprick in all 3 divisions bilaterally. Corneal responses are intact.  CN VII: Face is symmetric with normal eye closure and  smile. CN VIII: Hearing is normal to casual conversation CN IX, X: Palate elevates symmetrically. Phonation is normal. CN XI: Head turning and shoulder shrug are intact CN XII: Tongue is midline with normal movements and no atrophy.  MOTOR: There is no pronator drift of out-stretched arms. Muscle bulk and tone are normal. Muscle strength is normal.  REFLEXES: Reflexes are 2+ and symmetric at the biceps, triceps, knees, and ankles. Plantar responses are flexor.  SENSORY: Intact to light touch, pinprick, positional and vibratory sensation are intact in fingers and toes.  COORDINATION: Rapid alternating movements and fine finger movements are intact. There is no dysmetria on finger-to-nose and heel-knee-shin.    GAIT/STANCE: She can get up arms crossed, steady, leaning forward,   DIAGNOSTIC DATA (LABS, IMAGING, TESTING) - I reviewed patient records, labs, notes, testing and imaging myself where available.  Lab Results  Component Value Date   WBC 6.0 11/22/2019   HGB 11.8 (L) 11/22/2019   HCT 37.8 11/22/2019   MCV 82.0 11/22/2019   PLT 266 11/22/2019      Component Value Date/Time   NA 139 11/22/2019 1904   NA 141 06/15/2019 1010   K 3.4 (L) 11/22/2019 1904   CL 100 11/22/2019 1904   CO2 29 11/22/2019 1904   GLUCOSE 116 (H) 11/22/2019 1904   BUN 8 11/22/2019 1904   BUN 11 06/15/2019 1010   CREATININE 0.99 11/22/2019 1904   CALCIUM 8.6 (L) 11/22/2019 1904   PROT 6.9 06/15/2019 1010   ALBUMIN 4.3 06/15/2019 1010   AST 24 06/15/2019 1010   ALT 12 06/15/2019 1010   ALKPHOS 111 06/15/2019 1010  BILITOT 0.5 06/15/2019 1010   GFRNONAA >60 11/22/2019 1904   GFRAA >60 11/22/2019 1904   No results found for: CHOL, HDL, LDLCALC, LDLDIRECT, TRIG, CHOLHDL Lab Results  Component Value Date   HGBA1C 5.5 03/16/2013   No results found for: VITAMINB12 Lab Results  Component Value Date   TSH 0.138 (L) 03/16/2013    ASSESSMENT AND PLAN 49 y.o. 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  She presented with strange accent, apparently variable effort  Multiple repeat MRI of the brain showed stable to nonenhancing T2/flair lesions, MRI of cervical spine was normal  She has been treated with Gilenya since August 2011  We have discussed the possibility of stop immunomodulation therapy, because of her stable clinical and imaging course, she will consider about it, understanding if she stopped Gilenya more than 2 weeks, she has to go through first dose observation,  She can get Covid vaccination, but may not mount robust response because that she is taking Gilenya  Chronic migraine headaches  Starting Topamax 100 mg every night as preventive medications  Imitrex as needed Zofran as needed  Chronic fatigue  Is on Provigil 200 mg daily  Depression anxiety, chronic pain,  Is on polypharmacy treatment, including MS Contin 60 mg every 12 hours, Cymbalta, Prozac, baclofen,   Levert Feinstein, M.D. Ph.D.  [facility] [address] [place], [place] [address] Phone: [phone] Fax:      [phone]

## 2020-02-21 ENCOUNTER — Other Ambulatory Visit: Payer: Self-pay | Admitting: Neurology

## 2020-02-21 DIAGNOSIS — L408 Other psoriasis: Secondary | ICD-10-CM

## 2020-02-21 DIAGNOSIS — G35 Multiple sclerosis: Secondary | ICD-10-CM

## 2020-04-26 ENCOUNTER — Other Ambulatory Visit: Payer: Self-pay | Admitting: Neurology

## 2020-05-02 ENCOUNTER — Other Ambulatory Visit: Payer: Self-pay | Admitting: *Deleted

## 2020-05-02 MED ORDER — FLUOXETINE HCL 40 MG PO CAPS: ORAL_CAPSULE | ORAL | 3 refills | Status: DC

## 2020-06-15 NOTE — Progress Notes (Signed)
PATIENT: [name] DOB: 09-02-1971  REASON FOR VISIT: follow up HISTORY FROM: patient  HISTORY OF PRESENT ILLNESS: Today 06/16/20  HISTORY  [name] is a 49 year-old right-handed [place] American single female from [place], [place], she was diagnosed with multiple sclerosis 10/2004 with positive MRI of the brain and 6 oligoclonal bands in CSF. She was patient of Dr. Sandria Manly. She was begun on Avonex. 03/12/2006 she had new cervical medullary lesion and was changed to rebif. She is in the EPOC study by Dr. Jodi Mourning, has been treated with Gilenya since 05/24/10. She is not having any side effects from the medication. She had a normal examination at the [facility] specialists clinic checking for macular edema on 08/17/10.   06/25/11 MRI of the brain and cervical spine showed a few scattered supratentorial nonspecific foci of T2 hyperintensity without enhancement and no change versus 04/19/09. There was disc bulging at C5-6 and C6-7 without cord lesions present and no change versus 06/16/08.   She also has hypothyroidism, diabetes, obstructive sleep apnea She is using CPAP At 13 cm of water with ESS 3. She had pulmonary function tests because of decreased breath sounds which were normal. 07/08/2012 she awoke with nausea and shaking of her right hand and arm, and inability to speak. Her right leg "did not want to go". The following day her speech was hesitant but well-formed. She was treated by iv followed by po steroid.   UPDATE June 5th 2014:  Since her last visit in May 20 third, she had multiple phone call to our office, with constellation of complains, including increased bilateral lower extremity tremor, gait difficulty, continued language difficulty, she is taking baclofen 20 mg 3 times a day, Xanax 0. 2 5 mg twice a day per instruction of on-call physician.   UPDATE July 7th 2014 She presented with progressive weakness of left lower extremity and difficulty with  walking as well as progression of tremor affecting right extremities more than left extremities and also affecting her speech. She was admitted for a course of Solu-Medrol intravenously, 500 mg every 12 hours x6 from June 16th to June 18th, which has been very helpful. She wants home physical therapy MRI of brain showed few periventricular and subcortical T2 hyperintensities. Some of these are hypointense on T1 views. No abnormal lesions are seen on post contrast views   She was seen by [name] in March 3rd, reported that In February 28 she awakened with a numbness in her right leg and right arm and weakness having trouble with her ambulation. She is having problems with her speech. She received five days of iv solumedrol, which did help her symptoms moderately, but she complains of upset stomach, loss of appetite, elevated glucose, mother complains of labile emotion  UPDATE Sep 13 2015: She presented with increased right side weakness in early Dec 2016, she was admitted to hospital in September 08 2015, there was no evidence of urinary tract infection, she was given a dose of Solu-Medrol, which did help her symptoms some  I have reviewed MRI brain: No acute infarct or evidence of active demyelination.Multiple sclerosis with single new right frontal plaque comparedto brain MRI 03/04/2014.  MRI cervical Moderately motion degraded examination. No definite spinal cord lesion identified. Mild disc and facet degeneration   I reviewed laboratory evaluation September 08 2015 showed normal CBC, Hg 11.3  Update November 26 2016: She is doing well there was no recurrent multiple sclerosis flareup, we have personally reviewed MRI  of the brain and cervical spine in November 2017, mild multilevel cervical degenerative changes, no canal stenosis, no cervical spinal cord signal abnormality.  MRI of the brain showed scattered T2/FLAIR hyperintensity signal at juxtacortical, periventricular deep white  matters, no contrast enhancement no change compared to previous scan in December 2016. She complains of gait abnormality, low back pain, right hip pain, is taking chronic narcotics pain medications.  fatigue, taking provigil 200 mg daily   Reviewed laboratory evaluation on October 08 2016, CBC showed no significant abnormality. Normal CMP  MCH - 25 L MCHC - 30.2 L MPV - 10.6 H NE% - 81.4 H LY% - 6.6 L  UPDATE 02/11/18: She has been overall doing very well, there was no recurrent spells of MS exacerbation, tolerating Gilenya, is seen by her primary care physician on monthly basis, she continue have spells of anxiety, taking prozac 20 mg every day, she takes Xanax 0.25 mg twice a day as needed for anxiety and for insomnia,  UPDATE December 15 2019: [name] came into clinic today with her typical accident, reported that was brought on by stress, she denies any other flareup,  I have personally reviewed MRI of the brain with without contrast July 12, 2019, there are a few T2/flair hyperintensity foci in the pons, hemisphere, no change compared to previous MRI  MRI of cervical spine was normal, multilevel degenerative changes  Over the years, I have talked with her multiple times about stopping immunomodulation therapy because of her such benign clinical course and imaging findings, patient is very hesitate to stop Gilenya treatment,  Today she brought up the issue of COVID-19 vaccination while taking Gilenya, is willing to consider the possibility of stop immunomodulation therapy for a while, understanding if she has stopped Gilenya for more than 2 weeks, she needs to go through first dose observation again,  She also complains of frequent headaches, nausea since January 2021, she is under a lot of family stress, she complains of extreme smell sensitivity, even unfamiliar smell in her car will trigger her nausea, pressure headache on the left side, light sensitive, movement made it  worse, she performed lie down in dark quiet room resting for a while,  She also has a history of fatigue, taking Provigil, Chronic pain, taking Lyrica, meloxicam She has worsening depression, taking Prozac, Cymbalta  Update June 16, 2020 SS: When last seen, started Topamax 100 mg at bedtime, Imitrex as needed for migraine.  We are prescribing baclofen, Prozac, Gilenya, Provigil, Zofran.  Is on chronic opioids, Percocet, MS Contin for chronic back pain and fibromyalgia from PCP.  Decided to remain on Gilenya, feels has kept stable over the years, afraid more lesions will come.  Migraines improved.  She is using a cane, drives a car.  No change in MS status.  Provigil 200 mg daily, stimulates her too much.  She is overall well, walking more forward leaning today, still with accent.  CMP from PCP in August was unremarkable. Will get CBC today.   REVIEW OF SYSTEMS: Out of a complete 14 system review of symptoms, the patient complains only of the following symptoms, and all other reviewed systems are negative.  Chronic pain, headache  ALLERGIES: No Known Allergies  HOME MEDICATIONS: Outpatient Medications Prior to Visit  Medication Sig Dispense Refill   amLODipine (NORVASC) 10 MG tablet Take 1 tablet by mouth daily.  2   baclofen (LIORESAL) 20 MG tablet TAKE 1 TABLET BY MOUTH THREE TIMES DAILY 90 tablet 11  cetirizine (ZYRTEC) 10 MG tablet Take 10 mg by mouth daily.     Cholecalciferol (VITAMIN D) 2000 units CAPS Take 1 capsule by mouth daily.     docusate sodium (COLACE) 100 MG capsule Take 100 mg by mouth 2 (two) times daily.      DULoxetine (CYMBALTA) 60 MG capsule Take 60 mg by mouth daily.     ferrous sulfate 325 (65 FE) MG tablet Take 325 mg by mouth daily with breakfast.     FLUoxetine (PROZAC) 40 MG capsule TAKE 1 CAPSULE(40 MG) BY MOUTH DAILY 90 capsule 3   fluticasone (FLONASE) 50 MCG/ACT nasal spray Place 2 sprays into the nose daily.     GILENYA 0.5 MG CAPS TAKE 1  CAPSULE (0.5MG ) BY MOUTH DAILY 90 capsule 3   hydrochlorothiazide (HYDRODIURIL) 12.5 MG tablet TK 1 T PO D  2   ketoconazole (NIZORAL) 2 % cream Apply 1 application topically as needed.      levothyroxine (SYNTHROID) 137 MCG tablet Take 137 mcg by mouth daily.     LYRICA 200 MG capsule TK ONE C PO HS  2   meloxicam (MOBIC) 15 MG tablet Take 15 mg by mouth daily as needed for pain.     morphine (MS CONTIN) 60 MG 12 hr tablet Take 60 mg by mouth every 12 (twelve) hours.     Multiple Vitamins-Minerals (MULTIVITAMIN PO) Take 1 tablet by mouth daily.      omeprazole (PRILOSEC) 40 MG capsule TK 1 C PO D     ondansetron (ZOFRAN ODT) 4 MG disintegrating tablet Take 1 tablet (4 mg total) by mouth every 8 (eight) hours as needed. 20 tablet 6   oxyCODONE-acetaminophen (PERCOCET) 7.5-325 MG per tablet Take 1 tablet by mouth 3 (three) times daily.      polyethylene glycol (MIRALAX / GLYCOLAX) packet Take 17 g by mouth daily as needed.      SUMAtriptan (IMITREX) 50 MG tablet Take 1 tablet (50 mg total) by mouth every 2 (two) hours as needed for migraine. May repeat in 2 hours if headache persists or recurs. 12 tablet 5   modafinil (PROVIGIL) 200 MG tablet Take 1 tablet (200 mg total) by mouth daily. 90 tablet 1   topiramate (TOPAMAX) 100 MG tablet Take 1 tablet (100 mg total) by mouth at bedtime. 30 tablet 11   levothyroxine (SYNTHROID, LEVOTHROID) 125 MCG tablet Take 125 mcg by mouth daily before breakfast.     No facility-administered medications prior to visit.    PAST MEDICAL HISTORY: Past Medical History:  Diagnosis Date   Acid reflux    Diabetes mellitus without complication (HCC)    states she was "prediabetic" at one time   Fibromyalgia    MS (multiple sclerosis) (HCC)    Sleep apnea    Thyroid disease     PAST SURGICAL HISTORY: Past Surgical History:  Procedure Laterality Date   ENDOMETRIAL BIOPSY     HERNIA REPAIR      FAMILY HISTORY: Family History   Problem Relation Age of Onset   Hypertension Mother    Migraines Mother    High blood pressure Father    Lung cancer Father     SOCIAL HISTORY: Social History   Socioeconomic History   Marital status: Divorced    Spouse name: Not on file   Number of children: 1   Years of education: college   Highest education level: Not on file  Occupational History    Employer: UNEMPLOYED    Comment:  disabled  Tobacco Use   Smoking status: Former Smoker    Quit date: 10/01/1988    Years since quitting: 31.7   Smokeless tobacco: Never Used  Building services engineer Use: Never used  Substance and Sexual Activity   Alcohol use: No    Alcohol/week: 0.0 standard drinks    Comment: Quit alcohol consumption in 1990's   Drug use: No   Sexual activity: Not on file  Other Topics Concern   Not on file  Social History Narrative   She lives with her mother, daughter, she is on disability.    Right handed.   Education college.   Caffeine soda 2 daily   Social Determinants of Health   Financial Resource Strain:    Difficulty of Paying Living Expenses: Not on file  Food Insecurity:    Worried About Running Out of Food in the Last Year: Not on file   Ran Out of Food in the Last Year: Not on file  Transportation Needs:    Lack of Transportation (Medical): Not on file   Lack of Transportation (Non-Medical): Not on file  Physical Activity:    Days of Exercise per Week: Not on file   Minutes of Exercise per Session: Not on file  Stress:    Feeling of Stress : Not on file  Social Connections:    Frequency of Communication with Friends and Family: Not on file   Frequency of Social Gatherings with Friends and Family: Not on file   Attends Religious Services: Not on file   Active Member of Clubs or Organizations: Not on file   Attends Banker Meetings: Not on file   Marital Status: Not on file  Intimate Partner Violence:    Fear of Current or Ex-Partner:  Not on file   Emotionally Abused: Not on file   Physically Abused: Not on file   Sexually Abused: Not on file   PHYSICAL EXAM  Vitals:   06/16/20 1002  BP: 126/72  Pulse: 85  Weight: (!) 304 lb 9.6 oz (138.2 kg)  Height: 5\' 4"  (1.626 m)   Body mass index is 52.28 kg/m.  Generalized: Well developed, in no acute distress  Neurological examination  Mentation: Alert oriented to time, place, history taking. Follows all commands speech and language fluent with accent Cranial nerve II-XII: Pupils were equal round reactive to light. Extraocular movements were full, visual field were full on confrontational test. Facial sensation and strength were normal.  Head turning and shoulder shrug were normal and symmetric. Motor: The motor testing reveals 5 over 5 strength of all 4 extremities. Good symmetric motor tone is noted throughout.  Sensory: Sensory testing is intact to soft touch on all 4 extremities. No evidence of extinction is noted.  Coordination: Cerebellar testing reveals good finger-nose-finger and heel-to-shin bilaterally.  Gait and station: Gait is normal, but forward leaning today, slight limp on the right, using single-point cane Reflexes: Deep tendon reflexes are symmetric and normal bilaterally.   DIAGNOSTIC DATA (LABS, IMAGING, TESTING) - I reviewed patient records, labs, notes, testing and imaging myself where available.  Lab Results  Component Value Date   WBC 6.0 11/22/2019   HGB 11.8 (L) 11/22/2019   HCT 37.8 11/22/2019   MCV 82.0 11/22/2019   PLT 266 11/22/2019      Component Value Date/Time   NA 139 11/22/2019 1904   NA 141 06/15/2019 1010   K 3.4 (L) 11/22/2019 1904   CL 100 11/22/2019 1904  CO2 29 11/22/2019 1904   GLUCOSE 116 (H) 11/22/2019 1904   BUN 8 11/22/2019 1904   BUN 11 06/15/2019 1010   CREATININE 0.99 11/22/2019 1904   CALCIUM 8.6 (L) 11/22/2019 1904   PROT 6.9 06/15/2019 1010   ALBUMIN 4.3 06/15/2019 1010   AST 24 06/15/2019 1010    ALT 12 06/15/2019 1010   ALKPHOS 111 06/15/2019 1010   BILITOT 0.5 06/15/2019 1010   GFRNONAA >60 11/22/2019 1904   GFRAA >60 11/22/2019 1904   No results found for: CHOL, HDL, LDLCALC, LDLDIRECT, TRIG, CHOLHDL Lab Results  Component Value Date   HGBA1C 5.5 03/16/2013   No results found for: VITAMINB12 Lab Results  Component Value Date   TSH 0.138 (L) 03/16/2013   ASSESSMENT AND PLAN 49 y.o. year old female  has a past medical history of Acid reflux, Diabetes mellitus without complication (HCC), Fibromyalgia, MS (multiple sclerosis) (HCC), Sleep apnea, and Thyroid disease. here with:  1.  [name] -Presented with strange accent -Multiple repeat MRI of the brain showed stable to nonenhancing T2/flair lesions, MRI of cervical spine was normal In October 2020 -On Gilenya since August 2011, chosen to remain on immunomodulation therapy, despite stable clinical and imaging course -Still didn't get Covid vaccine yet-we still recommend but may not have same robust response given on Gilenya -Check CBC with diff today, reviewed CMP was okay  2.  Chronic migraine headaches -Continue Topamax 100 mg at bedtime -Continue Imitrex as needed, may combine with Zofran  3.  Chronic fatigue -Decrease Provigil 100 mg daily, 200 mg was too much  4.  Depression, anxiety, chronic pain -Is on polypharmacy treatment, MS Contin, Cymbalta, Prozac, baclofen  I spent 30 minutes of face-to-face and non-face-to-face time with patient.  This included previsit chart review, lab review, study review, order entry, electronic health record documentation, patient education.  Margie Ege, AGNP-C, DNP 06/16/2020, 10:36 AM [facility] [address] [place], [place] [address] [phone]

## 2020-06-16 ENCOUNTER — Ambulatory Visit: Payer: Medicare PPO | Admitting: Neurology

## 2020-06-16 ENCOUNTER — Other Ambulatory Visit: Payer: Self-pay

## 2020-06-16 ENCOUNTER — Encounter: Payer: Self-pay | Admitting: Neurology

## 2020-06-16 VITALS — BP 126/72 | HR 85 | Ht 64.0 in | Wt 304.6 lb

## 2020-06-16 DIAGNOSIS — G35 Multiple sclerosis: Secondary | ICD-10-CM

## 2020-06-16 DIAGNOSIS — G43709 Chronic migraine without aura, not intractable, without status migrainosus: Secondary | ICD-10-CM | POA: Diagnosis not present

## 2020-06-16 DIAGNOSIS — IMO0002 Reserved for concepts with insufficient information to code with codable children: Secondary | ICD-10-CM

## 2020-06-16 MED ORDER — MODAFINIL 100 MG PO TABS: 100.0000 mg | ORAL_TABLET | Freq: Every day | ORAL | 5 refills | Status: DC

## 2020-06-16 MED ORDER — TOPIRAMATE 100 MG PO TABS: 100.0000 mg | ORAL_TABLET | Freq: Every evening | ORAL | 11 refills | Status: DC

## 2020-06-16 NOTE — Patient Instructions (Signed)
Decrease Provigil to 100 mg daily Continue other medications Check CBC today  See you back in 6 months

## 2020-06-17 LAB — CBC WITH DIFFERENTIAL/PLATELET
Basophils Absolute: 0 10*3/uL (ref 0.0–0.2)
Basos: 0 %
EOS (ABSOLUTE): 0 10*3/uL (ref 0.0–0.4)
Eos: 0 %
Hematocrit: 35.8 % (ref 34.0–46.6)
Hemoglobin: 11.3 g/dL (ref 11.1–15.9)
Immature Grans (Abs): 0.1 10*3/uL (ref 0.0–0.1)
Immature Granulocytes: 1 %
Lymphocytes Absolute: 0.4 10*3/uL — ABNORMAL LOW (ref 0.7–3.1)
Lymphs: 6 %
MCH: 26.4 pg — ABNORMAL LOW (ref 26.6–33.0)
MCHC: 31.6 g/dL (ref 31.5–35.7)
MCV: 84 fL (ref 79–97)
Monocytes Absolute: 0.5 10*3/uL (ref 0.1–0.9)
Monocytes: 9 %
Neutrophils Absolute: 4.8 10*3/uL (ref 1.4–7.0)
Neutrophils: 84 %
Platelets: 243 10*3/uL (ref 150–450)
RBC: 4.28 x10E6/uL (ref 3.77–5.28)
RDW: 13.7 % (ref 11.7–15.4)
WBC: 5.8 10*3/uL (ref 3.4–10.8)

## 2020-11-09 ENCOUNTER — Telehealth: Payer: Self-pay | Admitting: *Deleted

## 2020-11-09 ENCOUNTER — Encounter: Payer: Self-pay | Admitting: *Deleted

## 2020-11-09 DIAGNOSIS — G4733 Obstructive sleep apnea (adult) (pediatric): Secondary | ICD-10-CM | POA: Insufficient documentation

## 2020-11-09 NOTE — Telephone Encounter (Signed)
PA for modafinil 100mg  completed through covermymeds (key ). Pt has coverage with [employer] : [id number]). PA approved through 09/30/2021.

## 2020-12-14 NOTE — Progress Notes (Signed)
PATIENT: [name] DOB: 1971-03-17  REASON FOR VISIT: follow up HISTORY FROM: patient  HISTORY OF PRESENT ILLNESS: Today 12/15/20  HISTORY  [name] is a 50 year-old right-handed [place] American single female from [place], [place], she was diagnosed with multiple sclerosis 10/2004 with positive MRI of the brain and 6 oligoclonal bands in CSF. She was patient of Dr. Sandria Manly. She was begun on Avonex. 03/12/2006 she had new cervical medullary lesion and was changed to rebif. She is in the EPOC study by Dr. Jodi Mourning, has been treated with Gilenya since 05/24/10. She is not having any side effects from the medication. She had a normal examination at the [facility] clinic checking for macular edema on 08/17/10.   06/25/11 MRI of the brain and cervical spine showed a few scattered supratentorial nonspecific foci of T2 hyperintensity without enhancement and no change versus 04/19/09. There was disc bulging at C5-6 and C6-7 without cord lesions present and no change versus 06/16/08.   She also has hypothyroidism, diabetes, obstructive sleep apnea She is using CPAP At 13 cm of water with ESS 3. She had pulmonary function tests because of decreased breath sounds which were normal. 07/08/2012 she awoke with nausea and shaking of her right hand and arm, and inability to speak. Her right leg "did not want to go". The following day her speech was hesitant but well-formed. She was treated by iv followed by po steroid.   UPDATE June 5th 2014:  Since her last visit in May 20 third, she had multiple phone call to our office, with constellation of complains, including increased bilateral lower extremity tremor, gait difficulty, continued language difficulty, she is taking baclofen 20 mg 3 times a day, Xanax 0. 2 5 mg twice a day per instruction of on-call physician.   UPDATE July 7th 2014 She presented with progressive weakness of left lower extremity and difficulty with  walking as well as progression of tremor affecting right extremities more than left extremities and also affecting her speech. She was admitted for a course of Solu-Medrol intravenously, 500 mg every 12 hours x6 from June 16th to June 18th, which has been very helpful. She wants home physical therapy MRI of brain showed few periventricular and subcortical T2 hyperintensities. Some of these are hypointense on T1 views. No abnormal lesions are seen on post contrast views   She was seen by [name] in March 3rd, reported that In February 28 she awakened with a numbness in her right leg and right arm and weakness having trouble with her ambulation. She is having problems with her speech. She received five days of iv solumedrol, which did help her symptoms moderately, but she complains of upset stomach, loss of appetite, elevated glucose, mother complains of labile emotion  UPDATE Sep 13 2015: She presented with increased right side weakness in early Dec 2016, she was admitted to hospital in September 08 2015, there was no evidence of urinary tract infection, she was given a dose of Solu-Medrol, which did help her symptoms some  I have reviewed MRI brain: No acute infarct or evidence of active demyelination.Multiple sclerosis with single new right frontal plaque comparedto brain MRI 03/04/2014.  MRI cervical Moderately motion degraded examination. No definite spinal cord lesion identified. Mild disc and facet degeneration   I reviewed laboratory evaluation September 08 2015 showed normal CBC, Hg 11.3  Update November 26 2016: She is doing well there was no recurrent multiple sclerosis flareup, we have personally reviewed MRI  of the brain and cervical spine in November 2017, mild multilevel cervical degenerative changes, no canal stenosis, no cervical spinal cord signal abnormality.  MRI of the brain showed scattered T2/FLAIR hyperintensity signal at juxtacortical, periventricular deep white  matters, no contrast enhancement no change compared to previous scan in December 2016. She complains of gait abnormality, low back pain, right hip pain, is taking chronic narcotics pain medications.  fatigue, taking provigil 200 mg daily   Reviewed laboratory evaluation on October 08 2016, CBC showed no significant abnormality. Normal CMP  MCH - 25 L MCHC - 30.2 L MPV - 10.6 H NE% - 81.4 H LY% - 6.6 L  UPDATE 02/11/18: She has been overall doing very well, there was no recurrent spells of MS exacerbation, tolerating Gilenya, is seen by her primary care physician on monthly basis, she continue have spells of anxiety, taking prozac 20 mg every day, she takes Xanax 0.25 mg twice a day as needed for anxiety and for insomnia,  UPDATE December 15 2019: Dalaina came into clinic today with her typical accident, reported that was brought on by stress, she denies any other flareup,  I have personally reviewed MRI of the brain with without contrast July 12, 2019, there are a few T2/flair hyperintensity foci in the pons, hemisphere, no change compared to previous MRI  MRI of cervical spine was normal, multilevel degenerative changes  Over the years, I have talked with her multiple times about stopping immunomodulation therapy because of her such benign clinical course and imaging findings, patient is very hesitate to stop Gilenya treatment,  Today she brought up the issue of COVID-19 vaccination while taking Gilenya, is willing to consider the possibility of stop immunomodulation therapy for a while, understanding if she has stopped Gilenya for more than 2 weeks, she needs to go through first dose observation again,  She also complains of frequent headaches, nausea since January 2021, she is under a lot of family stress, she complains of extreme smell sensitivity, even unfamiliar smell in her car will trigger her nausea, pressure headache on the left side, light sensitive, movement made it  worse, she performed lie down in dark quiet room resting for a while,  She also has a history of fatigue, taking Provigil, Chronic pain, taking Lyrica, meloxicam She has worsening depression, taking Prozac, Cymbalta  Update June 16, 2020 SS: When last seen, started Topamax 100 mg at bedtime, Imitrex as needed for migraine.  We are prescribing baclofen, Prozac, Gilenya, Provigil, Zofran.  Is on chronic opioids, Percocet, MS Contin for chronic back pain and fibromyalgia from PCP.  Decided to remain on Gilenya, feels has kept stable over the years, afraid more lesions will come.  Migraines improved.  She is using a cane, drives a car.  No change in MS status.  Provigil 200 mg daily, stimulates her too much.  She is overall well, walking more forward leaning today, still with accent.  CMP from PCP in August was unremarkable. Will get CBC today.   Update December 15, 2020 SS: Here today for follow-up unaccompanied, remains on Gilenya. MS stable. MRI brain and cervical spine in Oct 2020 were stable. Raining today, bothers her back and knees. Not falling. Synthroid has been increased, she had stopped by mistake, taking some time to get normalized, fatigue as result. Taking Provigil 100 mg at 6 AM, was previously on 200 mg, was having insomnia. Still has trouble sleeping, using CPAP. Headache doing well, no migraine in several months, remains on Topamax,  rarely takes Imitrex. On MS Contin, Percocet from PCP for chronic back pain. On Gilenya, Baclofen, Prozac, Topamax, Imitrex from this office.   CBC in September 2021 absolute lymphocyte count 0.4, WBC 5.8.  Recent labs from PCP on 11/09/20, CMP was normal, TSH 4.365, WBC 4.9, B12 464.  REVIEW OF SYSTEMS: Out of a complete 14 system review of symptoms, the patient complains only of the following symptoms, and all other reviewed systems are negative.  Chronic pain, headache  ALLERGIES: No Known Allergies  HOME MEDICATIONS: Outpatient Medications Prior  to Visit  Medication Sig Dispense Refill  . amLODipine (NORVASC) 10 MG tablet Take 1 tablet by mouth daily.  2  . cetirizine (ZYRTEC) 10 MG tablet Take 10 mg by mouth daily.    . Cholecalciferol (VITAMIN D) 2000 units CAPS Take 1 capsule by mouth daily.    Marland Kitchen docusate sodium (COLACE) 100 MG capsule Take 100 mg by mouth 2 (two) times daily.     . DULoxetine (CYMBALTA) 60 MG capsule Take 60 mg by mouth daily.    . ferrous sulfate 325 (65 FE) MG tablet Take 325 mg by mouth daily with breakfast.    . fluticasone (FLONASE) 50 MCG/ACT nasal spray Place 2 sprays into the nose daily.    . hydrochlorothiazide (HYDRODIURIL) 12.5 MG tablet TK 1 T PO D  2  . ketoconazole (NIZORAL) 2 % cream Apply 1 application topically as needed.     Marland Kitchen levothyroxine (SYNTHROID) 137 MCG tablet Take 150 mcg by mouth daily.    Marland Kitchen LYRICA 200 MG capsule TK ONE C PO HS  2  . meloxicam (MOBIC) 15 MG tablet Take 15 mg by mouth daily as needed for pain.    . modafinil (PROVIGIL) 100 MG tablet Take 1 tablet (100 mg total) by mouth daily. 30 tablet 5  . morphine (MS CONTIN) 60 MG 12 hr tablet Take 60 mg by mouth every 12 (twelve) hours.    . Multiple Vitamins-Minerals (MULTIVITAMIN PO) Take 1 tablet by mouth daily.     Marland Kitchen omeprazole (PRILOSEC) 40 MG capsule TK 1 C PO D    . ondansetron (ZOFRAN ODT) 4 MG disintegrating tablet Take 1 tablet (4 mg total) by mouth every 8 (eight) hours as needed. 20 tablet 6  . oxyCODONE-acetaminophen (PERCOCET) 7.5-325 MG per tablet Take 1 tablet by mouth 3 (three) times daily.    . polyethylene glycol (MIRALAX / GLYCOLAX) packet Take 17 g by mouth daily as needed.     . SUMAtriptan (IMITREX) 50 MG tablet Take 1 tablet (50 mg total) by mouth every 2 (two) hours as needed for migraine. May repeat in 2 hours if headache persists or recurs. 12 tablet 5  . baclofen (LIORESAL) 20 MG tablet TAKE 1 TABLET BY MOUTH THREE TIMES DAILY 90 tablet 11  . FLUoxetine (PROZAC) 40 MG capsule TAKE 1 CAPSULE(40 MG) BY MOUTH  DAILY 90 capsule 3  . GILENYA 0.5 MG CAPS TAKE 1 CAPSULE (0.5MG ) BY MOUTH DAILY 90 capsule 3  . topiramate (TOPAMAX) 100 MG tablet Take 1 tablet (100 mg total) by mouth at bedtime. 30 tablet 11   No facility-administered medications prior to visit.    PAST MEDICAL HISTORY: Past Medical History:  Diagnosis Date  . Acid reflux   . Diabetes mellitus without complication (HCC)    states she was "prediabetic" at one time  . Fibromyalgia   . MS (multiple sclerosis) (HCC)   . Sleep apnea   . Thyroid disease  PAST SURGICAL HISTORY: Past Surgical History:  Procedure Laterality Date  . ENDOMETRIAL BIOPSY    . HERNIA REPAIR      FAMILY HISTORY: Family History  Problem Relation Age of Onset  . Hypertension Mother   . Migraines Mother   . High blood pressure Father   . Lung cancer Father     SOCIAL HISTORY: Social History   Socioeconomic History  . Marital status: Divorced    Spouse name: Not on file  . Number of children: 1  . Years of education: college  . Highest education level: Not on file  Occupational History    Employer: UNEMPLOYED    Comment: disabled  Tobacco Use  . Smoking status: Former Smoker    Quit date: 10/01/1988    Years since quitting: 32.2  . Smokeless tobacco: Never Used  Vaping Use  . Vaping Use: Never used  Substance and Sexual Activity  . Alcohol use: No    Alcohol/week: 0.0 standard drinks    Comment: Quit alcohol consumption in 1990's  . Drug use: No  . Sexual activity: Not on file  Other Topics Concern  . Not on file  Social History Narrative   She lives with her mother, daughter, she is on disability.    Right handed.   Education college.   Caffeine soda 2 daily   Social Determinants of Health   Financial Resource Strain: Not on file  Food Insecurity: Not on file  Transportation Needs: Not on file  Physical Activity: Not on file  Stress: Not on file  Social Connections: Not on file  Intimate Partner Violence: Not on file    PHYSICAL EXAM  Vitals:   12/15/20 1044  BP: 101/62  Pulse: 84  Weight: 276 lb (125.2 kg)  Height: 5\' 3"  (1.6 m)   Body mass index is 48.89 kg/m.  Generalized: Well developed, in no acute distress  Neurological examination  Mentation: Alert oriented to time, place, history taking. Follows all commands speech and language fluent with accent Cranial nerve II-XII: Pupils were equal round reactive to light. Extraocular movements were full, visual field were full on confrontational test. Facial sensation and strength were normal.  Head turning and shoulder shrug were normal and symmetric. Motor: The motor testing reveals 5 over 5 strength of all 4 extremities. Good symmetric motor tone is noted throughout.  Sensory: Sensory testing is intact to soft touch on all 4 extremities. No evidence of extinction is noted.  Coordination: Cerebellar testing reveals good finger-nose-finger and heel-to-shin bilaterally.  Gait and station: Gait is normal, but forward leaning today, slight limp on the right, using single-point cane Reflexes: Deep tendon reflexes are symmetric and normal bilaterally.   DIAGNOSTIC DATA (LABS, IMAGING, TESTING) - I reviewed patient records, labs, notes, testing and imaging myself where available.  Lab Results  Component Value Date   WBC 5.8 06/16/2020   HGB 11.3 06/16/2020   HCT 35.8 06/16/2020   MCV 84 06/16/2020   PLT 243 06/16/2020      Component Value Date/Time   NA 139 11/22/2019 1904   NA 141 06/15/2019 1010   K 3.4 (L) 11/22/2019 1904   CL 100 11/22/2019 1904   CO2 29 11/22/2019 1904   GLUCOSE 116 (H) 11/22/2019 1904   BUN 8 11/22/2019 1904   BUN 11 06/15/2019 1010   CREATININE 0.99 11/22/2019 1904   CALCIUM 8.6 (L) 11/22/2019 1904   PROT 6.9 06/15/2019 1010   ALBUMIN 4.3 06/15/2019 1010   AST 24 06/15/2019  1010   ALT 12 06/15/2019 1010   ALKPHOS 111 06/15/2019 1010   BILITOT 0.5 06/15/2019 1010   GFRNONAA >60 11/22/2019 1904   GFRAA >60  11/22/2019 1904   No results found for: CHOL, HDL, LDLCALC, LDLDIRECT, TRIG, CHOLHDL Lab Results  Component Value Date   HGBA1C 5.5 03/16/2013   No results found for: VITAMINB12 Lab Results  Component Value Date   TSH 0.138 (L) 03/16/2013   ASSESSMENT AND PLAN 50 y.o. year old female  has a past medical history of Acid reflux, Diabetes mellitus without complication (HCC), Fibromyalgia, MS (multiple sclerosis) (HCC), Sleep apnea, and Thyroid disease. here with:  1.  [name] -Presented with strange accent -Multiple repeat MRI of the brain showed stable to nonenhancing T2/flair lesions, MRI of cervical spine was normal In October 2020 -On Gilenya since August 2011, chosen to remain on immunomodulation therapy, despite stable clinical and imaging course -Is stable, will continue Gilenya, recommend annual eye exam  -Check CBC with diff today, reviewed CMP was okay -We have recommended the COVID vaccine, referred her to the MS Society  -Consider MRI of the brain at next visit, in 6 months with Dr. [name]   2.  Chronic migraine headaches -Currently well controlled  -Continue Topamax 100 mg at bedtime -Continue Imitrex as needed, may combine with Zofran  3.  Chronic fatigue -Stop the Provigil, unsure benefit, also reports insomnia, Provigil may be contributing, on CPAP -Also hypothyroidism may be playing a role, obesity, chronic pain, sedentary life style   4.  Depression, anxiety, chronic pain -Is on polypharmacy treatment, MS Contin, Cymbalta, Prozac, baclofen  I spent 30 minutes of face-to-face and non-face-to-face time with patient.  This included previsit chart review, lab review, study review, order entry, electronic health record documentation, patient education.  Margie Ege, AGNP-C, DNP 12/15/2020, 1:31 PM [facility] [address], [address] [place], [place] [address] [phone]

## 2020-12-15 ENCOUNTER — Encounter: Payer: Self-pay | Admitting: Neurology

## 2020-12-15 ENCOUNTER — Other Ambulatory Visit: Payer: Self-pay

## 2020-12-15 ENCOUNTER — Ambulatory Visit: Payer: Medicare HMO | Admitting: Neurology

## 2020-12-15 VITALS — BP 101/62 | HR 84 | Ht 63.0 in | Wt 276.0 lb

## 2020-12-15 DIAGNOSIS — G35 Multiple sclerosis: Secondary | ICD-10-CM

## 2020-12-15 DIAGNOSIS — L408 Other psoriasis: Secondary | ICD-10-CM | POA: Diagnosis not present

## 2020-12-15 DIAGNOSIS — G43709 Chronic migraine without aura, not intractable, without status migrainosus: Secondary | ICD-10-CM | POA: Diagnosis not present

## 2020-12-15 MED ORDER — TOPIRAMATE 100 MG PO TABS: 100.0000 mg | ORAL_TABLET | Freq: Every evening | ORAL | 3 refills | Status: DC

## 2020-12-15 MED ORDER — GILENYA 0.5 MG PO CAPS: ORAL_CAPSULE | ORAL | 3 refills | Status: DC

## 2020-12-15 MED ORDER — FLUOXETINE HCL 40 MG PO CAPS: ORAL_CAPSULE | ORAL | 3 refills | Status: DC

## 2020-12-15 MED ORDER — BACLOFEN 20 MG PO TABS: 20.0000 mg | ORAL_TABLET | Freq: Three times a day (TID) | ORAL | 3 refills | Status: DC

## 2020-12-15 NOTE — Patient Instructions (Signed)
Let's try to stop the Provigil to see if you sleep better  Continue all other medications Check CBC See you back in 6 months

## 2020-12-16 LAB — CBC WITH DIFFERENTIAL/PLATELET
Basophils Absolute: 0 10*3/uL (ref 0.0–0.2)
Basos: 0 %
EOS (ABSOLUTE): 0 10*3/uL (ref 0.0–0.4)
Eos: 0 %
Hematocrit: 40.6 % (ref 34.0–46.6)
Hemoglobin: 12.8 g/dL (ref 11.1–15.9)
Immature Grans (Abs): 0 10*3/uL (ref 0.0–0.1)
Immature Granulocytes: 0 %
Lymphocytes Absolute: 0.5 10*3/uL — ABNORMAL LOW (ref 0.7–3.1)
Lymphs: 9 %
MCH: 26.6 pg (ref 26.6–33.0)
MCHC: 31.5 g/dL (ref 31.5–35.7)
MCV: 84 fL (ref 79–97)
Monocytes Absolute: 0.5 10*3/uL (ref 0.1–0.9)
Monocytes: 10 %
Neutrophils Absolute: 4.1 10*3/uL (ref 1.4–7.0)
Neutrophils: 81 %
Platelets: 254 10*3/uL (ref 150–450)
RBC: 4.81 x10E6/uL (ref 3.77–5.28)
RDW: 14.1 % (ref 11.7–15.4)
WBC: 5.1 10*3/uL (ref 3.4–10.8)

## 2020-12-19 ENCOUNTER — Telehealth: Payer: Self-pay

## 2020-12-19 NOTE — Telephone Encounter (Signed)
-----   Message from Glean Salvo, NP sent at 12/19/2020  5:57 AM EDT ----- Sent my chart message: [name],  Blood work shows expected low absolute lymphocyte count 0.5 on Gilenya.

## 2021-02-20 ENCOUNTER — Telehealth: Payer: Self-pay | Admitting: *Deleted

## 2021-02-20 NOTE — Telephone Encounter (Signed)
Events noted, were reported absolute lymphocyte count 6.0 % May 2022. Difference in lab testing, were 0.5 in March on Gilenya. Will follow overtime.

## 2021-02-20 NOTE — Telephone Encounter (Signed)
Received CBC results from PCP, [facility]. placed on Maralyn Sago, NP's desk for review.

## 2021-03-15 IMAGING — CT CT HEAD W/O CM
3 series · 15 of 47 positions shown, 18 images · non-contrast
Comparison: 08/31/2017

CLINICAL DATA: Left-sided facial pain since this morning, decreased
facial sensation

EXAM:
CT HEAD WITHOUT CONTRAST
TECHNIQUE: Contiguous axial images were obtained from the base of the skull
through the vertex without intravenous contrast.

[Series 2: head wo · axial · 0.44mm/px · z∈[-34,+101]mm · 9 of 33 slices shown, 12 images]
[im 3/33  brain]
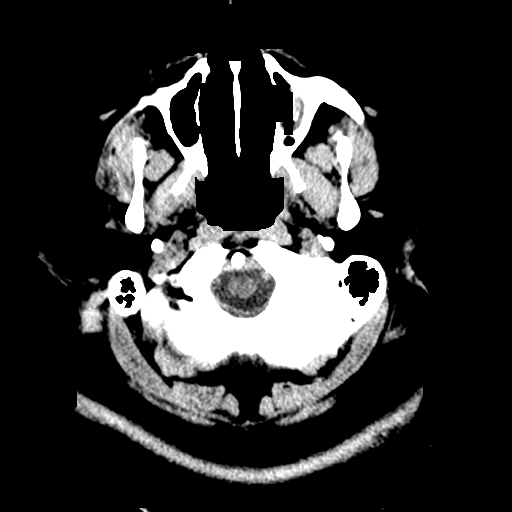
[im 3/33  bone]
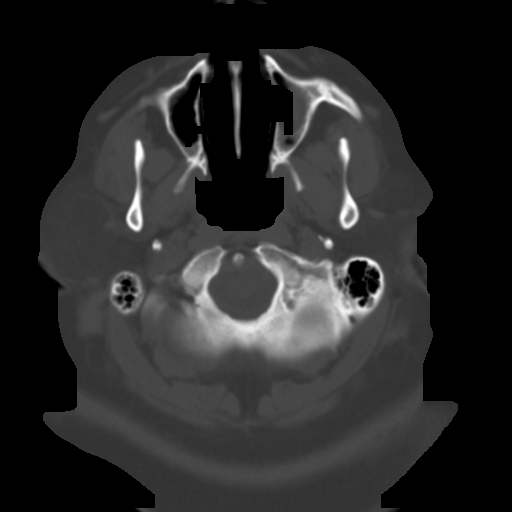
[im 6/33  brain]
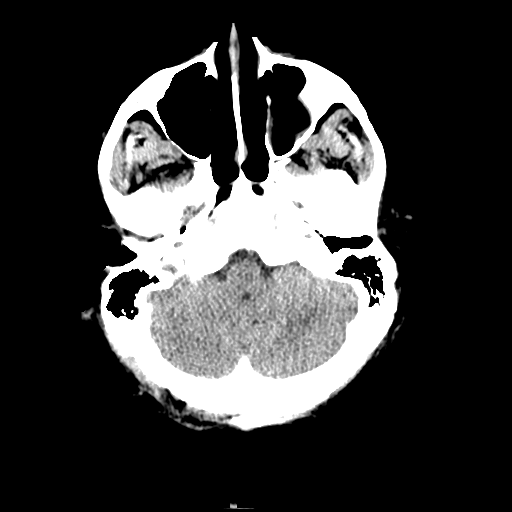
[im 9/33  brain]
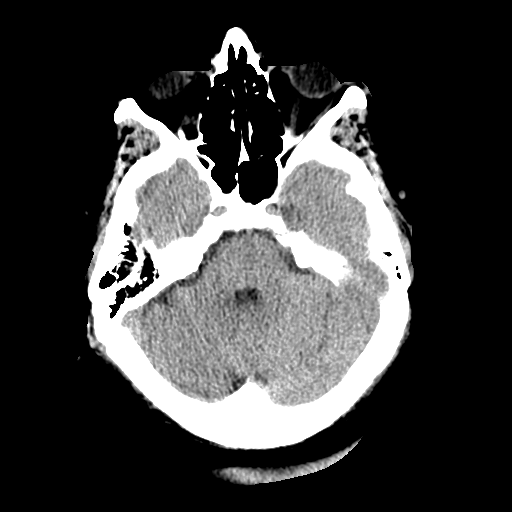
[im 13/33  brain]
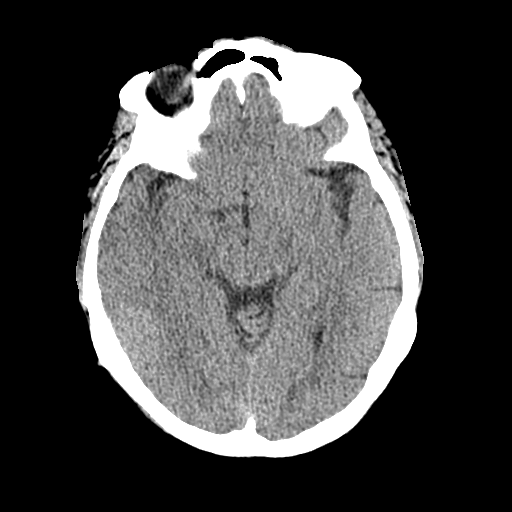
[im 17/33  brain]
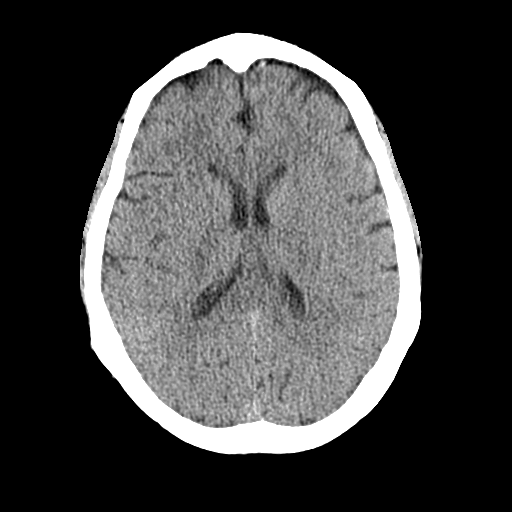
[im 17/33  bone]
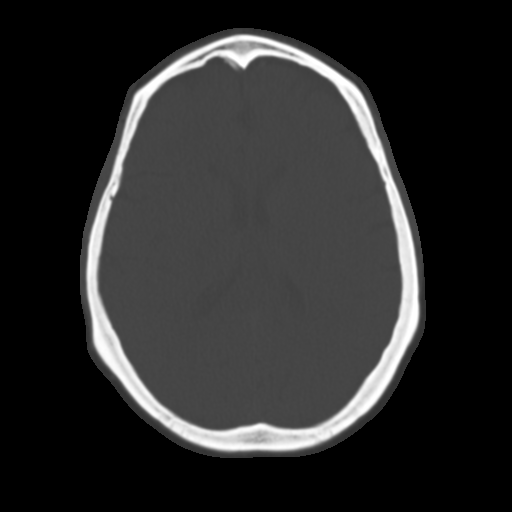
[im 20/33  brain]
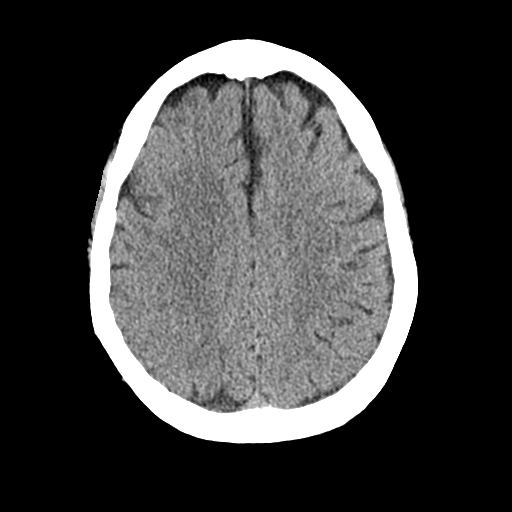
[im 24/33  brain]
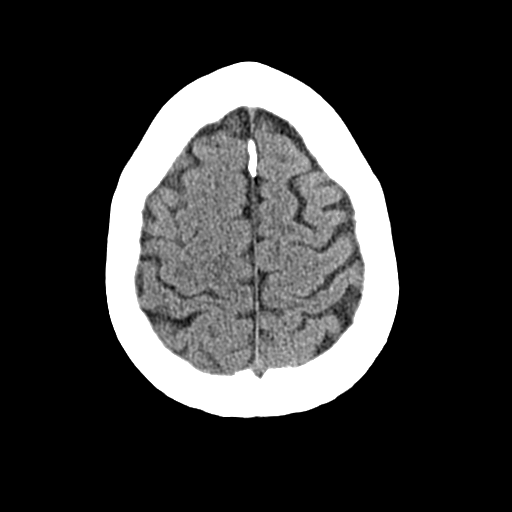
[im 27/33  brain]
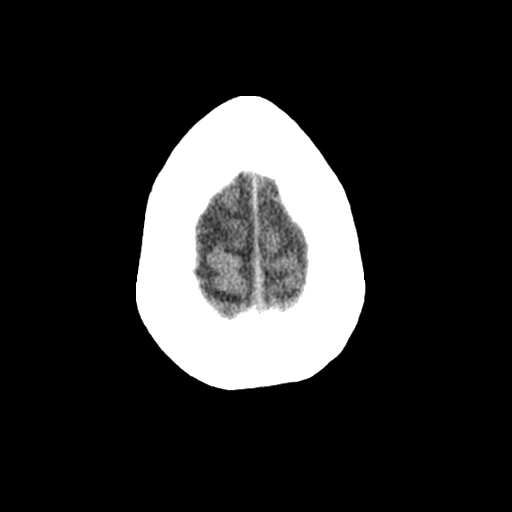
[im 30/33  brain]
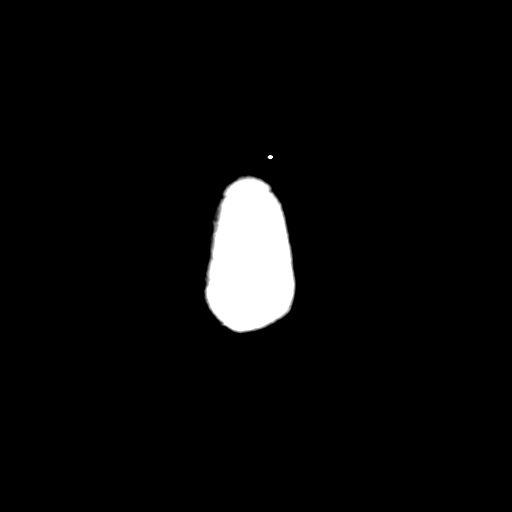
[im 30/33  bone]
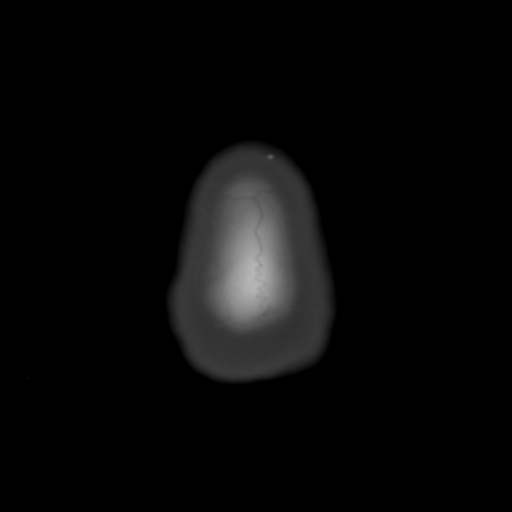

[Series 4: cor head wo · coronal · 0.33mm/px · 3 of 80 slices shown]
[im 27/80  brain]
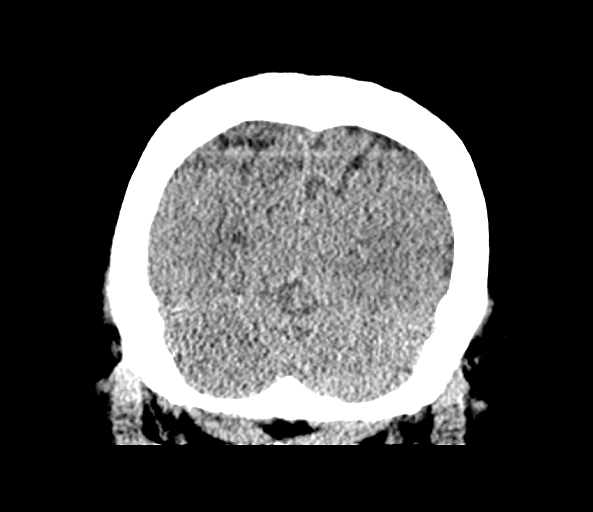
[im 36/80  brain]
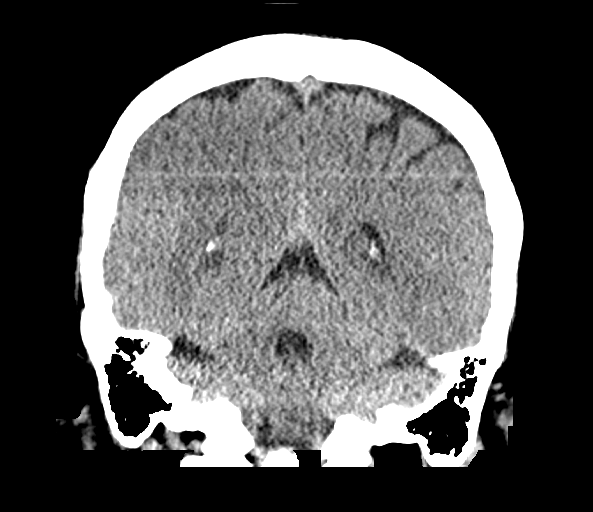
[im 44/80  brain]
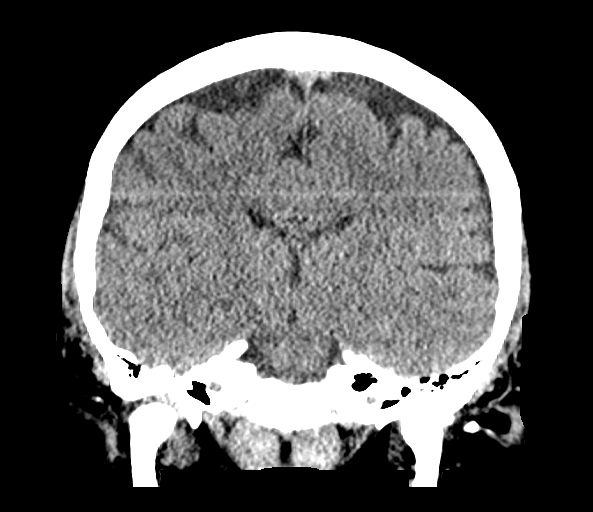

[Series 5: sag head wo · sagittal · 0.33mm/px · 3 of 62 slices shown]
[im 21/62  brain]
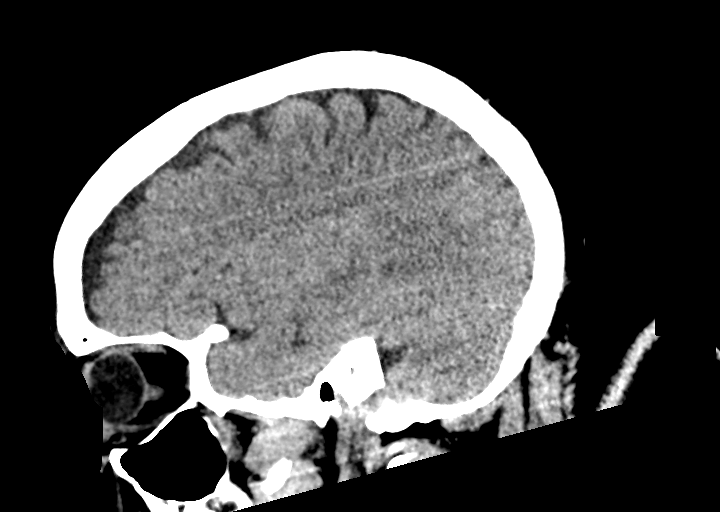
[im 31/62  brain]
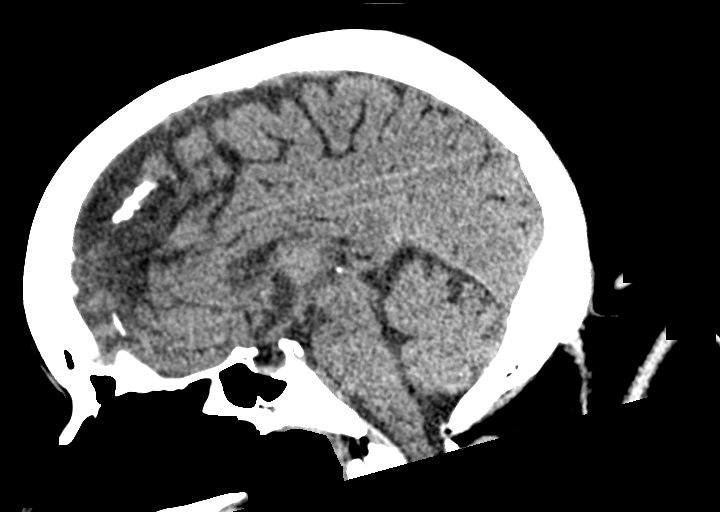
[im 41/62  brain]
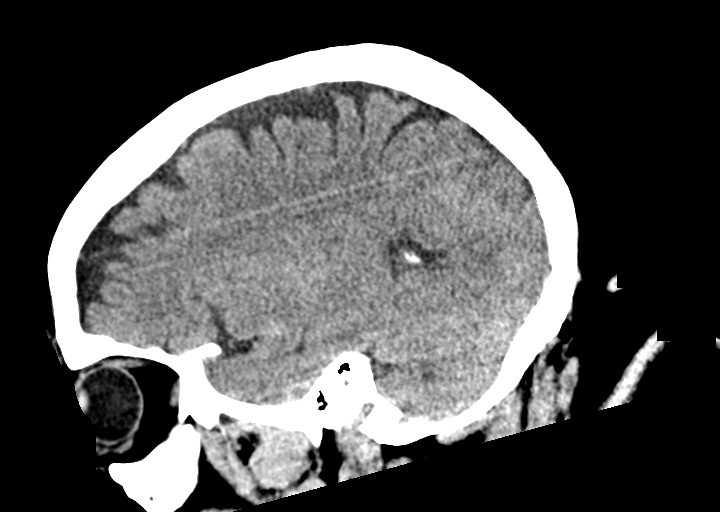

[15 of 47 positions shown; findings below may reference images not displayed]

FINDINGS: Brain: No acute infarct or hemorrhage. Lateral ventricles and
midline structures are unremarkable. No acute extra-axial fluid
collections. No mass effect.

Vascular: No hyperdense vessel or unexpected calcification.

Skull: Normal. Negative for fracture or focal lesion.

Sinuses/Orbits: Mucosal thickening is seen within the left maxillary
sinus. Remaining sinuses are clear.

Other: None
IMPRESSION: 1. Left maxillary sinus disease.
2. No acute intracranial process.

## 2021-03-15 IMAGING — CT CT MAXILLOFACIAL W/ CM
3 series · 16 of 47 positions shown, 19 images · IV contrast (omnipaque)
Comparison: 09/19/2017

CLINICAL DATA: Left-sided facial pain since this morning, decreased
sensation left-sided face, dental pain, history of MS

EXAM:
CT MAXILLOFACIAL WITH CONTRAST
TECHNIQUE: Multidetector CT imaging of the maxillofacial structures was
performed with intravenous contrast. Multiplanar CT image
reconstructions were also generated.
CONTRAST:  75mL OMNIPAQUE IOHEXOL 300 MG/ML  SOLN

[Series 4: max soft · axial · 0.37mm/px · z∈[-122,+22]mm · 10 of 84 slices shown, 13 images]
[im 6/84  brain]
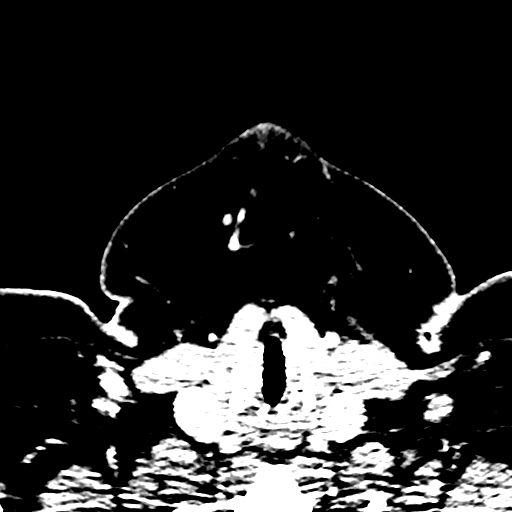
[im 6/84  bone]
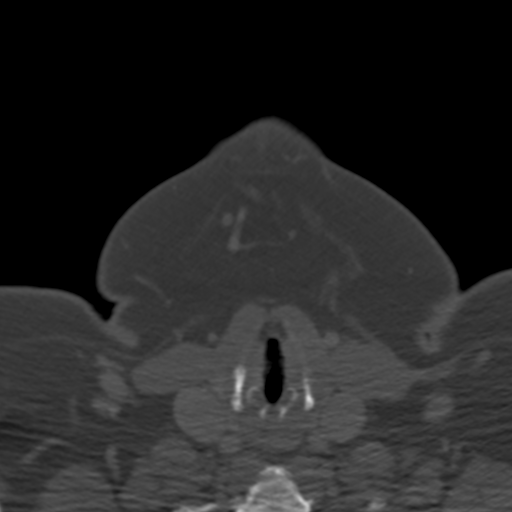
[im 15/84  bone]
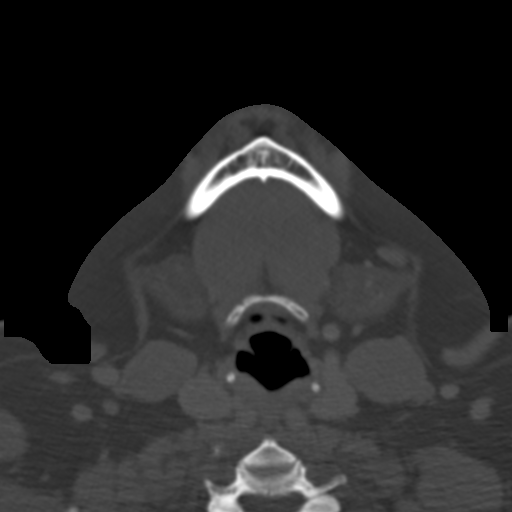
[im 23/84  bone]
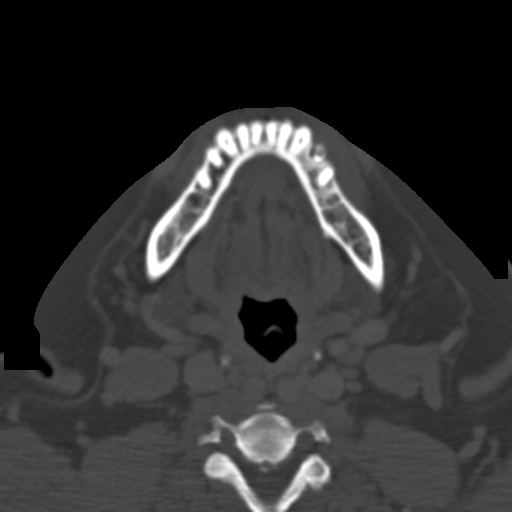
[im 29/84  bone]
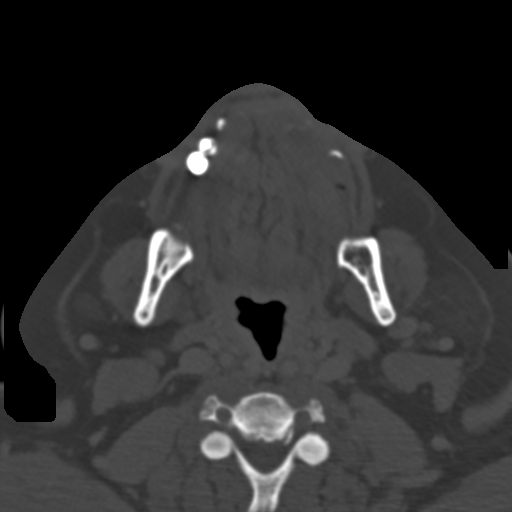
[im 38/84  brain]
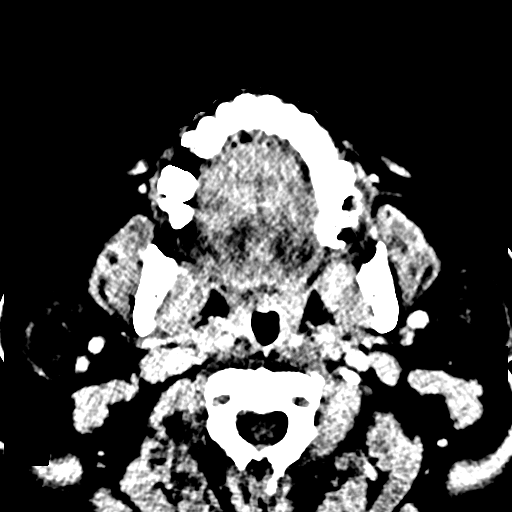
[im 38/84  bone]
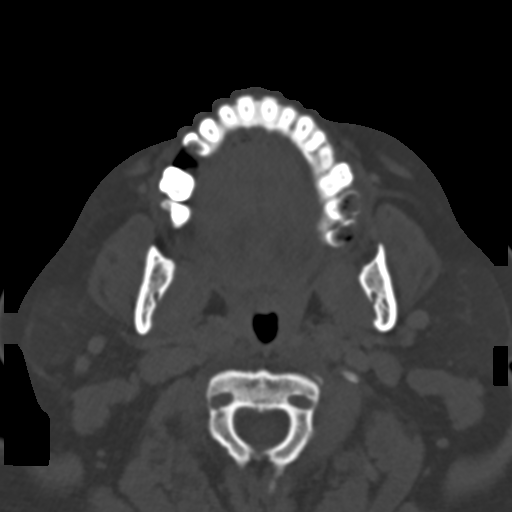
[im 46/84  bone]
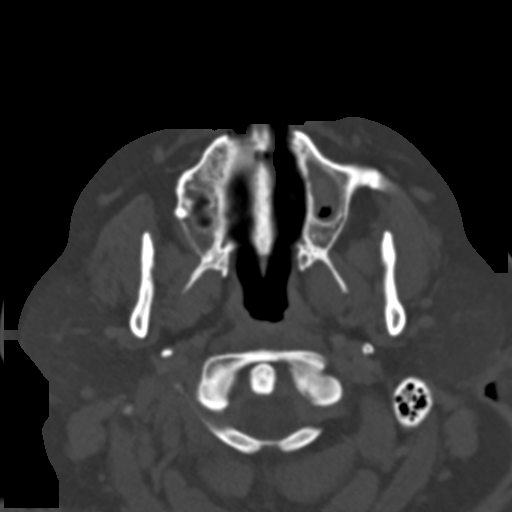
[im 55/84  bone]
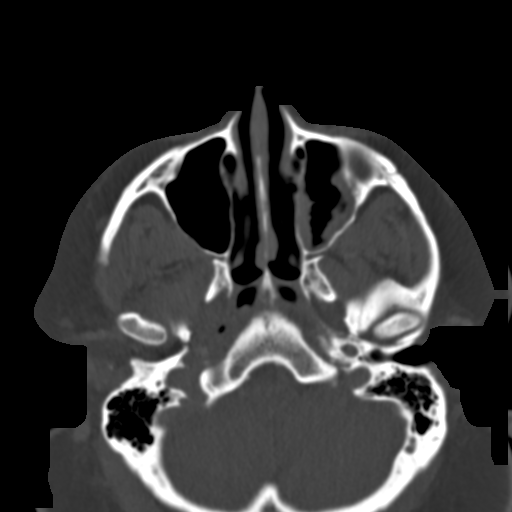
[im 63/84  bone]
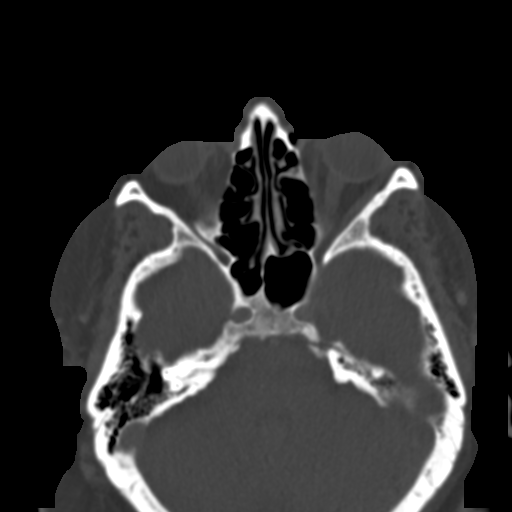
[im 69/84  brain]
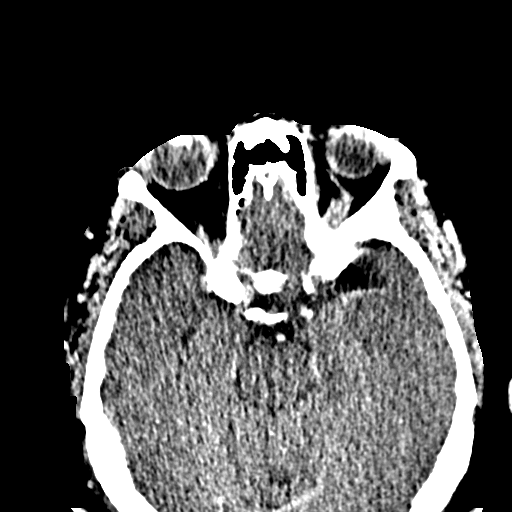
[im 69/84  bone]
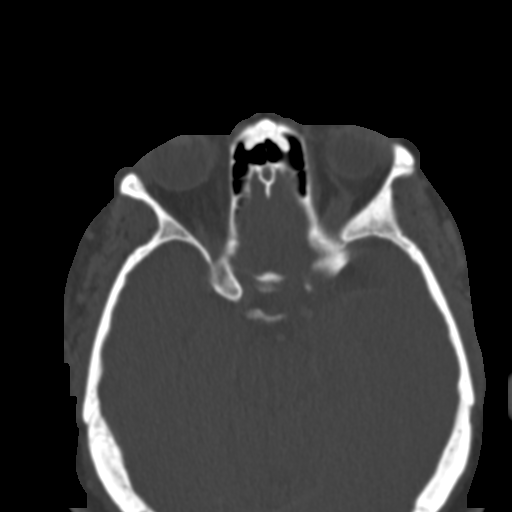
[im 78/84  bone]
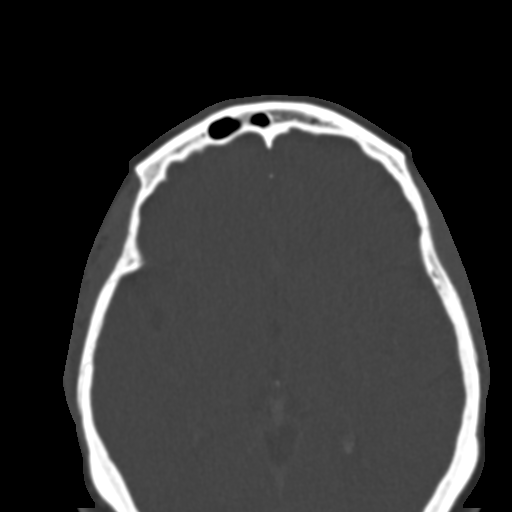

[Series 8: coronal soft · coronal · 0.38mm/px · 3 of 80 slices shown]
[im 27/80  bone]
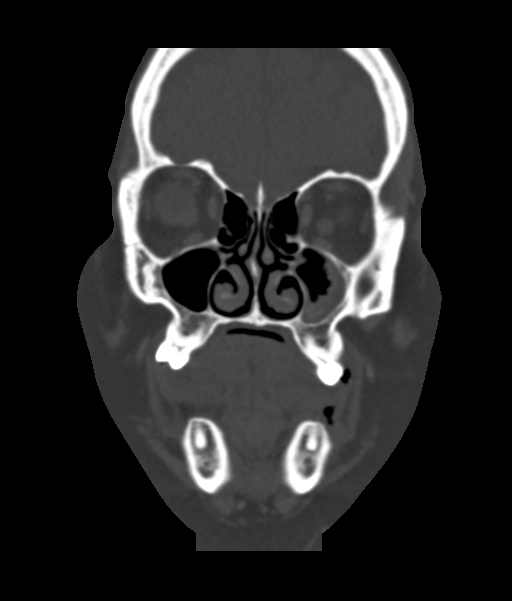
[im 36/80  bone]
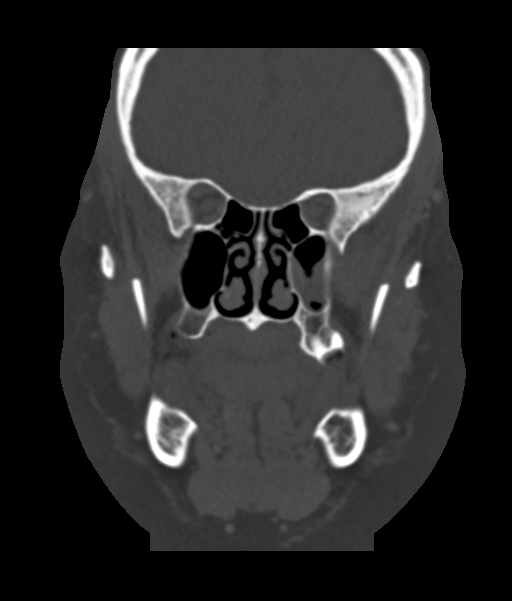
[im 44/80  bone]
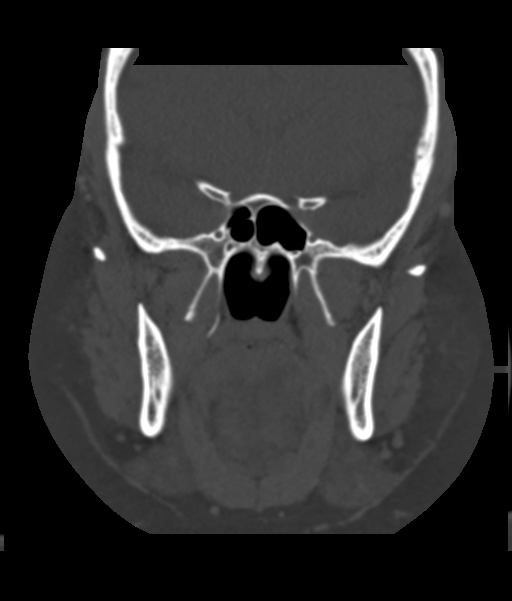

[Series 10: sagittal soft · sagittal · 0.34mm/px · 3 of 101 slices shown]
[im 34/101  bone]
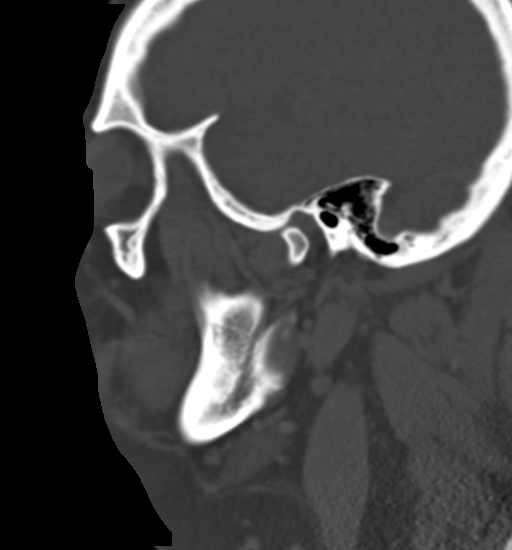
[im 51/101  bone]
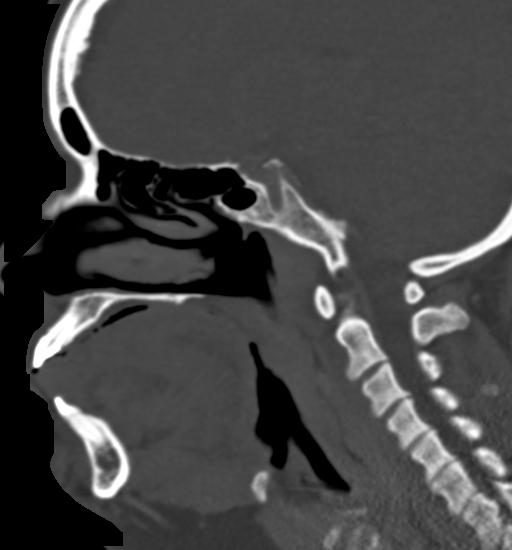
[im 67/101  bone]
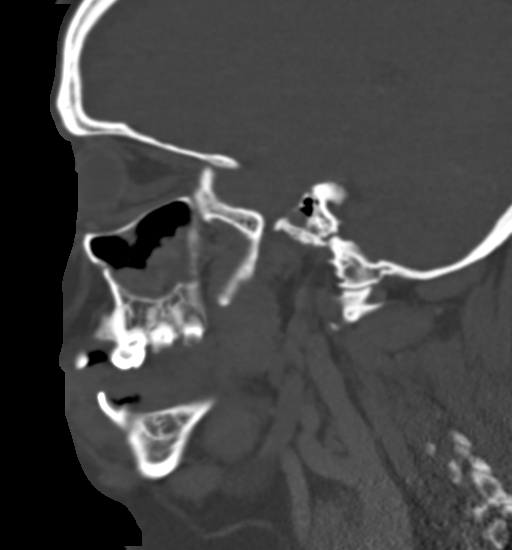

[16 of 47 positions shown; findings below may reference images not displayed]

FINDINGS: Osseous: There are no acute or destructive bony lesions.

There is poor dentition, with dental caries seen within the left
inferior bicuspid and premolar as well as the bilateral superior
molars.

Orbits: Negative. No traumatic or inflammatory finding.

Sinuses: There is mucosal thickening within the alveolar recess of
the left maxillary sinus. No gas fluid level. Remaining sinuses are
clear.

Soft tissues: Negative.

Limited intracranial: No significant or unexpected finding.
IMPRESSION: 1. Poor dentition with multiple dental caries as above.
2. Left maxillary sinus disease.
3. Otherwise unremarkable exam.

## 2021-06-29 ENCOUNTER — Telehealth: Payer: Medicare HMO | Admitting: Neurology

## 2021-06-29 ENCOUNTER — Telehealth (INDEPENDENT_AMBULATORY_CARE_PROVIDER_SITE_OTHER): Payer: Medicare HMO | Admitting: Neurology

## 2021-06-29 DIAGNOSIS — G43709 Chronic migraine without aura, not intractable, without status migrainosus: Secondary | ICD-10-CM

## 2021-06-29 DIAGNOSIS — E559 Vitamin D deficiency, unspecified: Secondary | ICD-10-CM

## 2021-06-29 DIAGNOSIS — G35 Multiple sclerosis: Secondary | ICD-10-CM

## 2021-06-29 MED ORDER — TOPIRAMATE 100 MG PO TABS: 100.0000 mg | ORAL_TABLET | Freq: Every evening | ORAL | 3 refills | Status: DC

## 2021-06-29 MED ORDER — SUMATRIPTAN SUCCINATE 50 MG PO TABS: 50.0000 mg | ORAL_TABLET | ORAL | 5 refills | Status: DC | PRN

## 2021-06-29 MED ORDER — ONDANSETRON 4 MG PO TBDP: 4.0000 mg | ORAL_TABLET | Freq: Three times a day (TID) | ORAL | 6 refills | Status: AC | PRN

## 2021-06-29 NOTE — Progress Notes (Addendum)
ASSESSMENT AND PLAN 50 y.o. year old female  [name] -Multiple repeat MRI of the brain showed stable to nonenhancing T2/flair lesions, MRI of cervical spine was normal In October 2020 -On Gilenya since August 2011, chosen to remain on immunomodulation therapy, despite stable clinical and imaging course, multiple failed attempts to persuade patient to give a trial of no immunomodulation therapy,    Chronic migraine headaches Topamax 100 mg at bedtime -Continue Imitrex as needed, may combine with Zofran  Depression, anxiety, chronic pain -Is on polypharmacy treatment, MS Contin, Cymbalta, Prozac, baclofen  Return to clinic in 1 year with nurse practitioner  DIAGNOSTIC DATA (LABS, IMAGING, TESTING) - I reviewed patient records, labs, notes, testing and imaging myself where available. MRI of the brain with without contrast October 2020: Few scattered T2/FLAIR hyperintensity, no acute abnormality  MRI of cervical spine with without contrast, no intrinsic cord signal abnormality  Laboratory evaluations, normal CBC, in March 2022 with exception of decreased absolute lymphocyte, consistent with her Gilenya use  HISTORY OF PRESENT ILLNESS:  [name] is a 50 year-old right-handed [place] American single female from [place], [place], she was diagnosed with multiple sclerosis 10/2004 with positive MRI of the brain and 6 oligoclonal bands in CSF. She was patient of Dr. Sandria Manly. She was begun on Avonex. 03/12/2006 she had new cervical medullary lesion and was changed to rebif. She is in the EPOC study by Dr. Jodi Mourning, has been treated with Gilenya since 05/24/10. She is not having any side effects from the medication. She had a normal examination at the [facility] specialists clinic checking for macular edema on 08/17/10.     06/25/11 MRI of the brain and cervical spine showed a few scattered supratentorial nonspecific foci of T2 hyperintensity without enhancement  and no change versus 04/19/09. There was disc bulging at C5-6 and C6-7 without cord lesions present and no change versus 06/16/08.    She also has hypothyroidism, diabetes, obstructive sleep apnea She is using CPAP At 13 cm of water with ESS 3. She had pulmonary function tests because of decreased breath sounds which were normal. 07/08/2012 she awoke with nausea and shaking of her right hand and arm, and inability to speak. Her right leg "did not want to go". The following day her speech was hesitant but well-formed. She was treated by iv followed by po steroid.     UPDATE June 5th 2014:   Since her last visit in May 20 third, she had multiple phone call to our office, with constellation of complains, including increased bilateral lower extremity tremor, gait difficulty, continued language difficulty, she is taking baclofen 20 mg 3 times a day, Xanax 0. 2 5 mg twice a day per instruction of on-call physician.     UPDATE July 7th 2014  She presented with progressive weakness of left lower extremity and difficulty with walking as well as progression of tremor affecting right extremities more than left extremities and also affecting her speech. She was admitted for a course of Solu-Medrol intravenously, 500 mg every 12 hours x6 from June 16th to June 18th, which has been very helpful. She wants home physical therapy MRI of brain showed few periventricular and subcortical T2 hyperintensities. Some of these are hypointense on T1 views. No abnormal lesions are seen on post contrast views     She was seen by [name] in March 3rd, reported that  In February 28 she awakened with a numbness in her right leg and right arm  and weakness having trouble with her ambulation. She is having problems with her speech.  She received five days of iv solumedrol, which did help her symptoms moderately, but she complains of upset stomach, loss of appetite, elevated glucose, mother complains of labile emotion   UPDATE Sep 13 2015: She presented with increased right side weakness in early Dec 2016, she was admitted to hospital in September 08 2015, there was no evidence of urinary tract infection, she was given a dose of Solu-Medrol, which did help her symptoms some   I have reviewed MRI brain: No acute infarct or evidence of active demyelination. Multiple sclerosis with single new right frontal plaque compared to brain MRI 03/04/2014.   MRI cervical Moderately motion degraded examination. No definite spinal cord lesion identified.  Mild disc and facet degeneration     I reviewed laboratory evaluation September 08 2015 showed normal CBC, Hg 11.3   Update November 26 2016: She is doing well there was no recurrent multiple sclerosis flareup, we have personally reviewed MRI of the brain and cervical spine in November 2017, mild multilevel cervical degenerative changes, no canal stenosis, no cervical spinal cord signal abnormality.   MRI of the brain showed scattered T2/FLAIR hyperintensity signal at juxtacortical, periventricular deep white matters, no contrast enhancement no change compared to previous scan in December 2016. She complains of gait abnormality, low back pain, right hip pain, is taking chronic narcotics pain medications.    fatigue, taking provigil 200 mg daily    Reviewed laboratory evaluation on October 08 2016, CBC showed no significant abnormality. Normal CMP   MCH - 25 L MCHC - 30.2 L MPV - 10.6 H NE% - 81.4 H LY% - 6.6 L   UPDATE 02/11/18: She has been overall doing very well, there was no recurrent spells of MS exacerbation, tolerating Gilenya, is seen by her primary care physician on monthly basis, she continue have spells of anxiety, taking prozac 20 mg every day, she takes Xanax 0.25 mg twice a day as needed for anxiety and for insomnia,   UPDATE December 15 2019: [name] came into clinic today with her typical accident, reported that was brought on by stress, she denies any other flareup,   I  have personally reviewed MRI of the brain with without contrast July 12, 2019, there are a few T2/flair hyperintensity foci in the pons, hemisphere, no change compared to previous MRI   MRI of cervical spine was normal, multilevel degenerative changes   Over the years, I have talked with her multiple times about stopping immunomodulation therapy because of her such benign clinical course and imaging findings, patient is very hesitate to stop Gilenya treatment,   Today she brought up the issue of COVID-19 vaccination while taking Gilenya, is willing to consider the possibility of stop immunomodulation therapy for a while, understanding if she has stopped Gilenya for more than 2 weeks, she needs to go through first dose observation again,   She also complains of frequent headaches, nausea since January 2021, she is under a lot of family stress, she complains of extreme smell sensitivity, even unfamiliar smell in her car will trigger her nausea, pressure headache on the left side, light sensitive, movement made it worse, she performed lie down in dark quiet room resting for a while,   She also has a history of fatigue, taking Provigil, Chronic pain, taking Lyrica, meloxicam She has worsening depression, taking Prozac, Cymbalta   Virtual Visit via video I connected with Sandrina L  White-Astucci  on September 29 by a video enabled telemedicine application and verified that I am speaking with the correct person using two identifiers Location: Provider [facility] office, patient at home.  She is overall stable, no flareup of MS symptoms, we again discussed the possibility of starting long-term immunomodulation therapy, due to her stable symptoms, minimum findings on multiple repeat MRI of the brain with without contrast, I personally reviewed most recent MRI of the brain in October 2020, only few scattered T2/FLAIR hyperdensity, no significant change, MRI cervical spine showed no cord involvement, she is on  Gilenya,  Continue has migraine headache couple times each week, responding well to Topamax every night as preventive medication, Imitrex 50 mg as needed, continue have gait abnormality, also limited by her big body habitus, chronic low back pain,   Physical examinations: Awake, alert, oriented to history taking and casual conversation, no dysarthria, no aphasia, facial symmetric, moving 4 extremities without difficulties   REVIEW OF SYSTEMS: Out of a complete 14 system review of symptoms, the patient complains only of the following symptoms, and all other reviewed systems are negative.  Chronic pain, headache  ALLERGIES: No Known Allergies  HOME MEDICATIONS: Outpatient Medications Prior to Visit  Medication Sig Dispense Refill   amLODipine (NORVASC) 10 MG tablet Take 1 tablet by mouth daily.  2   baclofen (LIORESAL) 20 MG tablet Take 1 tablet (20 mg total) by mouth 3 (three) times daily. 270 tablet 3   cetirizine (ZYRTEC) 10 MG tablet Take 10 mg by mouth daily.     Cholecalciferol (VITAMIN D) 2000 units CAPS Take 1 capsule by mouth daily.     docusate sodium (COLACE) 100 MG capsule Take 100 mg by mouth 2 (two) times daily.      DULoxetine (CYMBALTA) 60 MG capsule Take 60 mg by mouth daily.     ferrous sulfate 325 (65 FE) MG tablet Take 325 mg by mouth daily with breakfast.     Fingolimod HCl (GILENYA) 0.5 MG CAPS TAKE 1 CAPSULE (0.5MG ) BY MOUTH DAILY 90 capsule 3   FLUoxetine (PROZAC) 40 MG capsule TAKE 1 CAPSULE(40 MG) BY MOUTH DAILY 90 capsule 3   fluticasone (FLONASE) 50 MCG/ACT nasal spray Place 2 sprays into the nose daily.     hydrochlorothiazide (HYDRODIURIL) 12.5 MG tablet TK 1 T PO D  2   ketoconazole (NIZORAL) 2 % cream Apply 1 application topically as needed.      levothyroxine (SYNTHROID) 137 MCG tablet Take 150 mcg by mouth daily.     LYRICA 200 MG capsule TK ONE C PO HS  2   meloxicam (MOBIC) 15 MG tablet Take 15 mg by mouth daily as needed for pain.     modafinil  (PROVIGIL) 100 MG tablet Take 1 tablet (100 mg total) by mouth daily. 30 tablet 5   morphine (MS CONTIN) 60 MG 12 hr tablet Take 60 mg by mouth every 12 (twelve) hours.     Multiple Vitamins-Minerals (MULTIVITAMIN PO) Take 1 tablet by mouth daily.      omeprazole (PRILOSEC) 40 MG capsule TK 1 C PO D     ondansetron (ZOFRAN ODT) 4 MG disintegrating tablet Take 1 tablet (4 mg total) by mouth every 8 (eight) hours as needed. 20 tablet 6   oxyCODONE-acetaminophen (PERCOCET) 7.5-325 MG per tablet Take 1 tablet by mouth 3 (three) times daily.     polyethylene glycol (MIRALAX / GLYCOLAX) packet Take 17 g by mouth daily as needed.  SUMAtriptan (IMITREX) 50 MG tablet Take 1 tablet (50 mg total) by mouth every 2 (two) hours as needed for migraine. May repeat in 2 hours if headache persists or recurs. 12 tablet 5   topiramate (TOPAMAX) 100 MG tablet Take 1 tablet (100 mg total) by mouth at bedtime. 90 tablet 3   No facility-administered medications prior to visit.    PAST MEDICAL HISTORY: Past Medical History:  Diagnosis Date   Acid reflux    Diabetes mellitus without complication (HCC)    states she was "prediabetic" at one time   Fibromyalgia    MS (multiple sclerosis) (HCC)    Sleep apnea    Thyroid disease     PAST SURGICAL HISTORY: Past Surgical History:  Procedure Laterality Date   ENDOMETRIAL BIOPSY     HERNIA REPAIR      FAMILY HISTORY: Family History  Problem Relation Age of Onset   Hypertension Mother    Migraines Mother    High blood pressure Father    Lung cancer Father     SOCIAL HISTORY: Social History   Socioeconomic History   Marital status: Divorced    Spouse name: Not on file   Number of children: 1   Years of education: college   Highest education level: Not on file  Occupational History    Employer: UNEMPLOYED    Comment: disabled  Tobacco Use   Smoking status: Former    Types: Cigarettes    Quit date: 10/01/1988    Years since quitting: 32.7    Smokeless tobacco: Never  Vaping Use   Vaping Use: Never used  Substance and Sexual Activity   Alcohol use: No    Alcohol/week: 0.0 standard drinks    Comment: Quit alcohol consumption in 1990's   Drug use: No   Sexual activity: Not on file  Other Topics Concern   Not on file  Social History Narrative   She lives with her mother, daughter, she is on disability.    Right handed.   Education college.   Caffeine soda 2 daily   Social Determinants of Health   Financial Resource Strain: Not on file  Food Insecurity: Not on file  Transportation Needs: Not on file  Physical Activity: Not on file  Stress: Not on file  Social Connections: Not on file  Intimate Partner Violence: Not on file     Levert Feinstein, M.D. Ph.D.  [facility] [address] [place], [place] [address] Phone: [phone] Fax:      [phone]

## 2021-06-30 ENCOUNTER — Encounter: Payer: Self-pay | Admitting: Neurology

## 2021-12-20 ENCOUNTER — Other Ambulatory Visit: Payer: Self-pay | Admitting: *Deleted

## 2021-12-25 ENCOUNTER — Other Ambulatory Visit: Payer: Self-pay

## 2021-12-25 MED ORDER — FLUOXETINE HCL 40 MG PO CAPS: ORAL_CAPSULE | ORAL | 3 refills | Status: DC

## 2021-12-25 NOTE — Progress Notes (Signed)
Rx refilled as per last video visit note. ?

## 2022-01-01 ENCOUNTER — Other Ambulatory Visit: Payer: Self-pay | Admitting: Neurology

## 2022-01-01 DIAGNOSIS — G35 Multiple sclerosis: Secondary | ICD-10-CM

## 2022-01-01 DIAGNOSIS — L408 Other psoriasis: Secondary | ICD-10-CM

## 2022-01-01 NOTE — Telephone Encounter (Signed)
Rx refilled.

## 2022-01-02 ENCOUNTER — Telehealth: Payer: Self-pay | Admitting: Neurology

## 2022-01-02 NOTE — Telephone Encounter (Signed)
I called patient. I spoke with her and her mother. ? ?For the past week patient believes that she has been having an MS exacerbation. She complains of slow speech. She complains of leg pain and numbness. She is complaining that her arms are beginning to get weaker as well. She denies any s/s of active infection. She denies missing Gilenya doses. ? ?She is requesting steroids to help with this exacerbation. ? ?She is also complaining of a headache and vomiting. She will also try to discuss this with her PCP. ?

## 2022-01-02 NOTE — Telephone Encounter (Signed)
I called patient, spoke with patient's mother, [name], per [facility]. I discussed Dr. Zannie Cove recommendations with her. Patient will follow up with her PCP for further recommendations. ?

## 2022-01-02 NOTE — Telephone Encounter (Signed)
Pt's mother, Ethelene Browns (on [place]) she has had a MS relapse for a week.This morning throwing up a lot, having a really bad headache.  Would like a call from the nurse to discuss a work in appt. ?

## 2022-01-02 NOTE — Telephone Encounter (Addendum)
I know this patient well, minimal findings on MRI of brain, has variable effort on examination, reported speech difficulty, also variable. ? ?Steroid treatment would have significant side effect with her comorbidity of hypertension, diabetes, already on pain medications ? ?I would not treat her with steroid, agree with her contact primary care for GI symptoms ?

## 2022-01-03 ENCOUNTER — Other Ambulatory Visit: Payer: Self-pay

## 2022-01-03 ENCOUNTER — Emergency Department (HOSPITAL_BASED_OUTPATIENT_CLINIC_OR_DEPARTMENT_OTHER)
Admission: EM | Admit: 2022-01-03 | Discharge: 2022-01-03 | Disposition: A | Discharge status: Home / Self Care | Payer: Medicare HMO | Attending: Emergency Medicine | Admitting: Emergency Medicine

## 2022-01-03 ENCOUNTER — Emergency Department (HOSPITAL_BASED_OUTPATIENT_CLINIC_OR_DEPARTMENT_OTHER): Payer: Medicare HMO

## 2022-01-03 ENCOUNTER — Encounter (HOSPITAL_BASED_OUTPATIENT_CLINIC_OR_DEPARTMENT_OTHER): Payer: Self-pay

## 2022-01-03 DIAGNOSIS — E119 Type 2 diabetes mellitus without complications: Secondary | ICD-10-CM | POA: Insufficient documentation

## 2022-01-03 DIAGNOSIS — M79605 Pain in left leg: Secondary | ICD-10-CM | POA: Diagnosis present

## 2022-01-03 DIAGNOSIS — R710 Precipitous drop in hematocrit: Secondary | ICD-10-CM | POA: Insufficient documentation

## 2022-01-03 DIAGNOSIS — Z79899 Other long term (current) drug therapy: Secondary | ICD-10-CM | POA: Diagnosis not present

## 2022-01-03 DIAGNOSIS — L03116 Cellulitis of left lower limb: Secondary | ICD-10-CM | POA: Diagnosis not present

## 2022-01-03 DIAGNOSIS — R944 Abnormal results of kidney function studies: Secondary | ICD-10-CM | POA: Diagnosis not present

## 2022-01-03 LAB — CBC WITH DIFFERENTIAL/PLATELET
Abs Immature Granulocytes: 0.05 10*3/uL (ref 0.00–0.07)
Basophils Absolute: 0 10*3/uL (ref 0.0–0.1)
Basophils Relative: 0 %
Eosinophils Absolute: 0 10*3/uL (ref 0.0–0.5)
Eosinophils Relative: 0 %
HCT: 34.5 % — ABNORMAL LOW (ref 36.0–46.0)
Hemoglobin: 11.4 g/dL — ABNORMAL LOW (ref 12.0–15.0)
Immature Granulocytes: 1 %
Lymphocytes Relative: 3 %
Lymphs Abs: 0.3 10*3/uL — ABNORMAL LOW (ref 0.7–4.0)
MCH: 27.1 pg (ref 26.0–34.0)
MCHC: 33 g/dL (ref 30.0–36.0)
MCV: 81.9 fL (ref 80.0–100.0)
Monocytes Absolute: 0.8 10*3/uL (ref 0.1–1.0)
Monocytes Relative: 8 %
Neutro Abs: 9 10*3/uL — ABNORMAL HIGH (ref 1.7–7.7)
Neutrophils Relative %: 88 %
Platelets: 186 10*3/uL (ref 150–400)
RBC: 4.21 MIL/uL (ref 3.87–5.11)
RDW: 13.7 % (ref 11.5–15.5)
WBC: 10.2 10*3/uL (ref 4.0–10.5)
nRBC: 0 % (ref 0.0–0.2)

## 2022-01-03 LAB — BASIC METABOLIC PANEL
Anion gap: 10 (ref 5–15)
BUN: 13 mg/dL (ref 6–20)
CO2: 22 mmol/L (ref 22–32)
Calcium: 8 mg/dL — ABNORMAL LOW (ref 8.9–10.3)
Chloride: 103 mmol/L (ref 98–111)
Creatinine, Ser: 1.04 mg/dL — ABNORMAL HIGH (ref 0.44–1.00)
GFR, Estimated: >60 mL/min (ref >60)
Glucose, Bld: 104 mg/dL — ABNORMAL HIGH (ref 70–99)
Potassium: 3.1 mmol/L — ABNORMAL LOW (ref 3.5–5.1)
Sodium: 135 mmol/L (ref 135–145)

## 2022-01-03 MED ORDER — POTASSIUM CHLORIDE CRYS ER 20 MEQ PO TBCR
40.0000 meq | EXTENDED_RELEASE_TABLET | Freq: Once | ORAL | Status: AC
  Administered 2022-01-03: 40 meq via ORAL
  Filled 2022-01-03: qty 2

## 2022-01-03 MED ORDER — CEPHALEXIN 500 MG PO CAPS: 500.0000 mg | ORAL_CAPSULE | Freq: Four times a day (QID) | ORAL | 0 refills | Status: AC

## 2022-01-03 MED ORDER — CEPHALEXIN 250 MG PO CAPS
500.0000 mg | ORAL_CAPSULE | Freq: Once | ORAL | Status: AC
  Administered 2022-01-03: 500 mg via ORAL
  Filled 2022-01-03: qty 2

## 2022-01-03 NOTE — Discharge Instructions (Addendum)
Please pick up antibiotics and take as prescribed to cover for skin infection.  ? ?Follow-up with your PCP for further evaluation at your appointment on Monday.  ? ?Return to the ED for any new/worsening symptoms including worsening redness of your leg, worsening swelling, worsening pain, no improvement after 48 hours on antibiotics.  ?

## 2022-01-03 NOTE — ED Provider Notes (Signed)
?[facility] EMERGENCY DEPARTMENT ?Provider Note ? ? ?CSN: [id number] ?Arrival date & time: 01/03/22  1959 ? ?  ? ?History ? ?Chief Complaint  ?Patient presents with  ? Leg Pain  ? ? ?[name] is a 51 y.o. female with PMHx Diabetes, Fibromyalgia, MS who presents to the ED Today with complaint of gradual onset, constant, achy, left leg pain that began earlier today.  Patient states that she was laying in bed when she pulled her left leg over to the edge of the bed prompting to get up when she felt a sharp pain in her leg.  She states that since that time she has had pain and swelling.  Her daughter noticed that her leg was red and she reports increased warmth to same.  She states that this is new since today.  Denies any fevers or chills.  No history of cellulitis in the past.  Denies any chest pain or shortness of breath.  ? ?The history is provided by the patient, a relative and medical records.  ? ?  ? ?Home Medications ?Prior to Admission medications   ?Medication Sig Start Date End Date Taking? Authorizing Provider  ?cephALEXin (KEFLEX) 500 MG capsule Take 1 capsule (500 mg total) by mouth 4 (four) times daily for 7 days. 01/03/22 01/10/22 Yes Hiedi Touchton, PA-C  ?amLODipine (NORVASC) 10 MG tablet Take 1 tablet by mouth daily. 02/07/18   [provider]  ?baclofen (LIORESAL) 20 MG tablet Take 1 tablet (20 mg total) by mouth 3 (three) times daily. 12/15/20   Glean Salvo, NP  ?cetirizine (ZYRTEC) 10 MG tablet Take 10 mg by mouth daily.    [provider]  ?Cholecalciferol (VITAMIN D) 2000 units CAPS Take 1 capsule by mouth daily.    [provider]  ?docusate sodium (COLACE) 100 MG capsule Take 100 mg by mouth 2 (two) times daily.     [provider]  ?DULoxetine (CYMBALTA) 60 MG capsule Take 60 mg by mouth daily.    [provider]  ?ferrous sulfate 325 (65 FE) MG tablet Take 325 mg by mouth daily with breakfast.    [provider]   ?Fingolimod HCl 0.5 MG CAPS TAKE 1 CAPSULE BY MOUTH DAILY 01/01/22   Glean Salvo, NP  ?FLUoxetine (PROZAC) 40 MG capsule TAKE 1 CAPSULE(40 MG) BY MOUTH DAILY 12/25/21   Glean Salvo, NP  ?fluticasone (FLONASE) 50 MCG/ACT nasal spray Place 2 sprays into the nose daily.    [provider]  ?hydrochlorothiazide (HYDRODIURIL) 12.5 MG tablet TK 1 T PO D 01/10/17   [provider]  ?ketoconazole (NIZORAL) 2 % cream Apply 1 application topically as needed.  06/03/19   [provider]  ?levothyroxine (SYNTHROID) 137 MCG tablet Take 150 mcg by mouth daily. 06/02/20   [provider]  ?LYRICA 200 MG capsule TK ONE C PO HS 02/08/17   [provider]  ?meloxicam (MOBIC) 15 MG tablet Take 15 mg by mouth daily as needed for pain.    [provider]  ?morphine (MS CONTIN) 60 MG 12 hr tablet Take 60 mg by mouth every 12 (twelve) hours.    [provider]  ?Multiple Vitamins-Minerals (MULTIVITAMIN PO) Take 1 tablet by mouth daily.     [provider]  ?omeprazole (PRILOSEC) 40 MG capsule TK 1 C PO D 05/27/19   [provider]  ?ondansetron (ZOFRAN ODT) 4 MG disintegrating tablet Take 1 tablet (4 mg total) by mouth every  8 (eight) hours as needed. 06/29/21   Levert Feinstein, MD  ?oxyCODONE-acetaminophen (PERCOCET) 7.5-325 MG per tablet Take 1 tablet by mouth 3 (three) times daily.    [provider]  ?polyethylene glycol (MIRALAX / GLYCOLAX) packet Take 17 g by mouth daily as needed.  03/18/13   Leona Singleton, MD  ?SUMAtriptan (IMITREX) 50 MG tablet Take 1 tablet (50 mg total) by mouth every 2 (two) hours as needed for migraine. May repeat in 2 hours if headache persists or recurs. 06/29/21   Levert Feinstein, MD  ?topiramate (TOPAMAX) 100 MG tablet Take 1 tablet (100 mg total) by mouth at bedtime. 06/29/21   Levert Feinstein, MD  ?   ? ?Allergies    ?Patient has no known allergies.   ? ?Review of Systems   ?Review of Systems  ?Constitutional:  Negative for  chills and fever.  ?Cardiovascular:  Positive for leg swelling.  ?Musculoskeletal:  Positive for arthralgias.  ?Skin:  Positive for color change.  ?All other systems reviewed and are negative. ? ?Physical Exam ?Updated Vital Signs ?BP 131/77   Pulse 86   Temp 98.2 ?F (36.8 ?C) (Oral)   Resp 20   Wt 124.3 kg   SpO2 100%   BMI 48.54 kg/m?  ?Physical Exam ?Vitals and nursing note reviewed.  ?Constitutional:   ?   Appearance: She is not ill-appearing.  ?HENT:  ?   Head: Normocephalic and atraumatic.  ?Eyes:  ?   Conjunctiva/sclera: Conjunctivae normal.  ?Cardiovascular:  ?   Rate and Rhythm: Normal rate and regular rhythm.  ?Pulmonary:  ?   Effort: Pulmonary effort is normal.  ?   Breath sounds: Normal breath sounds. No wheezing, rhonchi or rales.  ?Abdominal:  ?   Palpations: Abdomen is soft.  ?   Tenderness: There is no abdominal tenderness.  ?Musculoskeletal:  ?   Cervical back: Neck supple.  ?   Left lower leg: Edema present.  ?   Comments: Mild swelling of LLE compared to RLE with associated erythema and increased warmth to the touch. + calf TTP. 2+ DP Pulse.   ?Skin: ?   General: Skin is warm and dry.  ?Neurological:  ?   Mental Status: She is alert.  ? ? ?ED Results / Procedures / Treatments   ?Labs ?(all labs ordered are listed, but only abnormal results are displayed) ?Labs Reviewed  ?CBC WITH DIFFERENTIAL/PLATELET - Abnormal; Notable for the following components:  ?    Result Value  ? Hemoglobin 11.4 (*)   ? HCT 34.5 (*)   ? Neutro Abs 9.0 (*)   ? Lymphs Abs 0.3 (*)   ? All other components within normal limits  ?BASIC METABOLIC PANEL - Abnormal; Notable for the following components:  ? Potassium 3.1 (*)   ? Glucose, Bld 104 (*)   ? Creatinine, Ser 1.04 (*)   ? Calcium 8.0 (*)   ? All other components within normal limits  ? ? ?EKG ?None ? ?Radiology ?US Venous Img Lower  Left (DVT Study) ? ?Result Date: 01/03/2022 ?CLINICAL DATA:  Left leg swelling EXAM: LEFT LOWER EXTREMITY VENOUS DOPPLER ULTRASOUND  TECHNIQUE: Gray-scale sonography with compression, as well as color and duplex ultrasound, were performed to evaluate the deep venous system(s) from the level of the common femoral vein through the popliteal and proximal calf veins. COMPARISON:  None. FINDINGS: VENOUS Normal compressibility of the common femoral, superficial femoral, and popliteal veins, as well as the visualized calf veins. Visualized portions of profunda  femoral vein and great saphenous vein unremarkable. No filling defects to suggest DVT on grayscale or color Doppler imaging. Doppler waveforms show normal direction of venous flow, normal respiratory plasticity and response to augmentation. Limited views of the contralateral common femoral vein are unremarkable. OTHER None. Limitations: none IMPRESSION: Negative. Electronically Signed   By: Helyn Numbers M.D.   On: 01/03/2022 21:43   ? ?Procedures ?Procedures  ? ? ?Medications Ordered in ED ?Medications  ?cephALEXin (KEFLEX) capsule 500 mg (has no administration in time range)  ?potassium chloride SA (KLOR-CON M) CR tablet 40 mEq (has no administration in time range)  ? ? ?ED Course/ Medical Decision Making/ A&P ?  ?                        ?Medical Decision Making ?51 year old female presents to the ED today with left leg swelling, pain, redness, increased warmth that began earlier today.  On arrival to the ED vitals are stable and patient appears to be no acute distress.  She does have mild swelling noted to the left lower extremity compared to right with associated erythema and increased warmth to the touch.  Concern for possibility of cellulitis at this time however she has calf tenderness palpation.  We will plan for DVT study to rule out DVT.  She denies any chest pain or shortness of breath.  We will plan for labs as well.  ? ?Work-up overall reassuring and patient negative for DVT.  Suspect cellulitis at this time.  Patient provided with dose of oral Keflex in the ED today and discharged  home with same.  She is instructed to follow-up with her PCP for same, she has an appointment scheduled on Monday.  She is advised to return to the ED for any new/worsening symptoms including no improvement after 48 hour

## 2022-01-03 NOTE — ED Triage Notes (Signed)
Pt arrives with c/o left leg pain that started today. Per pt, left leg has been throbbing and swelling. Pt Pt denies SOB or CP.  ?

## 2022-02-20 ENCOUNTER — Other Ambulatory Visit: Payer: Self-pay | Admitting: Neurology

## 2022-02-20 DIAGNOSIS — G35 Multiple sclerosis: Secondary | ICD-10-CM

## 2022-02-20 DIAGNOSIS — L408 Other psoriasis: Secondary | ICD-10-CM

## 2022-02-20 NOTE — Telephone Encounter (Signed)
Rx refilled.

## 2022-03-30 ENCOUNTER — Other Ambulatory Visit: Payer: Self-pay | Admitting: Neurology

## 2022-03-30 DIAGNOSIS — G35 Multiple sclerosis: Secondary | ICD-10-CM

## 2022-03-30 DIAGNOSIS — L408 Other psoriasis: Secondary | ICD-10-CM

## 2022-05-10 ENCOUNTER — Other Ambulatory Visit: Payer: Self-pay

## 2022-05-10 MED ORDER — BACLOFEN 20 MG PO TABS: 20.0000 mg | ORAL_TABLET | Freq: Three times a day (TID) | ORAL | 3 refills | Status: DC

## 2022-07-09 ENCOUNTER — Other Ambulatory Visit: Payer: Self-pay | Admitting: Neurology

## 2022-08-01 ENCOUNTER — Ambulatory Visit: Payer: Medicare HMO | Admitting: Neurology

## 2022-08-01 ENCOUNTER — Encounter: Payer: Self-pay | Admitting: Neurology

## 2022-08-01 NOTE — Progress Notes (Deleted)
Patient: [name] Date of Birth: 28-Aug-1971  Reason for Visit: Follow up History from: Patient Primary Neurologist: [name]  ASSESSMENT AND PLAN 51 y.o. year old female   1.  [name] 2.  Chronic migraine headaches 3.  Depression, anxiety, chronic pain   HISTORY  [name] is a 51 year-old right-handed [place] American single female from [place], [place], she was diagnosed with multiple sclerosis 10/2004 with positive MRI of the brain and 6 oligoclonal bands in CSF. She was patient of Dr. Sandria Manly. She was begun on Avonex. 03/12/2006 she had new cervical medullary lesion and was changed to rebif. She is in the EPOC study by Dr. Jodi Mourning, has been treated with Gilenya since 05/24/10. She is not having any side effects from the medication. She had a normal examination at the [facility] specialists clinic checking for macular edema on 08/17/10.     Update August 01, 2022 SS:   REVIEW OF SYSTEMS: Out of a complete 14 system review of symptoms, the patient complains only of the following symptoms, and all other reviewed systems are negative.  See HPI  ALLERGIES: No Known Allergies  HOME MEDICATIONS: Outpatient Medications Prior to Visit  Medication Sig Dispense Refill   amLODipine (NORVASC) 10 MG tablet Take 1 tablet by mouth daily.  2   baclofen (LIORESAL) 20 MG tablet Take 1 tablet (20 mg total) by mouth 3 (three) times daily. 270 tablet 3   cetirizine (ZYRTEC) 10 MG tablet Take 10 mg by mouth daily.     Cholecalciferol (VITAMIN D) 2000 units CAPS Take 1 capsule by mouth daily.     docusate sodium (COLACE) 100 MG capsule Take 100 mg by mouth 2 (two) times daily.      DULoxetine (CYMBALTA) 60 MG capsule Take 60 mg by mouth daily.     ferrous sulfate 325 (65 FE) MG tablet Take 325 mg by mouth daily with breakfast.     Fingolimod HCl 0.5 MG CAPS TAKE 1 CAPSULE BY MOUTH DAILY 30 capsule 4   FLUoxetine (PROZAC) 40 MG capsule TAKE 1  CAPSULE(40 MG) BY MOUTH DAILY 90 capsule 3   fluticasone (FLONASE) 50 MCG/ACT nasal spray Place 2 sprays into the nose daily.     hydrochlorothiazide (HYDRODIURIL) 12.5 MG tablet TK 1 T PO D  2   ketoconazole (NIZORAL) 2 % cream Apply 1 application topically as needed.      levothyroxine (SYNTHROID) 137 MCG tablet Take 150 mcg by mouth daily.     LYRICA 200 MG capsule TK ONE C PO HS  2   meloxicam (MOBIC) 15 MG tablet Take 15 mg by mouth daily as needed for pain.     morphine (MS CONTIN) 60 MG 12 hr tablet Take 60 mg by mouth every 12 (twelve) hours.     Multiple Vitamins-Minerals (MULTIVITAMIN PO) Take 1 tablet by mouth daily.      omeprazole (PRILOSEC) 40 MG capsule TK 1 C PO D     ondansetron (ZOFRAN ODT) 4 MG disintegrating tablet Take 1 tablet (4 mg total) by mouth every 8 (eight) hours as needed. 20 tablet 6   oxyCODONE-acetaminophen (PERCOCET) 7.5-325 MG per tablet Take 1 tablet by mouth 3 (three) times daily.     polyethylene glycol (MIRALAX / GLYCOLAX) packet Take 17 g by mouth daily as needed.      SUMAtriptan (IMITREX) 50 MG tablet Take 1 tablet (50 mg total) by mouth every 2 (two) hours as needed for migraine.  May repeat in 2 hours if headache persists or recurs. 12 tablet 5   topiramate (TOPAMAX) 100 MG tablet TAKE 1 TABLET(100 MG) BY MOUTH AT BEDTIME 90 tablet 3   No facility-administered medications prior to visit.    PAST MEDICAL HISTORY: Past Medical History:  Diagnosis Date   Acid reflux    Diabetes mellitus without complication (HCC)    states she was "prediabetic" at one time   Fibromyalgia    MS (multiple sclerosis) ([place])    Sleep apnea    Thyroid disease     PAST SURGICAL HISTORY: Past Surgical History:  Procedure Laterality Date   ENDOMETRIAL BIOPSY     HERNIA REPAIR      FAMILY HISTORY: Family History  Problem Relation Age of Onset   Hypertension Mother    Migraines Mother    High blood pressure Father    Lung cancer Father     SOCIAL  HISTORY: Social History   Socioeconomic History   Marital status: Divorced    Spouse name: Not on file   Number of children: 1   Years of education: college   Highest education level: Not on file  Occupational History    Employer: UNEMPLOYED    Comment: disabled  Tobacco Use   Smoking status: Former    Types: Cigarettes    Quit date: 10/01/1988    Years since quitting: 33.8   Smokeless tobacco: Never  Vaping Use   Vaping Use: Never used  Substance and Sexual Activity   Alcohol use: No    Alcohol/week: 0.0 standard drinks of alcohol    Comment: Quit alcohol consumption in 1990's   Drug use: No   Sexual activity: Not on file  Other Topics Concern   Not on file  Social History Narrative   She lives with her mother, daughter, she is on disability.    Right handed.   Education college.   Caffeine soda 2 daily   Social Determinants of Health   Financial Resource Strain: Not on file  Food Insecurity: Not on file  Transportation Needs: Not on file  Physical Activity: Not on file  Stress: Not on file  Social Connections: Not on file  Intimate Partner Violence: Not on file    PHYSICAL EXAM  There were no vitals filed for this visit. There is no height or weight on file to calculate BMI.  Generalized: Well developed, in no acute distress  Neurological examination  Mentation: Alert oriented to time, place, history taking. Follows all commands speech and language fluent Cranial nerve II-XII: Pupils were equal round reactive to light. Extraocular movements were full, visual field were full on confrontational test. Facial sensation and strength were normal. Uvula tongue midline. Head turning and shoulder shrug  were normal and symmetric. Motor: The motor testing reveals 5 over 5 strength of all 4 extremities. Good symmetric motor tone is noted throughout.  Sensory: Sensory testing is intact to soft touch on all 4 extremities. No evidence of extinction is noted.  Coordination:  Cerebellar testing reveals good finger-nose-finger and heel-to-shin bilaterally.  Gait and station: Gait is normal. Tandem gait is normal. Romberg is negative. No drift is seen.  Reflexes: Deep tendon reflexes are symmetric and normal bilaterally.   DIAGNOSTIC DATA (LABS, IMAGING, TESTING) - I reviewed patient records, labs, notes, testing and imaging myself where available.  Lab Results  Component Value Date   WBC 10.2 01/03/2022   HGB 11.4 (L) 01/03/2022   HCT 34.5 (L) 01/03/2022   MCV  81.9 01/03/2022   PLT 186 01/03/2022      Component Value Date/Time   NA 135 01/03/2022 2122   NA 141 06/15/2019 1010   K 3.1 (L) 01/03/2022 2122   CL 103 01/03/2022 2122   CO2 22 01/03/2022 2122   GLUCOSE 104 (H) 01/03/2022 2122   BUN 13 01/03/2022 2122   BUN 11 06/15/2019 1010   CREATININE 1.04 (H) 01/03/2022 2122   CALCIUM 8.0 (L) 01/03/2022 2122   PROT 6.9 06/15/2019 1010   ALBUMIN 4.3 06/15/2019 1010   AST 24 06/15/2019 1010   ALT 12 06/15/2019 1010   ALKPHOS 111 06/15/2019 1010   BILITOT 0.5 06/15/2019 1010   GFRNONAA >60 01/03/2022 2122   GFRAA >60 11/22/2019 1904   No results found for: "CHOL", "HDL", "LDLCALC", "LDLDIRECT", "TRIG", "CHOLHDL" Lab Results  Component Value Date   HGBA1C 5.5 03/16/2013   No results found for: "VITAMINB12" Lab Results  Component Value Date   TSH 0.138 (L) 03/16/2013    Margie Ege, AGNP-C, DNP 08/01/2022, 5:34 AM [facility] [address] [place], [place] [address] [phone]

## 2022-10-22 ENCOUNTER — Other Ambulatory Visit: Payer: Self-pay | Admitting: Neurology

## 2022-10-22 DIAGNOSIS — L408 Other psoriasis: Secondary | ICD-10-CM

## 2022-10-22 DIAGNOSIS — G35 Multiple sclerosis: Secondary | ICD-10-CM

## 2022-11-19 ENCOUNTER — Ambulatory Visit: Payer: Medicare HMO | Admitting: Neurology

## 2022-11-19 ENCOUNTER — Encounter: Payer: Self-pay | Admitting: Neurology

## 2022-11-19 VITALS — BP 113/74 | HR 85 | Ht 63.0 in | Wt 265.5 lb

## 2022-11-19 DIAGNOSIS — L408 Other psoriasis: Secondary | ICD-10-CM

## 2022-11-19 DIAGNOSIS — F419 Anxiety disorder, unspecified: Secondary | ICD-10-CM

## 2022-11-19 DIAGNOSIS — G35 Multiple sclerosis: Secondary | ICD-10-CM | POA: Diagnosis not present

## 2022-11-19 DIAGNOSIS — G43709 Chronic migraine without aura, not intractable, without status migrainosus: Secondary | ICD-10-CM

## 2022-11-19 MED ORDER — FINGOLIMOD HCL 0.5 MG PO CAPS: 1.0000 | ORAL_CAPSULE | Freq: Every day | ORAL | 4 refills | Status: DC

## 2022-11-19 MED ORDER — TOPIRAMATE 100 MG PO TABS: ORAL_TABLET | ORAL | 3 refills | Status: DC

## 2022-11-19 MED ORDER — SUMATRIPTAN SUCCINATE 50 MG PO TABS: 50.0000 mg | ORAL_TABLET | ORAL | 5 refills | Status: DC | PRN

## 2022-11-19 MED ORDER — FLUOXETINE HCL 40 MG PO CAPS
ORAL_CAPSULE | ORAL | 3 refills | Status: DC
Start: 2022-11-19 — End: 2024-03-02

## 2022-11-19 MED ORDER — BACLOFEN 20 MG PO TABS: 20.0000 mg | ORAL_TABLET | Freq: Three times a day (TID) | ORAL | 3 refills | Status: DC

## 2022-11-19 NOTE — Progress Notes (Signed)
Patient: [name] Date of Birth: 05/25/1971  Reason for Visit: Follow up for MS History from: Patient Primary Neurologist: Dr. Krista Blue  ASSESSMENT AND PLAN 52 y.o. year old female   1.  [name] -On Gilenya since August 2011, Dr. Krista Blue has had multiple discussions about stopping immunomodulation therapy given stable clinical and imaging course, she desires to remain on Gilenya -Needs updated labs, just drawn last week at PCP, she will send them to me for review (specifically CBC with differential, CMP) -Continue Baclofen 20 mg TID for muscle spasms  -Check MRI of the brain with and without contrast for subclinical progression  2.  Chronic migraine headaches -Under good control  -Continue Topamax 100 mg at bedtime, use Imitrex as needed   3.  Depression, anxiety, chronic pain -Stable, on Prozac 40 mg daily from [place], gets Lyrica, Oxycodone from PCP -She looks good today, better than in the past, speech is normal -Follow-up with me in 6 months or sooner if needed  HISTORY [name] is a 52 year-old right-handed [place] American single female from [place], [place], she was diagnosed with multiple sclerosis 10/2004 with positive MRI of the brain and 6 oligoclonal bands in CSF. She was patient of Dr. Erling Cruz. She was begun on Avonex. 03/12/2006 she had new cervical medullary lesion and was changed to rebif. She is in the EPOC study by Dr. Tyron Russell, has been treated with Gilenya since 05/24/10. She is not having any side effects from the medication. She had a normal examination at the [facility] specialists clinic checking for macular edema on 08/17/10.     06/25/11 MRI of the brain and cervical spine showed a few scattered supratentorial nonspecific foci of T2 hyperintensity without enhancement and no change versus 04/19/09. There was disc bulging at C5-6 and C6-7 without cord lesions present and no change versus 06/16/08.    She also has  hypothyroidism, diabetes, obstructive sleep apnea She is using CPAP At 13 cm of water with ESS 3. She had pulmonary function tests because of decreased breath sounds which were normal. 07/08/2012 she awoke with nausea and shaking of her right hand and arm, and inability to speak. Her right leg "did not want to go". The following day her speech was hesitant but well-formed. She was treated by iv followed by po steroid.    Update 06/29/21 Dr. Krista Blue: She is overall stable, no flareup of MS symptoms, we again discussed the possibility of starting long-term immunomodulation therapy, due to her stable symptoms, minimum findings on multiple repeat MRI of the brain with without contrast, I personally reviewed most recent MRI of the brain in October 2020, only few scattered T2/FLAIR hyperdensity, no significant change, MRI cervical spine showed no cord involvement, she is on Gilenya,  Continue has migraine headache couple times each week, responding well to Topamax every night as preventive medication, Imitrex 50 mg as needed, continue have gait abnormality, also limited by her big body habitus, chronic low back pain,   Update November 19, 2022 SS: She is speaking normal today, her 62 yo daughter has a new baby who live with her. Remains on Gilenya, MS is stable. Using cane, no recent falls, vision is fine, no numbness or weakness to b/b, if she overdoes it will feel MS (speech changes, arms flop, legs are weak). Takes Baclofen 20 mg TID helps with spasms mostly in back, Prozac 40 mg daily mood is good, Topamax 100 mg qhs for migraines doing okay, no bad  migraines in awhile, maybe took Imitrex once in last year. No other health issues besides fibromyalgia aggravation,gets Lyrica and oxycodone from PCP for chronic pain. Gets annual eye exam.   REVIEW OF SYSTEMS: Out of a complete 14 system review of symptoms, the patient complains only of the following symptoms, and all other reviewed systems are negative.  See  HPI  ALLERGIES: No Known Allergies  HOME MEDICATIONS: Outpatient Medications Prior to Visit  Medication Sig Dispense Refill   amLODipine (NORVASC) 10 MG tablet Take 1 tablet by mouth daily.  2   cetirizine (ZYRTEC) 10 MG tablet Take 10 mg by mouth daily.     Cholecalciferol (VITAMIN D) 2000 units CAPS Take 1 capsule by mouth daily.     docusate sodium (COLACE) 100 MG capsule Take 100 mg by mouth 2 (two) times daily.      ferrous sulfate 325 (65 FE) MG tablet Take 325 mg by mouth daily with breakfast.     fluticasone (FLONASE) 50 MCG/ACT nasal spray Place 2 sprays into the nose daily.     hydrochlorothiazide (HYDRODIURIL) 12.5 MG tablet TK 1 T PO D  2   ketoconazole (NIZORAL) 2 % cream Apply 1 application topically as needed.      levothyroxine (SYNTHROID) 150 MCG tablet Take 150 mcg by mouth daily.     LYRICA 200 MG capsule TK ONE C PO HS  2   meloxicam (MOBIC) 15 MG tablet Take 15 mg by mouth daily as needed for pain.     Multiple Vitamins-Minerals (MULTIVITAMIN PO) Take 1 tablet by mouth daily.      omeprazole (PRILOSEC) 40 MG capsule TK 1 C PO D     ondansetron (ZOFRAN ODT) 4 MG disintegrating tablet Take 1 tablet (4 mg total) by mouth every 8 (eight) hours as needed. 20 tablet 6   oxyCODONE-acetaminophen (PERCOCET) 7.5-325 MG per tablet Take 1 tablet by mouth 3 (three) times daily.     polyethylene glycol (MIRALAX / GLYCOLAX) packet Take 17 g by mouth daily as needed.      baclofen (LIORESAL) 20 MG tablet Take 1 tablet (20 mg total) by mouth 3 (three) times daily. 270 tablet 3   Fingolimod HCl 0.5 MG CAPS TAKE 1 CAPSULE BY MOUTH DAILY 30 capsule 4   FLUoxetine (PROZAC) 40 MG capsule TAKE 1 CAPSULE(40 MG) BY MOUTH DAILY 90 capsule 3   SUMAtriptan (IMITREX) 50 MG tablet Take 1 tablet (50 mg total) by mouth every 2 (two) hours as needed for migraine. May repeat in 2 hours if headache persists or recurs. 12 tablet 5   topiramate (TOPAMAX) 100 MG tablet TAKE 1 TABLET(100 MG) BY MOUTH AT  BEDTIME 90 tablet 3   DULoxetine (CYMBALTA) 60 MG capsule Take 60 mg by mouth daily.     morphine (MS CONTIN) 60 MG 12 hr tablet Take 60 mg by mouth every 12 (twelve) hours.     No facility-administered medications prior to visit.    PAST MEDICAL HISTORY: Past Medical History:  Diagnosis Date   Acid reflux    Diabetes mellitus without complication ([place])    states she was "prediabetic" at one time   Fibromyalgia    MS (multiple sclerosis) ([place])    Sleep apnea    Thyroid disease     PAST SURGICAL HISTORY: Past Surgical History:  Procedure Laterality Date   ENDOMETRIAL BIOPSY     HERNIA REPAIR      FAMILY HISTORY: Family History  Problem Relation Age of Onset  Hypertension Mother    Migraines Mother    High blood pressure Father    Lung cancer Father     SOCIAL HISTORY: Social History   Socioeconomic History   Marital status: Divorced    Spouse name: Not on file   Number of children: 1   Years of education: college   Highest education level: Not on file  Occupational History    Employer: UNEMPLOYED    Comment: disabled  Tobacco Use   Smoking status: Former    Types: Cigarettes    Quit date: 10/01/1988    Years since quitting: 34.1   Smokeless tobacco: Never  Vaping Use   Vaping Use: Never used  Substance and Sexual Activity   Alcohol use: No    Alcohol/week: 0.0 standard drinks of alcohol    Comment: Quit alcohol consumption in 1990's   Drug use: No   Sexual activity: Not on file  Other Topics Concern   Not on file  Social History Narrative   She lives with her mother, daughter, she is on disability.    Right handed.   Education college.   Caffeine soda 2 daily   Social Determinants of Health   Financial Resource Strain: Not on file  Food Insecurity: Not on file  Transportation Needs: Not on file  Physical Activity: Not on file  Stress: Not on file  Social Connections: Not on file  Intimate Partner Violence: Not on file    PHYSICAL  EXAM  Vitals:   11/19/22 0748  BP: 113/74  Pulse: 85  Weight: 265 lb 8 oz (120.4 kg)  Height: 5' 3"$  (1.6 m)   Body mass index is 47.03 kg/m.  Generalized: Well developed, in no acute distress  Neurological examination  Mentation: Alert oriented to time, place, history taking. Follows all commands speech and language fluent.  Speech is clear today, no accident. Cranial nerve II-XII: Pupils were equal round reactive to light. Extraocular movements were full, visual field were full on confrontational test. Facial sensation and strength were normal. Head turning and shoulder shrug  were normal and symmetric. Motor: The motor testing reveals 5 over 5 strength of all 4 extremities. Good symmetric motor tone is noted throughout.  Sensory: Sensory testing is intact to soft touch on all 4 extremities. No evidence of extinction is noted.  Coordination: Cerebellar testing reveals good finger-nose-finger and heel-to-shin bilaterally, but is slightly hard to lift legs Gait and station: Gait is cautious, stooped posture, legs in flexion at the knee, uses single-point cane Reflexes: Deep tendon reflexes are symmetric   DIAGNOSTIC DATA (LABS, IMAGING, TESTING) - I reviewed patient records, labs, notes, testing and imaging myself where available.  Lab Results  Component Value Date   WBC 10.2 01/03/2022   HGB 11.4 (L) 01/03/2022   HCT 34.5 (L) 01/03/2022   MCV 81.9 01/03/2022   PLT 186 01/03/2022      Component Value Date/Time   NA 135 01/03/2022 2122   NA 141 06/15/2019 1010   K 3.1 (L) 01/03/2022 2122   CL 103 01/03/2022 2122   CO2 22 01/03/2022 2122   GLUCOSE 104 (H) 01/03/2022 2122   BUN 13 01/03/2022 2122   BUN 11 06/15/2019 1010   CREATININE 1.04 (H) 01/03/2022 2122   CALCIUM 8.0 (L) 01/03/2022 2122   PROT 6.9 06/15/2019 1010   ALBUMIN 4.3 06/15/2019 1010   AST 24 06/15/2019 1010   ALT 12 06/15/2019 1010   ALKPHOS 111 06/15/2019 1010   BILITOT 0.5 06/15/2019 1010  GFRNONAA >60  01/03/2022 2122   GFRAA >60 11/22/2019 1904   No results found for: "CHOL", "HDL", "LDLCALC", "LDLDIRECT", "TRIG", "CHOLHDL" Lab Results  Component Value Date   HGBA1C 5.5 03/16/2013   No results found for: "VITAMINB12" Lab Results  Component Value Date   TSH 0.138 (L) 03/16/2013    Butler Denmark, AGNP-C, DNP 11/19/2022, 8:17 AM [facility] [address] [place], [place] [address] [phone]

## 2022-11-19 NOTE — Patient Instructions (Addendum)
Send me your labs once they result today (CBC with diff, CMP) Check MRI of the brain  See you back in 6 months

## 2022-11-22 ENCOUNTER — Telehealth: Payer: Self-pay | Admitting: Neurology

## 2022-11-22 ENCOUNTER — Telehealth: Payer: Self-pay | Admitting: *Deleted

## 2022-11-22 DIAGNOSIS — G35 Multiple sclerosis: Secondary | ICD-10-CM

## 2022-11-22 NOTE — Telephone Encounter (Signed)
[name]: [id number] exp. 11/22/22-12/22/22 sent to GI [id number]

## 2022-11-22 NOTE — Telephone Encounter (Signed)
Received lab results from [facility].

## 2022-11-28 NOTE — Telephone Encounter (Signed)
Labs from PCP collected 11/16/2022 vitamin D 29.6, creatinine 1.1, AST 23, ALT 10, alkaline phosphatase 92, TSH 6.085, A1c 5.0,, B12 384, WBC 4.3, lymphocyte percent 8.8. Will ask her to come by for recheck for CBC with diff since on Gilenya, I called.

## 2022-11-28 NOTE — Addendum Note (Signed)
Addended by: Suzzanne Cloud on: 11/28/2022 02:46 PM   Modules accepted: Orders

## 2023-01-01 ENCOUNTER — Other Ambulatory Visit: Payer: Medicare HMO

## 2023-01-02 ENCOUNTER — Ambulatory Visit
Admission: RE | Admit: 2023-01-02 | Discharge: 2023-01-02 | Disposition: A | Discharge status: Home / Self Care | Payer: Medicare HMO | Source: Ambulatory Visit | Attending: Neurology | Admitting: Neurology

## 2023-01-02 DIAGNOSIS — G35 Multiple sclerosis: Secondary | ICD-10-CM

## 2023-01-02 MED ORDER — GADOPICLENOL 0.5 MMOL/ML IV SOLN
10.0000 mL | Freq: Once | INTRAVENOUS | Status: AC | PRN
  Administered 2023-01-02: 10 mL via INTRAVENOUS

## 2023-01-03 ENCOUNTER — Telehealth: Payer: Self-pay | Admitting: *Deleted

## 2023-01-03 NOTE — Telephone Encounter (Addendum)
Called and spoke with pt about MRI results per SS,NP note. Pt verbalized understanding and appreciation.  She will come sometime next week for labs. Aware she can come Mon-Thurs 8-5pm. Aware lab tech typically takes lunch between 12-1pm.

## 2023-02-18 ENCOUNTER — Emergency Department (HOSPITAL_BASED_OUTPATIENT_CLINIC_OR_DEPARTMENT_OTHER)
Admission: EM | Admit: 2023-02-18 | Discharge: 2023-02-19 | Disposition: A | Discharge status: Home / Self Care | Payer: Medicare (Managed Care) | Attending: Emergency Medicine | Admitting: Emergency Medicine

## 2023-02-18 ENCOUNTER — Emergency Department (HOSPITAL_BASED_OUTPATIENT_CLINIC_OR_DEPARTMENT_OTHER): Payer: Medicare (Managed Care)

## 2023-02-18 ENCOUNTER — Encounter (HOSPITAL_BASED_OUTPATIENT_CLINIC_OR_DEPARTMENT_OTHER): Payer: Self-pay | Admitting: Urology

## 2023-02-18 ENCOUNTER — Telehealth: Payer: Self-pay | Admitting: Neurology

## 2023-02-18 ENCOUNTER — Emergency Department (HOSPITAL_COMMUNITY): Payer: Medicare (Managed Care)

## 2023-02-18 DIAGNOSIS — R519 Headache, unspecified: Secondary | ICD-10-CM | POA: Insufficient documentation

## 2023-02-18 DIAGNOSIS — E119 Type 2 diabetes mellitus without complications: Secondary | ICD-10-CM | POA: Insufficient documentation

## 2023-02-18 DIAGNOSIS — R531 Weakness: Secondary | ICD-10-CM | POA: Diagnosis present

## 2023-02-18 DIAGNOSIS — Z79899 Other long term (current) drug therapy: Secondary | ICD-10-CM | POA: Insufficient documentation

## 2023-02-18 LAB — URINALYSIS, MICROSCOPIC (REFLEX)

## 2023-02-18 LAB — CBC
HCT: 36.5 % (ref 36.0–46.0)
Hemoglobin: 11.3 g/dL — ABNORMAL LOW (ref 12.0–15.0)
MCH: 26 pg (ref 26.0–34.0)
MCHC: 31 g/dL (ref 30.0–36.0)
MCV: 84.1 fL (ref 80.0–100.0)
Platelets: 244 10*3/uL (ref 150–400)
RBC: 4.34 MIL/uL (ref 3.87–5.11)
RDW: 14.6 % (ref 11.5–15.5)
WBC: 4.5 10*3/uL (ref 4.0–10.5)
nRBC: 0 % (ref 0.0–0.2)

## 2023-02-18 LAB — URINALYSIS, ROUTINE W REFLEX MICROSCOPIC
Bilirubin Urine: NEGATIVE
Glucose, UA: NEGATIVE mg/dL
Hgb urine dipstick: NEGATIVE
Ketones, ur: NEGATIVE mg/dL
Nitrite: NEGATIVE
Protein, ur: NEGATIVE mg/dL
Specific Gravity, Urine: 1.025 (ref 1.005–1.030)
pH: 6 (ref 5.0–8.0)

## 2023-02-18 LAB — BASIC METABOLIC PANEL
Anion gap: 6 (ref 5–15)
BUN: 12 mg/dL (ref 6–20)
CO2: 25 mmol/L (ref 22–32)
Calcium: 8.2 mg/dL — ABNORMAL LOW (ref 8.9–10.3)
Chloride: 109 mmol/L (ref 98–111)
Creatinine, Ser: 0.86 mg/dL (ref 0.44–1.00)
GFR, Estimated: >60 mL/min (ref >60)
Glucose, Bld: 97 mg/dL (ref 70–99)
Potassium: 3.6 mmol/L (ref 3.5–5.1)
Sodium: 140 mmol/L (ref 135–145)

## 2023-02-18 MED ORDER — OXYCODONE-ACETAMINOPHEN 5-325 MG PO TABS
1.0000 | ORAL_TABLET | Freq: Once | ORAL | Status: AC
  Administered 2023-02-18: 1 via ORAL
  Filled 2023-02-18: qty 1

## 2023-02-18 MED ORDER — GADOBUTROL 1 MMOL/ML IV SOLN
10.0000 mL | Freq: Once | INTRAVENOUS | Status: AC | PRN
  Administered 2023-02-18: 10 mL via INTRAVENOUS

## 2023-02-18 NOTE — ED Notes (Signed)
Pt laying in bed, daughter currently not here. Pt c/o back and bilateral leg pain, requesting home meds. MD notified.

## 2023-02-18 NOTE — Telephone Encounter (Signed)
Pt called. Stated her MS is relapsing, stated legs are weak and speak is slur. Stated this has been going on for five days now. I'm informed pt she may need to follow-up with ER or Urgent Care.

## 2023-02-18 NOTE — ED Notes (Signed)
ED Provider at bedside. 

## 2023-02-18 NOTE — ED Triage Notes (Signed)
Pt states MS relapse was sent by pcp  States speech problem, back pain, tremors, weakness, leg and arm pain x 5 days  Was without medication for 2 days due to attempt to changes meds and insurance wouldn't pay

## 2023-02-18 NOTE — ED Provider Notes (Signed)
[place] [facility] Provider Note   CSN: [id number] Arrival date & time: 02/18/23  1438     History  Chief Complaint  Patient presents with   Weakness    [name] is a 52 y.o. female.  Patient here with suspected MS flare.  History of MS, chronic pain, fibromyalgia, diabetes.  She was without air conditioning in the last week or so, had a couple days without her chronic narcotic pain medicine and then started develop MS flare symptoms with generalized weakness in her arms and legs and speech difficulty which is consistent with her prior flares.  She was directed by her neurologist to come for evaluation.  She denies any falls.  Denies any cough or sputum production or fever or pain with urination.  No abdominal pain nausea vomiting diarrhea.  The history is provided by the patient.       Home Medications Prior to Admission medications   Medication Sig Start Date End Date Taking? Authorizing Provider  amLODipine (NORVASC) 10 MG tablet Take 1 tablet by mouth daily. 02/07/18   [provider]  baclofen (LIORESAL) 20 MG tablet Take 1 tablet (20 mg total) by mouth 3 (three) times daily. 11/19/22   Glean Salvo, NP  cetirizine (ZYRTEC) 10 MG tablet Take 10 mg by mouth daily.    [provider]  Cholecalciferol (VITAMIN D) 2000 units CAPS Take 1 capsule by mouth daily.    [provider]  docusate sodium (COLACE) 100 MG capsule Take 100 mg by mouth 2 (two) times daily.     [provider]  ferrous sulfate 325 (65 FE) MG tablet Take 325 mg by mouth daily with breakfast.    [provider]  Fingolimod HCl 0.5 MG CAPS Take 1 capsule (0.5 mg total) by mouth daily. 11/19/22   Glean Salvo, NP  FLUoxetine (PROZAC) 40 MG capsule TAKE 1 CAPSULE(40 MG) BY MOUTH DAILY 11/19/22   Glean Salvo, NP  fluticasone [facility]) 50 MCG/ACT nasal spray Place 2 sprays into the nose daily.    [provider]  hydrochlorothiazide (HYDRODIURIL) 12.5 MG tablet TK 1 T PO D 01/10/17   [provider]  ketoconazole (NIZORAL) 2 % cream Apply 1 application topically as needed.  06/03/19   [provider]  levothyroxine (SYNTHROID) 150 MCG tablet Take 150 mcg by mouth daily. 06/02/20   [provider]  LYRICA 200 MG capsule TK ONE C PO HS 02/08/17   [provider]  meloxicam (MOBIC) 15 MG tablet Take 15 mg by mouth daily as needed for pain.    [provider]  Multiple Vitamins-Minerals (MULTIVITAMIN PO) Take 1 tablet by mouth daily.     [provider]  omeprazole (PRILOSEC) 40 MG capsule TK 1 C PO D 05/27/19   [provider]  ondansetron (ZOFRAN ODT) 4 MG disintegrating tablet Take 1 tablet (4 mg total) by mouth every 8 (eight) hours as needed. 06/29/21   Levert Feinstein, MD  oxyCODONE-acetaminophen (PERCOCET) 7.5-325 MG per tablet Take 1 tablet by mouth 3 (three) times daily.    [provider]  polyethylene glycol (MIRALAX / GLYCOLAX) packet Take 17 g by mouth daily as needed.  03/18/13   Leona Singleton, MD  SUMAtriptan (IMITREX) 50 MG tablet Take 1 tablet (50 mg total) by mouth every 2 (two) hours as needed for migraine. May repeat in 2 hours if headache persists or recurs. 11/19/22   Christia Reading,  Lindalou Hose, NP  topiramate (TOPAMAX) 100 MG tablet TAKE 1 TABLET(100 MG) BY MOUTH AT BEDTIME 11/19/22   Glean Salvo, NP      Allergies    Patient has no known allergies.    Review of Systems   Review of Systems  Physical Exam Updated Vital Signs BP (!) 138/90 (BP Location: Right Arm)   Pulse 64   Temp 97.8 F (36.6 C) (Oral)   Resp 20   Ht 5\' 3"  (1.6 m)   Wt 124.3 kg   SpO2 100%   BMI 48.54 kg/m  Physical Exam Vitals and nursing note reviewed.  Constitutional:      General: She is not in acute distress.    Appearance: She is well-developed. She is not ill-appearing.  HENT:     Head: Normocephalic and atraumatic.  Eyes:      Extraocular Movements: Extraocular movements intact.     Conjunctiva/sclera: Conjunctivae normal.     Pupils: Pupils are equal, round, and reactive to light.  Cardiovascular:     Rate and Rhythm: Normal rate and regular rhythm.     Pulses: Normal pulses.     Heart sounds: Normal heart sounds. No murmur heard. Pulmonary:     Effort: Pulmonary effort is normal. No respiratory distress.     Breath sounds: Normal breath sounds.  Abdominal:     Palpations: Abdomen is soft.     Tenderness: There is no abdominal tenderness.  Musculoskeletal:        General: No swelling.     Cervical back: Normal range of motion and neck supple.  Skin:    General: Skin is warm and dry.     Capillary Refill: Capillary refill takes less than 2 seconds.  Neurological:     Mental Status: She is alert.     Comments: She has difficulty with speech but cranial nerves appear to be intact, she seems globally weak in all 4 extremities, normal sensation, normal visual fields  Psychiatric:        Mood and Affect: Mood normal.     ED Results / Procedures / Treatments   Labs (all labs ordered are listed, but only abnormal results are displayed) Labs Reviewed  BASIC METABOLIC PANEL - Abnormal; Notable for the following components:      Result Value   Calcium 8.2 (*)    All other components within normal limits  CBC - Abnormal; Notable for the following components:   Hemoglobin 11.3 (*)    All other components within normal limits  URINALYSIS, ROUTINE W REFLEX MICROSCOPIC - Abnormal; Notable for the following components:   Leukocytes,Ua TRACE (*)    All other components within normal limits  URINALYSIS, MICROSCOPIC (REFLEX) - Abnormal; Notable for the following components:   Bacteria, UA RARE (*)    All other components within normal limits    EKG EKG Interpretation  Date/Time:  Monday Feb 18 2023 14:53:24 EDT Ventricular Rate:  66 PR Interval:  179 QRS Duration: 86 QT Interval:  410 QTC  Calculation: 430 R Axis:   -15 Text Interpretation: Sinus rhythm Low voltage, precordial leads LVH by voltage Anterior Q waves, possibly due to LVH Confirmed by Virgina Norfolk ([id number]) on 02/18/2023 2:55:54 PM  Radiology DG Chest 2 View  Result Date: 02/18/2023 CLINICAL DATA:  Weakness EXAM: CHEST - 2 VIEW COMPARISON:  None Available. FINDINGS: No pleural effusion. No pneumothorax. No focal airspace opacity. There are prominent bilateral interstitial opacities that can be seen in the setting of  pulmonary venous congestion or atypical infection. Normal cardiac and mediastinal contours. No radiographically apparent displaced rib fractures. Visualized upper abdomen is unremarkable. Vertebral body heights are maintained. IMPRESSION: Prominent bilateral interstitial opacities that can be seen in the setting of pulmonary venous congestion or atypical infection. Electronically Signed   By: Lorenza Cambridge M.D.   On: 02/18/2023 15:34    Procedures Procedures    Medications Ordered in ED Medications - No data to display  ED Course/ Medical Decision Making/ A&P                             Medical Decision Making Amount and/or Complexity of Data Reviewed Labs: ordered. Radiology: ordered.   Elizabth L George is here with concern for MS flare.  Normal vitals.  No fever.  History of MS, chronic pain, fibromyalgia, diabetes.  Seems like she has been without air conditioning for the last week, missed a couple days of her chronic pain medication due to some insurance/pharmacy issues.  Seems may be the cause of her which she thinks is MS flares.  She is having speech issues like she normally does when she has a flare and generalized weakness in her arms and legs when she typically has a flare.  She denies any falls.  No vision changes.  Cranial nerves appear to be intact.  She is got a little bit of dysarthria with generalized weakness throughout.  Overall differential diagnosis likely MS flare but will  evaluate for electrolyte abnormality, infectious process.  She has no headache.  Will get basic labs and CT scan head.  Per my review and interpretation of labs is no significant anemia or electrolyte abnormality or kidney injury.  Urinalysis negative for infection.  Chest x-ray negative for infection per my review and interpretation.  They do question may be atypical infection on chest x-ray but she has no fever cough or sputum production.  Head CT is unremarkable.  I talked with Dr. Amada Jupiter with neurology and he is recommending ED to ED transfer to [facility] for MRI with and without contrast of the brain.  I have made Dr. Selina Cooley aware of patient being transferred as well.  Recommend neurology consultation upon arrival there.  Dr. Adela Lank accepts the patient in transfer as well.  This chart was dictated using voice recognition software.  Despite best efforts to proofread,  errors can occur which can change the documentation meaning.         Final Clinical Impression(s) / ED Diagnoses Final diagnoses:  Generalized weakness    Rx / DC Orders ED Discharge Orders     None         Virgina Norfolk, DO 02/18/23 1750

## 2023-02-18 NOTE — ED Triage Notes (Signed)
Pt arrives from [facility] for MRI to evaluate generalized weakness, speech changes, and tremor. Similar sx in past with MS flares. Arrives [facility]. Able to stand and pivot to bed. Cane at baseline. See original [facility] triage for additional details.

## 2023-02-18 NOTE — Telephone Encounter (Signed)
Call to patient, she states that she is weak all over and speech is very slow, spaced, and broken. I asked if someone was in the home with her and she stated that her daughter was sleeping and she is 74yrs old. I asked if she could wake her up but she stated it would be hard to get to her due to tremors. She was able to get her other daughter on the phone who states this is different her baseline. Advised daughter [name]  that she should take her to the ER [facility] urgent [facility] for evaluation since this is not resolving and differently from baseline. Daughter in agreement. Advised to contact our office for follow up appointment.

## 2023-02-18 NOTE — ED Provider Notes (Addendum)
Patient has been sent over from [facility] for MRI of the brain to rule assess for acute MS exacerbation.  Patient is having generalized upper and lower extremity weakness and difficulty with speech.  Patient's MRI returned without evidence of acute MS flare.  Will consult neurology for further recommendation.    Patient MRI negative.  Discussed with Dr. Derry Lory.  This appears to be a pseudo exacerbation of symptoms.  I discussed what this meant with the patient.  The patient seemed a little bit confused by this diagnosis however the daughter was out right frustrated and upset and states "we waited 12 hours to hear there is nothing wrong with her there is clearly something wrong."  I stated that I would be happy to see to have the neurologist come talk with him as that is his specialty but this should resolve on its own and can be followed up by her outpatient neurologist.  They ultimately decided to leave after an extended wait.  Due to the fact that the neurologist was dealing with neurologic emergencies.  Arthor Captain, PA-C 02/18/23 2100    Arthor Captain, PA-C 02/19/23 0013    Wynetta Fines, MD 02/26/23 [phone]

## 2023-02-19 ENCOUNTER — Other Ambulatory Visit: Payer: Self-pay

## 2023-02-19 ENCOUNTER — Telehealth: Payer: Self-pay | Admitting: Neurology

## 2023-02-19 DIAGNOSIS — G35 Multiple sclerosis: Secondary | ICD-10-CM

## 2023-02-19 DIAGNOSIS — L408 Other psoriasis: Secondary | ICD-10-CM

## 2023-02-19 MED ORDER — FINGOLIMOD HCL 0.5 MG PO CAPS
1.0000 | ORAL_CAPSULE | Freq: Every day | ORAL | 2 refills | Status: DC
Start: 2023-02-19 — End: 2023-06-04

## 2023-02-19 NOTE — Telephone Encounter (Signed)
Pt called stating that she is needing a refill on her GILENYA 0.5 MG CAPS  sent in to the [facility] in [facility]

## 2023-02-19 NOTE — Discharge Instructions (Addendum)
What is a "pseudoexacerbation"? Pseudoexacerbation Facts  Not caused by new damage Brought on by other influences Urinary tract infection is most common cause  It is important to know that occasionally symptoms are not caused by new damage and these flare-ups are called pseudoexacerbations. A pseudoexacerbation is a temporary worsening of symptoms without actual myelin inflammation or damage, brought on by other influences. These can include other illnesses or infection, exercise, a warm environment, depression, exhaustion, and stress. When symptoms flare, checking for a fever is important, since even a minor infection and slight increase in temperature can cause symptoms to appear.  Urinary tract infection (UTI) is the most common type of infection to cause a pseudoexacerbation. Additionally, people with "heat-sensitive" MS will experience a temporary increase in symptoms when their body temperature rises, often after exercise. Many heat-sensitive individuals may opt to avoid hot tubs, saunas, or other situations that can raise the body's temperature. Cooling vests and other types of cooling apparel or devices may be used, and these are often helpful for people with heat-sensitive MS to keep their body temperature down while in a warm environment.  While a pseudoexacerbation is a flare-up of symptoms, similar to a relapse, it is not caused by an increase in disease activity. Instead, it's usually caused by various types of physical stress that impact your overall health. Once the cause of the stress is resolved, this type of symptom flare-up will usually subside within 24 hours.  Please call and follow closely with your neurologist.

## 2023-02-19 NOTE — Telephone Encounter (Signed)
Phone rep called pt, she now has [employer], she said when she went to [facility] in [place] yesterday she provided them her [place] insurance card(Member ID [id number], she then gave an ID # of #[id number] effective Date 01-30-23) Pt states this may be why she was told a new prescription for the  GILENYA 0.5 MG CAPS  has been requested from pharmacy.

## 2023-02-28 ENCOUNTER — Other Ambulatory Visit (HOSPITAL_COMMUNITY): Payer: Self-pay

## 2023-02-28 ENCOUNTER — Telehealth: Payer: Self-pay

## 2023-02-28 NOTE — Telephone Encounter (Signed)
Pharmacy Patient Advocate Encounter   Received notification from [facility] that prior authorization for Fingolimod HCl 0.5MG  capsules is required/requested.   PA submitted on 02/28/2023 to (ins) [employer] via [employer] or [facility]) confirmation # [id number] Status is pending

## 2023-03-02 NOTE — Telephone Encounter (Signed)
Patient Advocate Encounter  Prior Authorization for Fingolimod HCl 0.5MG  capsules has been approved.    PA# [id number] Insurance [employer] [facility] [facility] Medicare Electronic PA Form Effective dates: 02/28/2023 through 02/27/2024

## 2023-03-30 ENCOUNTER — Telehealth: Payer: Self-pay

## 2023-03-30 ENCOUNTER — Other Ambulatory Visit (HOSPITAL_COMMUNITY): Payer: Self-pay

## 2023-03-30 NOTE — Telephone Encounter (Signed)
Pharmacy Patient Advocate Encounter   Received notification from [facility] [employer] that prior authorization for Topiramate 100MG  tablets is required/requested.   PA submitted to [employer] via CoverMyMeds Key or (Medicaid) confirmation # [id number] Status is pending

## 2023-04-01 ENCOUNTER — Other Ambulatory Visit (HOSPITAL_COMMUNITY): Payer: Self-pay

## 2023-04-01 NOTE — Telephone Encounter (Signed)
Pharmacy Patient Advocate Encounter  Prior Authorization for Topiramate 100MG  tablets has been APPROVED by [employer] from 02/28/2023 to 03/29/2024.   PA # PA Case ID: [id number]  Copay is $0 per [facility] test claims for 90DS.

## 2023-06-03 ENCOUNTER — Other Ambulatory Visit: Payer: Self-pay | Admitting: Neurology

## 2023-06-03 DIAGNOSIS — G35 Multiple sclerosis: Secondary | ICD-10-CM

## 2023-06-03 DIAGNOSIS — L408 Other psoriasis: Secondary | ICD-10-CM

## 2023-06-03 DIAGNOSIS — G35D Multiple sclerosis, unspecified: Secondary | ICD-10-CM

## 2023-06-05 NOTE — Progress Notes (Unsigned)
Patient: [name] Date of Birth: [birth year]  Reason for Visit: Follow up for MS History from: Patient Primary Neurologist: Dr. Terrace George  ASSESSMENT AND PLAN 52 y.o. year old female   1.  [name] -On Gilenya since August 2011, Dr. Terrace George has had multiple discussions about stopping immunomodulation therapy given stable clinical and imaging course, she desires to remain on Gilenya -Needs updated labs, just drawn last week at PCP, she will send them to me for review (specifically CBC with differential, CMP) -Continue Baclofen 20 mg TID for muscle spasms  -Check MRI of the brain with and without contrast for subclinical progression  2.  Chronic migraine headaches -Under good control  -Continue Topamax 100 mg at bedtime, use Imitrex as needed   3.  Depression, anxiety, chronic pain -Stable, on Prozac 40 mg daily from [place], gets Lyrica, Oxycodone from PCP -She looks good today, better than in the past, speech is normal -Follow-up with me in 6 months or sooner if needed  HISTORY [name] is a 52 year-old right-handed [place] American single female from [place], [place], she was diagnosed with multiple sclerosis 10/2004 with positive MRI of the brain and 6 oligoclonal bands in CSF. She was patient of Dr. Sandria George. She was begun on Avonex. 03/12/2006 she had new cervical medullary lesion and was changed to rebif. She is in the EPOC study by Dr. Jodi George, has been treated with Gilenya since 05/24/10. She is not having any side effects from the medication. She had a normal examination at the [facility] specialists clinic checking for macular edema on 08/17/10.     06/25/11 MRI of the brain and cervical spine showed a few scattered supratentorial nonspecific foci of T2 hyperintensity without enhancement and no change versus 04/19/09. There was disc bulging at C5-6 and C6-7 without cord lesions present and no change versus 06/16/08.    She also has  hypothyroidism, diabetes, obstructive sleep apnea She is using CPAP At 13 cm of water with ESS 3. She had pulmonary function tests because of decreased breath sounds which were normal. 07/08/2012 she awoke with nausea and shaking of her right hand and arm, and inability to speak. Her right leg "did not want to go". The following day her speech was hesitant but well-formed. She was treated by iv followed by po steroid.    Update 06/29/21 Dr. Terrace George: She is overall stable, no flareup of MS symptoms, we again discussed the possibility of starting long-term immunomodulation therapy, due to her stable symptoms, minimum findings on multiple repeat MRI of the brain with without contrast, I personally reviewed most recent MRI of the brain in October 2020, only few scattered T2/FLAIR hyperdensity, no significant change, MRI cervical spine showed no cord involvement, she is on Gilenya,  Continue has migraine headache couple times each week, responding well to Topamax every night as preventive medication, Imitrex 50 mg as needed, continue have gait abnormality, also limited by her big body habitus, chronic low back pain,   Update November 19, 2022 SS: She is speaking normal today, her 74 yo daughter has a new baby who live with her. Remains on Gilenya, MS is stable. Using cane, no recent falls, vision is fine, no numbness or weakness to b/b, if she overdoes it will feel MS (speech changes, arms flop, legs are weak). Takes Baclofen 20 mg TID helps with spasms mostly in back, Prozac 40 mg daily mood is good, Topamax 100 mg qhs for migraines doing okay, no bad  migraines in awhile, maybe took Imitrex once in last year. No other health issues besides fibromyalgia aggravation,gets Lyrica and oxycodone from PCP for chronic pain. Gets annual eye exam.   Update June 06, 2023 SS:   REVIEW OF SYSTEMS: Out of a complete 14 system review of symptoms, the patient complains only of the following symptoms, and all other reviewed  systems are negative.  See HPI  ALLERGIES: No Known Allergies  HOME MEDICATIONS: Outpatient Medications Prior to Visit  Medication Sig Dispense Refill   amLODipine (NORVASC) 10 MG tablet Take 1 tablet by mouth daily.  2   baclofen (LIORESAL) 20 MG tablet Take 1 tablet (20 mg total) by mouth 3 (three) times daily. 270 tablet 3   cetirizine (ZYRTEC) 10 MG tablet Take 10 mg by mouth daily.     Cholecalciferol (VITAMIN D) 2000 units CAPS Take 1 capsule by mouth daily.     docusate sodium (COLACE) 100 MG capsule Take 100 mg by mouth 2 (two) times daily.      ferrous sulfate 325 (65 FE) MG tablet Take 325 mg by mouth daily with breakfast.     Fingolimod HCl 0.5 MG CAPS TAKE 1 CAPSULE(0.5 MG) BY MOUTH DAILY. 30 capsule 2   FLUoxetine (PROZAC) 40 MG capsule TAKE 1 CAPSULE(40 MG) BY MOUTH DAILY 90 capsule 3   fluticasone (FLONASE) 50 MCG/ACT nasal spray Place 2 sprays into the nose daily.     hydrochlorothiazide (HYDRODIURIL) 12.5 MG tablet TK 1 T PO D  2   ketoconazole (NIZORAL) 2 % cream Apply 1 application topically as needed.      levothyroxine (SYNTHROID) 150 MCG tablet Take 150 mcg by mouth daily.     LYRICA 200 MG capsule TK ONE C PO HS  2   meloxicam (MOBIC) 15 MG tablet Take 15 mg by mouth daily as needed for pain.     Multiple Vitamins-Minerals (MULTIVITAMIN PO) Take 1 tablet by mouth daily.      omeprazole (PRILOSEC) 40 MG capsule TK 1 C PO D     ondansetron (ZOFRAN ODT) 4 MG disintegrating tablet Take 1 tablet (4 mg total) by mouth every 8 (eight) hours as needed. 20 tablet 6   oxyCODONE-acetaminophen (PERCOCET) 7.5-325 MG per tablet Take 1 tablet by mouth 3 (three) times daily.     polyethylene glycol (MIRALAX / GLYCOLAX) packet Take 17 g by mouth daily as needed.      SUMAtriptan (IMITREX) 50 MG tablet Take 1 tablet (50 mg total) by mouth every 2 (two) hours as needed for migraine. May repeat in 2 hours if headache persists or recurs. 12 tablet 5   topiramate (TOPAMAX) 100 MG  tablet TAKE 1 TABLET(100 MG) BY MOUTH AT BEDTIME 90 tablet 3   No facility-administered medications prior to visit.    PAST MEDICAL HISTORY: Past Medical History:  Diagnosis Date   Acid reflux    Diabetes mellitus without complication (HCC)    states she was "prediabetic" at one time   Fibromyalgia    MS (multiple sclerosis) (HCC)    Sleep apnea    Thyroid disease     PAST SURGICAL HISTORY: Past Surgical History:  Procedure Laterality Date   ENDOMETRIAL BIOPSY     HERNIA REPAIR      FAMILY HISTORY: Family History  Problem Relation Age of Onset   Hypertension Mother    Migraines Mother    High blood pressure Father    Lung cancer Father     SOCIAL HISTORY: Social  History   Socioeconomic History   Marital status: Divorced    Spouse name: Not on file   Number of children: 1   Years of education: college   Highest education level: Not on file  Occupational History    Employer: UNEMPLOYED    Comment: disabled  Tobacco Use   Smoking status: Former    Current packs/day: 0.00    Types: Cigarettes    Quit date: 10/01/1988    Years since quitting: 34.6   Smokeless tobacco: Never  Vaping Use   Vaping status: Never Used  Substance and Sexual Activity   Alcohol use: No    Alcohol/week: 0.0 standard drinks of alcohol    Comment: Quit alcohol consumption in 1990's   Drug use: No   Sexual activity: Not on file  Other Topics Concern   Not on file  Social History Narrative   She lives with her mother, daughter, she is on disability.    Right handed.   Education college.   Caffeine soda 2 daily   Social Determinants of Health   Financial Resource Strain: Not on file  Food Insecurity: Not on file  Transportation Needs: Not on file  Physical Activity: Not on file  Stress: Not on file  Social Connections: Not on file  Intimate Partner Violence: Not on file    PHYSICAL EXAM  There were no vitals filed for this visit.  There is no height or weight on file to  calculate BMI.  Generalized: Well developed, in no acute distress  Neurological examination  Mentation: Alert oriented to time, place, history taking. Follows all commands speech and language fluent.  Speech is clear today, no accident. Cranial nerve II-XII: Pupils were equal round reactive to light. Extraocular movements were full, visual field were full on confrontational test. Facial sensation and strength were normal. Head turning and shoulder shrug  were normal and symmetric. Motor: The motor testing reveals 5 over 5 strength of all 4 extremities. Good symmetric motor tone is noted throughout.  Sensory: Sensory testing is intact to soft touch on all 4 extremities. No evidence of extinction is noted.  Coordination: Cerebellar testing reveals good finger-nose-finger and heel-to-shin bilaterally, but is slightly hard to lift legs Gait and station: Gait is cautious, stooped posture, legs in flexion at the knee, uses single-point cane Reflexes: Deep tendon reflexes are symmetric   DIAGNOSTIC DATA (LABS, IMAGING, TESTING) - I reviewed patient records, labs, notes, testing and imaging myself where available.  Lab Results  Component Value Date   WBC 4.5 02/18/2023   HGB 11.3 (L) 02/18/2023   HCT 36.5 02/18/2023   MCV 84.1 02/18/2023   PLT 244 02/18/2023      Component Value Date/Time   NA 140 02/18/2023 1455   NA 141 06/15/2019 1010   K 3.6 02/18/2023 1455   CL 109 02/18/2023 1455   CO2 25 02/18/2023 1455   GLUCOSE 97 02/18/2023 1455   BUN 12 02/18/2023 1455   BUN 11 06/15/2019 1010   CREATININE 0.86 02/18/2023 1455   CALCIUM 8.2 (L) 02/18/2023 1455   PROT 6.9 06/15/2019 1010   ALBUMIN 4.3 06/15/2019 1010   AST 24 06/15/2019 1010   ALT 12 06/15/2019 1010   ALKPHOS 111 06/15/2019 1010   BILITOT 0.5 06/15/2019 1010   GFRNONAA >60 02/18/2023 1455   GFRAA >60 11/22/2019 1904   No results found for: "CHOL", "HDL", "LDLCALC", "LDLDIRECT", "TRIG", "CHOLHDL" Lab Results  Component  Value Date   HGBA1C 5.5 03/16/2013  No results found for: "VITAMINB12" Lab Results  Component Value Date   TSH 0.138 (L) 03/16/2013    Margie Ege, AGNP-C, DNP 06/05/2023, 9:56 PM [facility] [address] [place], [place] [address] [phone]

## 2023-06-06 ENCOUNTER — Ambulatory Visit: Payer: Medicare (Managed Care) | Admitting: Neurology

## 2023-06-06 ENCOUNTER — Encounter: Payer: Self-pay | Admitting: Neurology

## 2023-06-06 VITALS — BP 114/73 | HR 78 | Ht 64.0 in | Wt 278.5 lb

## 2023-06-06 DIAGNOSIS — G35 Multiple sclerosis: Secondary | ICD-10-CM | POA: Diagnosis not present

## 2023-06-06 DIAGNOSIS — L408 Other psoriasis: Secondary | ICD-10-CM

## 2023-06-06 MED ORDER — FINGOLIMOD HCL 0.5 MG PO CAPS: 0.5000 mg | ORAL_CAPSULE | Freq: Every day | ORAL | 11 refills | Status: DC

## 2023-06-06 MED ORDER — TOPIRAMATE 100 MG PO TABS: ORAL_TABLET | ORAL | 3 refills | Status: DC

## 2023-06-06 NOTE — Patient Instructions (Signed)
Great to see you today.  We will continue current medications.  Please go to [facility] in [facility] to have routine labs checked.

## 2023-06-11 NOTE — Telephone Encounter (Signed)
-----   Message from Glean Salvo sent at 06/11/2023  5:39 AM EDT ----- Please remind patient have labs done at [facility] or come back to our office for labs. Thanks

## 2023-06-11 NOTE — Telephone Encounter (Signed)
Call to patient to remind about having labs drawn, no answer. Left message to return call to office.

## 2023-06-18 ENCOUNTER — Telehealth: Payer: Self-pay

## 2023-06-18 ENCOUNTER — Other Ambulatory Visit: Payer: Self-pay

## 2023-06-18 NOTE — Telephone Encounter (Signed)
-----  Message from Glean Salvo sent at 06/11/2023  5:39 AM EDT ----- Please remind patient have labs done at [facility] or come back to our office for labs. Thanks

## 2023-06-18 NOTE — Telephone Encounter (Signed)
My chart message sent regarding lab work needed

## 2023-06-18 NOTE — Telephone Encounter (Signed)
Call to patient, she states she had lab work done 06/10/23 at [facility] medical center and has not heard back about her result and I am unable to find them. Advised that we would reach out for result but she may need to repeat labs if unable to locate results. She verbalized understanding.

## 2023-06-20 ENCOUNTER — Telehealth: Payer: Self-pay | Admitting: Neurology

## 2023-06-20 DIAGNOSIS — G35 Multiple sclerosis: Secondary | ICD-10-CM

## 2023-06-20 NOTE — Telephone Encounter (Signed)
Labs from PCP 06/10/2023 creatinine 1.1, AST 18, ALT 8, cholesterol 174, LDL 107.  Vitamin D 34.29.  A1c 5.1, TSH 4.805.  B12 267, WBC 4.9, absolute lymphocyte 6.2% (low).   Given low absol lymph, please ask he to come by our office to recheck her CBC with diff. Thanks  Orders Placed This Encounter  Procedures   CBC with Differential/Platelet    Standing Status:   Future    Standing Expiration Date:   06/19/2024

## 2023-06-20 NOTE — Telephone Encounter (Signed)
Call to patient, reviewed results and recommedations. she agrees to have blood drawn will go to a [facility] in [place] due to transportation.

## 2023-08-16 ENCOUNTER — Other Ambulatory Visit: Payer: Self-pay | Admitting: Neurology

## 2023-11-19 ENCOUNTER — Telehealth: Payer: Self-pay

## 2023-11-19 NOTE — Telephone Encounter (Signed)
 Called pt and got her rescheduled for her 12/10/23 appointment due to Provider being out of the office. Pt is scheduled for Tomorrow 12/18/2023 @ 11:30am.

## 2023-11-20 ENCOUNTER — Telehealth (INDEPENDENT_AMBULATORY_CARE_PROVIDER_SITE_OTHER): Payer: Medicare PPO | Admitting: Neurology

## 2023-11-20 ENCOUNTER — Telehealth: Payer: Self-pay

## 2023-11-20 DIAGNOSIS — F32A Depression, unspecified: Secondary | ICD-10-CM

## 2023-11-20 DIAGNOSIS — G43709 Chronic migraine without aura, not intractable, without status migrainosus: Secondary | ICD-10-CM | POA: Diagnosis not present

## 2023-11-20 DIAGNOSIS — F419 Anxiety disorder, unspecified: Secondary | ICD-10-CM | POA: Diagnosis not present

## 2023-11-20 DIAGNOSIS — G35 Multiple sclerosis: Secondary | ICD-10-CM | POA: Diagnosis not present

## 2023-11-20 DIAGNOSIS — G8929 Other chronic pain: Secondary | ICD-10-CM

## 2023-11-20 NOTE — Telephone Encounter (Signed)
 Call to patient no answer left message to remind of mychart visit and attempt to review medications and history.

## 2023-11-20 NOTE — Progress Notes (Signed)
 ASSESSMENT AND PLAN 53 y.o. year old female   [name] On Gilenya since August 2011, I had multiple discussions about stopping immunomodulation therapy given stable clinical and imaging course, patient desires to continue on Gilenya, today introduced her Ashok Cordia, she will look into that and let me know if she is waiting to switch Most recent MRI of the brain with and without contrast in April, May 2024 showed good MS stability, no exacerbation or new lesions  Chronic migraine headaches -Under good control  -Continue Topamax 100 mg at bedtime, use Imitrex as needed   Depression, anxiety, chronic pain Stable, on Prozac 40 mg daily from [place], gets Lyrica, Oxycodone 150 tablets monthly  She will let me know about her decision of aubagio,  DIAGNOSTIC DATA (LABS, IMAGING, TESTING) - I reviewed patient records, labs, notes, testing and imaging myself where available.      HISTORY [name] is a 53 year-old right-handed [place] American single female from [place], [place], she was diagnosed with multiple sclerosis 10/2004 with positive MRI of the brain and 6 oligoclonal bands in CSF. She was patient of Dr. Sandria Manly. She was begun on Avonex. 03/12/2006 she had new cervical medullary lesion and was changed to rebif. She is in the EPOC study by Dr. Jodi Mourning, has been treated with Gilenya since 05/24/10. She is not having any side effects from the medication. She had a normal examination at the [facility] specialists clinic checking for macular edema on 08/17/10.     06/25/11 MRI of the brain and cervical spine showed a few scattered supratentorial nonspecific foci of T2 hyperintensity without enhancement and no change versus 04/19/09. There was disc bulging at C5-6 and C6-7 without cord lesions present and no change versus 06/16/08.    She also has hypothyroidism, diabetes, obstructive sleep apnea She is using CPAP At 13 cm of water with ESS 3. She had pulmonary  function tests because of decreased breath sounds which were normal. 07/08/2012 she awoke with nausea and shaking of her right hand and arm, and inability to speak. Her right leg "did not want to go". The following day her speech was hesitant but well-formed. She was treated by iv followed by po steroid.    Update 06/29/21 Dr. [name]: She is overall stable, no flareup of MS symptoms, we again discussed the possibility of starting long-term immunomodulation therapy, due to her stable symptoms, minimum findings on multiple repeat MRI of the brain with without contrast, I personally reviewed most recent MRI of the brain in October 2020, only few scattered T2/FLAIR hyperdensity, no significant change, MRI cervical spine showed no cord involvement, she is on Gilenya,  Continue has migraine headache couple times each week, responding well to Topamax every night as preventive medication, Imitrex 50 mg as needed, continue have gait abnormality, also limited by her big body habitus, chronic low back pain,    Virtual Visit via video November 20, 2023 I discussed the limitations of evaluation and management by telemedicine and the availability of in person appointments. The patient expressed understanding and agreed to proceed  Location: Provider: [facility] office; Patient: Home  I connected with [name] 11/20/2023 by a video enabled telemedicine application and verified that I am speaking with the correct person using two identifiers.  UPDATED HiSTORY She is doing well, no recurrent new symptoms normal speech today, personally reviewed and shared MRI of brain with without contrast Feb 18, 2023, only few scattered T2 periventricular white matter signal  abnormality, no contrast-enhancement, no change compared to previous scan  Her migraine is overall under good control, also on medication for depression anxiety,  Given her benign course, minimal abnormality on the MRI of the brain, I again suggest her to  stop Gilenya, even offered to switch her to [place], she will go over information   Observations/Objective: I have reviewed problem lists, medications, allergies. Awake, alert, oriented to history taking and care conversation, facial symmetric, no dysarthria, no aphasia, moving 4 extremity without difficulties.  REVIEW OF SYSTEMS: Out of a complete 14 system review of symptoms, the patient complains only of the following symptoms, and all other reviewed systems are negative.  See HPI  ALLERGIES: No Known Allergies  HOME MEDICATIONS: Outpatient Medications Prior to Visit  Medication Sig Dispense Refill   amLODipine (NORVASC) 10 MG tablet Take 1 tablet by mouth daily.  2   baclofen (LIORESAL) 20 MG tablet Take 1 tablet (20 mg total) by mouth 3 (three) times daily. 270 tablet 3   cetirizine (ZYRTEC) 10 MG tablet Take 10 mg by mouth daily.     Cholecalciferol (VITAMIN D) 2000 units CAPS Take 1 capsule by mouth daily. (Patient not taking: Reported on 06/06/2023)     docusate sodium (COLACE) 100 MG capsule Take 100 mg by mouth 2 (two) times daily.      ferrous sulfate 325 (65 FE) MG tablet Take 325 mg by mouth daily with breakfast.     Fingolimod HCl 0.5 MG CAPS Take 1 capsule (0.5 mg total) by mouth daily. 30 capsule 11   FLUoxetine (PROZAC) 40 MG capsule TAKE 1 CAPSULE(40 MG) BY MOUTH DAILY 90 capsule 3   fluticasone (FLONASE) 50 MCG/ACT nasal spray Place 2 sprays into the nose daily.     hydrochlorothiazide (HYDRODIURIL) 12.5 MG tablet TK 1 T PO D  2   ketoconazole (NIZORAL) 2 % cream Apply 1 application topically as needed.  (Patient not taking: Reported on 06/06/2023)     levothyroxine (SYNTHROID) 150 MCG tablet Take 150 mcg by mouth daily.     LYRICA 200 MG capsule TK ONE C PO HS  2   meloxicam (MOBIC) 15 MG tablet Take 15 mg by mouth daily as needed for pain.     Multiple Vitamins-Minerals (MULTIVITAMIN PO) Take 1 tablet by mouth daily.      omeprazole (PRILOSEC) 40 MG capsule TK 1 C PO D      ondansetron (ZOFRAN ODT) 4 MG disintegrating tablet Take 1 tablet (4 mg total) by mouth every 8 (eight) hours as needed. 20 tablet 6   oxyCODONE (OXY IR/ROXICODONE) 5 MG immediate release tablet Take 10 mg by mouth every 6 (six) hours as needed for severe pain.     oxyCODONE-acetaminophen (PERCOCET) 7.5-325 MG per tablet Take 1 tablet by mouth 3 (three) times daily. (Patient not taking: Reported on 06/06/2023)     polyethylene glycol (MIRALAX / GLYCOLAX) packet Take 17 g by mouth daily as needed.      SUMAtriptan (IMITREX) 50 MG tablet Take 1 tablet (50 mg total) by mouth every 2 (two) hours as needed for migraine. May repeat in 2 hours if headache persists or recurs. 12 tablet 5   topiramate (TOPAMAX) 100 MG tablet TAKE 1 TABLET(100 MG) BY MOUTH AT BEDTIME 90 tablet 3   Vitamin D, Ergocalciferol, (DRISDOL) 1.25 MG (50000 UNIT) CAPS capsule Take 50,000 Units by mouth every 7 (seven) days.     No facility-administered medications prior to visit.    PAST MEDICAL  HISTORY: Past Medical History:  Diagnosis Date   Acid reflux    Diabetes mellitus without complication (HCC)    states she was "prediabetic" at one time   Fibromyalgia    MS (multiple sclerosis) (HCC)    Sleep apnea    Thyroid disease     PAST SURGICAL HISTORY: Past Surgical History:  Procedure Laterality Date   ENDOMETRIAL BIOPSY     HERNIA REPAIR      FAMILY HISTORY: Family History  Problem Relation Age of Onset   Hypertension Mother    Migraines Mother    High blood pressure Father    Lung cancer Father     SOCIAL HISTORY: Social History   Socioeconomic History   Marital status: Divorced    Spouse name: Not on file   Number of children: 1   Years of education: college   Highest education level: Not on file  Occupational History    Employer: UNEMPLOYED    Comment: disabled  Tobacco Use   Smoking status: Former    Current packs/day: 0.00    Types: Cigarettes    Quit date: 10/01/1988    Years since  quitting: 35.1   Smokeless tobacco: Never  Vaping Use   Vaping status: Never Used  Substance and Sexual Activity   Alcohol use: No    Alcohol/week: 0.0 standard drinks of alcohol    Comment: Quit alcohol consumption in 1990's   Drug use: No   Sexual activity: Not on file  Other Topics Concern   Not on file  Social History Narrative   She lives with her mother, daughter, she is on disability.    Right handed.   Education college.   Caffeine soda 2 daily   Social Drivers of Corporate investment banker Strain: Not on file  Food Insecurity: Not on file  Transportation Needs: Not on file  Physical Activity: Not on file  Stress: Not on file  Social Connections: Not on file  Intimate Partner Violence: Not on file    PHYSICAL EXAM  There were no vitals filed for this visit.   There is no height or weight on file to calculate BMI.  Generalized: Well developed, in no acute distress  Neurological examination  Mentation: Alert oriented to time, place, history taking. Follows all commands speech and language fluent.  Speech is clear today, no accent, is positive and upbeat  Cranial nerve II-XII: Pupils were equal round reactive to light. Extraocular movements were full, visual field were full on confrontational test. Facial sensation and strength were normal. Head turning and shoulder shrug  were normal and symmetric. Motor: The motor testing reveals 5 over 5 strength of all 4 extremities. Good symmetric motor tone is noted throughout.  Sensory: Sensory testing is intact to soft touch on all 4 extremities. No evidence of extinction is noted.  Coordination: Cerebellar testing reveals good finger-nose-finger and heel-to-shin bilaterally, but is slightly hard to lift legs Gait and station: Gait is cautious, limited due to large body habitus, stooped posture, legs in flexion at the knee, uses single-point cane Reflexes: Deep tendon reflexes are symmetric

## 2023-12-10 ENCOUNTER — Ambulatory Visit: Payer: Medicare (Managed Care) | Admitting: Neurology

## 2024-01-13 ENCOUNTER — Other Ambulatory Visit: Payer: Self-pay

## 2024-01-13 MED ORDER — BACLOFEN 20 MG PO TABS: 20.0000 mg | ORAL_TABLET | Freq: Three times a day (TID) | ORAL | 3 refills | Status: AC

## 2024-02-01 ENCOUNTER — Other Ambulatory Visit: Payer: Self-pay | Admitting: Neurology

## 2024-03-02 ENCOUNTER — Other Ambulatory Visit: Payer: Self-pay

## 2024-03-02 MED ORDER — FLUOXETINE HCL 40 MG PO CAPS: ORAL_CAPSULE | ORAL | 3 refills | Status: AC

## 2024-05-08 ENCOUNTER — Telehealth: Payer: Self-pay

## 2024-05-08 ENCOUNTER — Emergency Department (HOSPITAL_BASED_OUTPATIENT_CLINIC_OR_DEPARTMENT_OTHER)

## 2024-05-08 ENCOUNTER — Other Ambulatory Visit: Payer: Self-pay

## 2024-05-08 ENCOUNTER — Emergency Department (HOSPITAL_BASED_OUTPATIENT_CLINIC_OR_DEPARTMENT_OTHER)
Admission: EM | Admit: 2024-05-08 | Discharge: 2024-05-08 | Disposition: A | Discharge status: Home / Self Care | Attending: Emergency Medicine | Admitting: Emergency Medicine

## 2024-05-08 ENCOUNTER — Encounter (HOSPITAL_BASED_OUTPATIENT_CLINIC_OR_DEPARTMENT_OTHER): Payer: Self-pay

## 2024-05-08 ENCOUNTER — Telehealth (INDEPENDENT_AMBULATORY_CARE_PROVIDER_SITE_OTHER): Admitting: Neurology

## 2024-05-08 DIAGNOSIS — R251 Tremor, unspecified: Secondary | ICD-10-CM

## 2024-05-08 DIAGNOSIS — G43709 Chronic migraine without aura, not intractable, without status migrainosus: Secondary | ICD-10-CM | POA: Diagnosis not present

## 2024-05-08 DIAGNOSIS — E119 Type 2 diabetes mellitus without complications: Secondary | ICD-10-CM | POA: Insufficient documentation

## 2024-05-08 DIAGNOSIS — F809 Developmental disorder of speech and language, unspecified: Secondary | ICD-10-CM | POA: Diagnosis not present

## 2024-05-08 DIAGNOSIS — M25551 Pain in right hip: Secondary | ICD-10-CM | POA: Insufficient documentation

## 2024-05-08 DIAGNOSIS — R4789 Other speech disturbances: Secondary | ICD-10-CM | POA: Insufficient documentation

## 2024-05-08 DIAGNOSIS — Y92410 Unspecified street and highway as the place of occurrence of the external cause: Secondary | ICD-10-CM | POA: Diagnosis not present

## 2024-05-08 DIAGNOSIS — G35 Multiple sclerosis: Secondary | ICD-10-CM

## 2024-05-08 DIAGNOSIS — M7918 Myalgia, other site: Secondary | ICD-10-CM

## 2024-05-08 LAB — CBC WITH DIFFERENTIAL/PLATELET
Abs Immature Granulocytes: 0.02 K/uL (ref 0.00–0.07)
Basophils Absolute: 0 K/uL (ref 0.0–0.1)
Basophils Relative: 0 %
Eosinophils Absolute: 0 K/uL (ref 0.0–0.5)
Eosinophils Relative: 0 %
HCT: 35.1 % — ABNORMAL LOW (ref 36.0–46.0)
Hemoglobin: 11.2 g/dL — ABNORMAL LOW (ref 12.0–15.0)
Immature Granulocytes: 1 %
Lymphocytes Relative: 9 %
Lymphs Abs: 0.4 K/uL — ABNORMAL LOW (ref 0.7–4.0)
MCH: 27.3 pg (ref 26.0–34.0)
MCHC: 31.9 g/dL (ref 30.0–36.0)
MCV: 85.4 fL (ref 80.0–100.0)
Monocytes Absolute: 0.6 K/uL (ref 0.1–1.0)
Monocytes Relative: 14 %
Neutro Abs: 3.1 K/uL (ref 1.7–7.7)
Neutrophils Relative %: 76 %
Platelets: 187 K/uL (ref 150–400)
RBC: 4.11 MIL/uL (ref 3.87–5.11)
RDW: 13.9 % (ref 11.5–15.5)
WBC: 4.1 K/uL (ref 4.0–10.5)
nRBC: 0 % (ref 0.0–0.2)

## 2024-05-08 LAB — BASIC METABOLIC PANEL WITH GFR
Anion gap: 11 (ref 5–15)
BUN: 16 mg/dL (ref 6–20)
CO2: 23 mmol/L (ref 22–32)
Calcium: 8.7 mg/dL — ABNORMAL LOW (ref 8.9–10.3)
Chloride: 109 mmol/L (ref 98–111)
Creatinine, Ser: 1.07 mg/dL — ABNORMAL HIGH (ref 0.44–1.00)
GFR, Estimated: >60 mL/min (ref >60)
Glucose, Bld: 94 mg/dL (ref 70–99)
Potassium: 3.6 mmol/L (ref 3.5–5.1)
Sodium: 142 mmol/L (ref 135–145)

## 2024-05-08 LAB — CBG MONITORING, ED: Glucose-Capillary: 99 mg/dL (ref 70–99)

## 2024-05-08 MED ORDER — OXYCODONE-ACETAMINOPHEN 5-325 MG PO TABS
1.0000 | ORAL_TABLET | Freq: Once | ORAL | Status: AC
  Administered 2024-05-08: 1 via ORAL
  Filled 2024-05-08: qty 1

## 2024-05-08 NOTE — ED Notes (Signed)
 Discharge paperwork reviewed entirely with patient, including follow up care. Pain was under control. No prescriptions were called in, but all questions were addressed.  Pt verbalized understanding as well as all parties involved. No questions or concerns voiced at the time of discharge. No acute distress noted. Pt was encouraged to stay adequately hydrated and eat a healthy diet.   Pt was wheeled out to the PVA in a wheelchair without incident.  Pt advised they will notify their PCP immediately.  The pt was instructed to set up and/or review MyChart for their results; and was informed their Providers all have access to the information as well.
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
## 2024-05-18 ENCOUNTER — Other Ambulatory Visit: Payer: Self-pay

## 2024-05-18 DIAGNOSIS — L408 Other psoriasis: Secondary | ICD-10-CM

## 2024-05-18 DIAGNOSIS — G35 Multiple sclerosis: Secondary | ICD-10-CM

## 2024-05-18 MED ORDER — FINGOLIMOD HCL 0.5 MG PO CAPS: 0.5000 mg | ORAL_CAPSULE | Freq: Every day | ORAL | 11 refills | Status: DC

## 2024-06-04 ENCOUNTER — Encounter: Payer: Self-pay | Admitting: Neurology

## 2024-06-04 ENCOUNTER — Ambulatory Visit (INDEPENDENT_AMBULATORY_CARE_PROVIDER_SITE_OTHER): Payer: Medicare PPO | Admitting: Neurology

## 2024-06-04 VITALS — BP 160/78 | HR 60 | Ht 63.0 in | Wt 279.0 lb

## 2024-06-04 DIAGNOSIS — F419 Anxiety disorder, unspecified: Secondary | ICD-10-CM

## 2024-06-04 DIAGNOSIS — G35 Multiple sclerosis: Secondary | ICD-10-CM

## 2024-06-04 DIAGNOSIS — G43709 Chronic migraine without aura, not intractable, without status migrainosus: Secondary | ICD-10-CM

## 2024-06-04 DIAGNOSIS — R269 Unspecified abnormalities of gait and mobility: Secondary | ICD-10-CM

## 2024-06-04 MED ORDER — TOPIRAMATE 100 MG PO TABS: ORAL_TABLET | ORAL | 3 refills | Status: AC

## 2024-06-04 NOTE — Patient Instructions (Signed)
 For now, continue Gilenya .  We are discussing switching to Aubagio.  Please review information I provided.  Check labs today.  Continue current medications.  Recommend exercise, activity, weight loss.  Will follow-up in 6 months.  If we switch to Aubagio will need to do monthly lab work.  Thanks
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
## 2024-06-05 NOTE — Progress Notes (Signed)
 Chart reviewed, agree above plan ?

## 2024-06-08 LAB — CBC WITH DIFFERENTIAL/PLATELET
Basophils Absolute: 0 x10E3/uL (ref 0.0–0.2)
Basos: 0 %
EOS (ABSOLUTE): 0 x10E3/uL (ref 0.0–0.4)
Eos: 0 %
Hematocrit: 40 % (ref 34.0–46.6)
Hemoglobin: 12.6 g/dL (ref 11.1–15.9)
Immature Grans (Abs): 0 x10E3/uL (ref 0.0–0.1)
Immature Granulocytes: 0 %
Lymphocytes Absolute: 0.6 x10E3/uL — ABNORMAL LOW (ref 0.7–3.1)
Lymphs: 10 %
MCH: 27.4 pg (ref 26.6–33.0)
MCHC: 31.5 g/dL (ref 31.5–35.7)
MCV: 87 fL (ref 79–97)
Monocytes Absolute: 0.5 x10E3/uL (ref 0.1–0.9)
Monocytes: 9 %
Neutrophils Absolute: 4.9 x10E3/uL (ref 1.4–7.0)
Neutrophils: 81 %
Platelets: 260 x10E3/uL (ref 150–450)
RBC: 4.6 x10E6/uL (ref 3.77–5.28)
RDW: 13.5 % (ref 11.7–15.4)
WBC: 6 x10E3/uL (ref 3.4–10.8)

## 2024-06-08 LAB — COMPREHENSIVE METABOLIC PANEL WITH GFR
ALT: 15 IU/L (ref 0–32)
AST: 26 IU/L (ref 0–40)
Albumin: 4.3 g/dL (ref 3.8–4.9)
Alkaline Phosphatase: 107 IU/L (ref 44–121)
BUN/Creatinine Ratio: 12 (ref 9–23)
BUN: 14 mg/dL (ref 6–24)
Bilirubin Total: 0.6 mg/dL (ref 0.0–1.2)
CO2: 19 mmol/L — ABNORMAL LOW (ref 20–29)
Calcium: 9 mg/dL (ref 8.7–10.2)
Chloride: 106 mmol/L (ref 96–106)
Creatinine, Ser: 1.13 mg/dL — ABNORMAL HIGH (ref 0.57–1.00)
Globulin, Total: 2.7 g/dL (ref 1.5–4.5)
Glucose: 121 mg/dL — ABNORMAL HIGH (ref 70–99)
Potassium: 4.4 mmol/L (ref 3.5–5.2)
Sodium: 143 mmol/L (ref 134–144)
Total Protein: 7 g/dL (ref 6.0–8.5)
eGFR: 59 mL/min/1.73 — ABNORMAL LOW (ref >59)

## 2024-06-08 LAB — QUANTIFERON-TB GOLD PLUS
QuantiFERON Mitogen Value: 1.89 [IU]/mL
QuantiFERON Nil Value: 0.1 [IU]/mL
QuantiFERON TB1 Ag Value: 0.12 [IU]/mL
QuantiFERON TB2 Ag Value: 0.14 [IU]/mL

## 2024-06-09 ENCOUNTER — Telehealth: Payer: Self-pay | Admitting: Neurology

## 2024-06-09 MED ORDER — TERIFLUNOMIDE 14 MG PO TABS: 14.0000 mg | ORAL_TABLET | Freq: Every day | ORAL | 11 refills | Status: AC

## 2024-06-09 NOTE — Telephone Encounter (Signed)
 I have sent the order to her speciality pharmacy, will have to await coverage. Continue Gilenya  for now. However, plan will be to stop gilenya  for 2 months then start the Aubagio . Thanks  Meds ordered this encounter  Medications   Teriflunomide  14 MG TABS    Sig: Take 1 tablet (14 mg total) by mouth daily.    Dispense:  30 tablet    Refill:  11

## 2024-06-09 NOTE — Telephone Encounter (Signed)
 Called and spoke to pt and went over lab results. Pt used [facility]. She was told to continue gilenya  for now until we get a plan established. Pt voiced understanding of all discussed and to expect a callback for the actual plan.

## 2024-06-09 NOTE — Telephone Encounter (Signed)
 Pt informed to continue gilenya  for now and we eill call and inform when we stop to wash out period. Pt voiced gratitude and understanding of all discussed

## 2024-06-09 NOTE — Addendum Note (Signed)
 Addended by: GAYLAND LAURAINE PARAS on: 06/09/2024 03:03 PM   Modules accepted: Orders

## 2024-06-09 NOTE — Telephone Encounter (Signed)
 Labs look overall stable, mild increase in creatinine 1.13. If patient is agreeable to switch to Aubagio , we can work on the switch. Can you call and see which speciality pharmacy she uses so we can send Aubagio ? 2nd option will be to send through [facility] and she can pay the cost. For now continue Gilenya  until we get a plan. Thanks

## 2024-06-22 NOTE — Telephone Encounter (Signed)
 M[facility] from [facility] called to request to speak to Nurse about Medication order . [facility] is requesting [facility] on medication order  Teriflunomide  14 MG TABS   Callback number is [phone]

## 2024-06-22 NOTE — Telephone Encounter (Signed)
 Returned call to [facility] as requested in regards to fingolimod  and they stated that they needed the pt to call them back. So I 3 way called the pt with the [facility] on the line. Pt stated that she hasn't had the wash out period of the fingoimod ( 2 weeks according to slack's note on 06/04/24) so we were able to speak to pharmacist and tell them to stop the fingolimod  prescription and awaiting 2 weeks before shipping the teriflunomide . Pt voiced gratitude and understanding of all discussed

## 2024-06-23 NOTE — Telephone Encounter (Signed)
 Called and spoke to pt and was able to answer all questions regarding changing medications. Pt voiced gratitude and understanding to stop gilenya  for 2 weeks then start the  Teriflunomide  14 MG TABS

## 2024-06-23 NOTE — Telephone Encounter (Signed)
 Pt called to request  a call from  Nurse about mediation  changed , Pt would like a call to discuss medication . Pt states she know she spoke to Nurse yesterday but she have other concern about the change

## 2024-08-18 ENCOUNTER — Telehealth: Payer: Self-pay | Admitting: Neurology

## 2024-08-18 NOTE — Telephone Encounter (Signed)
 Pt dropped off forms and $50 fee, wants completed forms faxed when ready

## 2024-08-19 ENCOUNTER — Telehealth: Payer: Self-pay | Admitting: *Deleted

## 2024-08-19 DIAGNOSIS — G35D Multiple sclerosis, unspecified: Secondary | ICD-10-CM

## 2024-08-19 DIAGNOSIS — Z0289 Encounter for other administrative examinations: Secondary | ICD-10-CM

## 2024-08-19 NOTE — Telephone Encounter (Signed)
 Josette, can you follow and make sure pt gets set up for labs? Thank you  Lauraine switched pt from Gilenya  to teriflunomide . Sarah LVM this am for pt to call to confirm she started. We need to set up monthly LFT's x6 months.

## 2024-08-19 NOTE — Addendum Note (Signed)
 Addended by: JOSHUA MAURILIO CROME on: 08/19/2024 09:11 AM   Modules accepted: Orders

## 2024-08-19 NOTE — Telephone Encounter (Signed)
 Reviewed forms with Lauraine. Ok to complete for MS,migraines, mood disorder, muscle spasms, gait abnormality.

## 2024-08-19 NOTE — Telephone Encounter (Signed)
 Pt [place] form faxed on 08/19/2024

## 2024-08-19 NOTE — Telephone Encounter (Signed)
 Gave completed/signed form to Debra/medical records to process for pt.

## 2024-08-19 NOTE — Telephone Encounter (Signed)
 Form completed and placed in SS,NP office for signature.

## 2024-09-01 NOTE — Telephone Encounter (Signed)
 I called patient to discuss.  It is unclear whether she has started the teriflunomide .  If so, we will need to set up once monthly LFTs for the first 6 months on teriflunomide .  No answer, left a voicemail asking her to call us  back.

## 2024-09-01 NOTE — Telephone Encounter (Signed)
 Pt called returning call   Informed Pt of Nurse note  and Pt stated that she has started medication  and will wait on call back from nurse

## 2024-09-02 NOTE — Telephone Encounter (Signed)
 Pt has called back and her start date was 10-07.  Also pt confirmed that it will be more agreeable for her to go to a local [facility] for needed labs.  Pt welcomes a call back if needed.

## 2024-09-02 NOTE — Telephone Encounter (Signed)
 I called patient again to discuss.  No answer, left a voicemail asking her to call us  back.  If patient returns call, please ask her when she started the teriflunomide .  She will need once monthly lab work.  Please schedule this on the [facility] lab schedule with the patient.  If she cannot come by [facility] once monthly for once monthly lab work for the next 6 months, she can also try a local [facility].

## 2024-09-04 ENCOUNTER — Other Ambulatory Visit: Payer: Self-pay

## 2024-09-04 DIAGNOSIS — G35D Multiple sclerosis, unspecified: Secondary | ICD-10-CM

## 2024-09-04 NOTE — Addendum Note (Signed)
 Addended by: MINGO NEPTUNE A on: 09/04/2024 09:57 AM   Modules accepted: Orders

## 2024-09-04 NOTE — Telephone Encounter (Signed)
 If patient started teriflunomide  in October, she is definitely due for lab work this month.  LFT order has been placed and released to [facility].    I called patient to discuss.  No answer, left a voicemail asking her to call us  back.  Patient will need to stop by a [facility] soon as convenient to draw this lab.  She will need it once monthly for the next 6 months.  If patient returns my call please explain this to her and encourage her to proceed to her local [facility].  The order is already in.  Please let me know if she has questions or concerns.

## 2024-09-09 NOTE — Telephone Encounter (Signed)
 I called patient.  She will stop by her local [facility] in [place] this Friday.  I advised her that if [facility] cannot find the order to call us  and we can fax it to them.  However, the order has been released, they should be able to see it.

## 2024-09-15 ENCOUNTER — Telehealth: Payer: Self-pay | Admitting: Neurology

## 2024-09-15 ENCOUNTER — Other Ambulatory Visit: Payer: Self-pay | Admitting: *Deleted

## 2024-09-15 DIAGNOSIS — G35D Multiple sclerosis, unspecified: Secondary | ICD-10-CM

## 2024-09-15 NOTE — Telephone Encounter (Signed)
 I called pt.  She started her generic Teriflunomide  14mg  daily 07-07-2024. She has not done labs yet. Will do this Thursday when out for PT. She has noted the last 2 weeks limb weakening/ floppiness.  Her PT did notice this as well yesterday.  She saw her pcp last wk , all ok.  She did note to me some R hand sporadic tremor and sporadic speech (foreign) (I did not hear this).  Some urine incontinence. She questions MS exac.  She has not had one in a long time.

## 2024-09-15 NOTE — Telephone Encounter (Signed)
 I called the patient. Check MRI brain, going tomorrow to get LFT on Aubagio . Unclear if exacerbation? Symptoms have fluctuated historically. Describes all 4 limbs feel numb for 2 weeks. No recent illness. Is not falling. She is not concerned for UTI.

## 2024-09-15 NOTE — Telephone Encounter (Signed)
 Patient said, having reaction to Teriflunomide  14 MG TABS. Have notice motor skills are not the same for two weeks.Have some breakthrough tremors.SABRA

## 2024-09-16 ENCOUNTER — Telehealth: Payer: Self-pay | Admitting: Neurology

## 2024-09-16 NOTE — Telephone Encounter (Signed)
MRI order sent to [facility] [phone]

## 2024-09-17 ENCOUNTER — Ambulatory Visit: Payer: Self-pay | Admitting: Neurology

## 2024-09-17 LAB — HEPATIC FUNCTION PANEL
ALT: 23 IU/L (ref 0–32)
AST: 39 IU/L (ref 0–40)
Albumin: 4.3 g/dL (ref 3.8–4.9)
Alkaline Phosphatase: 85 IU/L (ref 49–135)
Bilirubin Total: 0.6 mg/dL (ref 0.0–1.2)
Bilirubin, Direct: 0.24 mg/dL (ref 0.00–0.40)
Total Protein: 7 g/dL (ref 6.0–8.5)

## 2024-10-19 NOTE — Telephone Encounter (Signed)
 Please call, patient needs labs for Aubagio . Has been on since October. Hepatic function panel.

## 2024-10-19 NOTE — Telephone Encounter (Signed)
 Called and LVM for pt to come in and get her labs done. Pt is needing her Hepatic Function Panel done for her Aubagio .

## 2025-01-19 ENCOUNTER — Ambulatory Visit: Admitting: Neurology
